# Patient Record
Sex: Female | Born: 1990 | Race: Black or African American | Hispanic: No | Marital: Single | Smoking: Former smoker
Health system: Southern US, Community
[De-identification: ages and names within clinical notes are randomized; demographics above are authoritative.]

## PROBLEM LIST (undated history)

## (undated) ENCOUNTER — Inpatient Hospital Stay (HOSPITAL_COMMUNITY): Payer: Self-pay

## (undated) DIAGNOSIS — N189 Chronic kidney disease, unspecified: Secondary | ICD-10-CM

## (undated) DIAGNOSIS — A599 Trichomoniasis, unspecified: Secondary | ICD-10-CM

## (undated) DIAGNOSIS — O2300 Infections of kidney in pregnancy, unspecified trimester: Secondary | ICD-10-CM

## (undated) DIAGNOSIS — B009 Herpesviral infection, unspecified: Secondary | ICD-10-CM

## (undated) HISTORY — PX: INDUCED ABORTION: SHX677

## (undated) HISTORY — DX: Herpesviral infection, unspecified: B00.9

---
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
## 2010-11-21 LAB — POCT RAPID STREP A (OFFICE): Streptococcus, Group A Screen (Direct): POSITIVE — AB

## 2010-12-29 NOTE — H&P (Signed)
NAME:  Jasmine Hudson, Jasmine Hudson NO.:  [id number]   MEDICAL RECORD NO.:  [id number]          PATIENT TYPE:  INP   LOCATION:  9172                          [facility]ILITY:  [facility]   PHYSICIAN:  Roseanna Rainbow, M.D.DATE OF BIRTH:  1990-12-13   DATE OF ADMISSION:  11/07/2005  DATE OF DISCHARGE:                                HISTORY & PHYSICAL   CHIEF COMPLAINT:  The patient is a 20 year old gravida 1, para 0 with an  estimated date of confinement of March 18 with an intrauterine pregnancy at  41+ weeks and elevated blood pressures for induction of labor.   HISTORY OF PRESENT ILLNESS:  Please see the above.  The patient denied any  neurological symptoms.   ALLERGIES:  No known drug allergies.   MEDICATIONS:  Prenatal vitamins.   RISK FACTORS:  Adolescent and she is GBS positive.   LABORATORIES:  Urine culture and sensitivity no growth.  One-hour GTT 93.  GBS positive on February 17.  Hepatitis B surface antigen negative.  Hemoglobin 11.8, hematocrit 35.1.  HIV nonreactive.  Platelets 301,000.  Blood type A+.  Antibody screen negative.  RPR nonreactive.  Rubella immune.  Sickle cell negative.  Ultrasound on February 6:  Estimated fetal weight  percentile 75th-90th percentile for 34 weeks, normal amniotic fluid index,  no previa.   PAST OB/GYN HISTORY:  Noncontributory.   PAST MEDICAL HISTORY:  No significant history of medical diseases.   PAST SURGICAL HISTORY:  No previous surgery.   SOCIAL HISTORY:  Does not give any significant history of alcohol usage.  Has no significant smoking history.  Denies illicit drug use.   FAMILY HISTORY:  Positive history of hypertension.   PHYSICAL EXAMINATION:  VITAL SIGNS:  Blood pressure 144/80, temperature  97.7, pulse 117.  Urine dip:  No proteinuria.  GENERAL:  Well-developed, well-nourished, no apparent distress.  ABDOMEN:  Gravid.  Cephalic presentation by Thayer Ohm.  PELVIC:  Sterile vaginal examination deferred secondary to  patient unable to  tolerate the examination.  Doppler fetal heart tones 130s.   ASSESSMENT:  Primigravida with an intrauterine pregnancy at 41+ weeks.  Rule  out gestational hypertension.  GBS positive.   PLAN:  Admission.  Induction of labor two-stage.  Will start with cervical  ripening.  GBS prophylaxis in labor.  Will also check a PIH panel.      Roseanna Rainbow, M.D.  Electronically Signed     LAJ/MEDQ  D:  11/07/2005  T:  11/07/2005  Job:  [id number]

## 2010-12-29 NOTE — Op Note (Signed)
NAME:  Jasmine Hudson, Jasmine Hudson NO.:  [id number]   MEDICAL RECORD NO.:  [id number]          PATIENT TYPE:  INP   LOCATION:  9101                          [facility]:  [facility]   PHYSICIAN:  Roseanna Rainbow, M.D.DATE OF BIRTH:  Feb 06, 1991   DATE OF PROCEDURE:  11/09/2005  DATE OF DISCHARGE:                                 OPERATIVE REPORT   PREOPERATIVE DIAGNOSIS:  Intrauterine pregnancy at 41+ weeks, protracted  latent phase, failed induction of labor.   POSTOPERATIVE DIAGNOSIS:  Intrauterine pregnancy at 41+ weeks, protracted  latent phase, failed induction of labor.   PROCEDURE:  Primary low uterine flap elliptical cesarean delivery via  Pfannenstiel skin incision.   SURGEON:  Jackson-Moore.   ANESTHESIA:  General tracheal.   COMPLICATIONS:  None.   ESTIMATED BLOOD LOSS:  800 mL.   IV FLUIDS/URINE OUTPUT:  As per anesthesiology.   PROCEDURES:  The patient was taken to the operating room and general  anesthetic was induced without difficulty.  She was then prepped and draped  in usual sterile fashion.  A Pfannenstiel skin incision was then made with  the scalpel and carried down to the underlying fascia with Bovie.  Fascia  was nicked in the midline.  The fascial incision was then extended  bilaterally with curved Mayo scissors.  The superior aspect of the fascial  incision was then tented up and the underlying rectus muscles dissected off.  The inferior aspect of the fascial incision was tented up and the rectus  muscles were dissected off.  The rectus muscles were separated in the  midline.  The parietal peritoneum was entered bluntly.  The peritoneal  incision was then extended superiorly and inferiorly with good visualization  of the bladder.  The bladder blade was then placed.  The vesicouterine  peritoneum was tented up and entered sharply with Metzenbaum scissors.  The  peritoneal incision was then extended bilaterally and the bladder flap  created  bluntly.  The bladder blade was then replaced, the lower uterine  segment was incised in a transverse fashion with the scalpel.  The uterine  incision was then extended with bandage scissors.  The infant's head was  delivered atraumatically.  The oropharynx was suctioned with bulb suction.  The cord was clamped and cut.  The infant was handed off to the waiting  neonatologist.  An umbilical artery pH was sent.  The placenta was then  removed.  The intrauterine cavity was evacuated of any remaining amniotic  fluid, clots and debris with moist laparotomy sponge.  The uterine incision  was then reapproximated in a running interlocking fashion with 0 Monocryl.  A second imbricating layer of the same suture was then placed.  Adequate  hemostasis was noted.  The paracolic gutters were then copiously irrigated.  The parietal peritoneum was reapproximated in a running fashion using 2-0  Vicryl.  The fascia was reapproximated  in a running fashion using 0 Vicryl.  The skin was reapproximated with  staples.  At the close of the procedure, the instrument and pack counts were  said to be correct x2.  The patient was awakened from general anesthesia and  taken to the PACU awake and in stable condition.      Roseanna Rainbow, M.D.  Electronically Signed     LAJ/MEDQ  D:  11/09/2005  T:  11/11/2005  Job:  [id number]
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
## 2011-05-03 LAB — POCT RAPID STREP A: Streptococcus, Group A Screen (Direct): NEGATIVE

## 2011-05-31 LAB — POCT RAPID STREP A: Streptococcus, Group A Screen (Direct): NEGATIVE

## 2011-07-04 ENCOUNTER — Encounter: Payer: Self-pay | Admitting: *Deleted

## 2011-07-04 DIAGNOSIS — S0100XA Unspecified open wound of scalp, initial encounter: Secondary | ICD-10-CM | POA: Insufficient documentation

## 2011-07-04 DIAGNOSIS — S0003XA Contusion of scalp, initial encounter: Secondary | ICD-10-CM | POA: Insufficient documentation

## 2011-07-04 DIAGNOSIS — S1093XA Contusion of unspecified part of neck, initial encounter: Secondary | ICD-10-CM | POA: Insufficient documentation
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
## 2011-07-04 NOTE — ED Notes (Signed)
Bleeding controlled while in triage

## 2011-07-05 ENCOUNTER — Emergency Department (HOSPITAL_BASED_OUTPATIENT_CLINIC_OR_DEPARTMENT_OTHER)
Admission: EM | Admit: 2011-07-05 | Discharge: 2011-07-05 | Disposition: A | Discharge status: Home / Self Care | Payer: Self-pay | Attending: Emergency Medicine | Admitting: Emergency Medicine

## 2011-07-05 DIAGNOSIS — T148XXA Other injury of unspecified body region, initial encounter: Secondary | ICD-10-CM

## 2011-07-05 DIAGNOSIS — S0093XA Contusion of unspecified part of head, initial encounter: Secondary | ICD-10-CM

## 2011-07-05 MED ORDER — TETANUS-DIPHTH-ACELL PERTUSSIS 5-2.5-18.5 LF-MCG/0.5 IM SUSP
0.5000 mL | Freq: Once | INTRAMUSCULAR | Status: DC
  Filled 2011-07-05: qty 0.5

## 2011-07-05 NOTE — ED Notes (Signed)
Pt left prior to receiving Tdap and discharge instructions.
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
## 2011-07-14 ENCOUNTER — Encounter (HOSPITAL_COMMUNITY): Payer: Self-pay | Admitting: *Deleted

## 2011-07-14 ENCOUNTER — Inpatient Hospital Stay (HOSPITAL_COMMUNITY): Payer: Self-pay

## 2011-07-14 ENCOUNTER — Inpatient Hospital Stay (HOSPITAL_COMMUNITY)
Admission: AD | Admit: 2011-07-14 | Discharge: 2011-07-14 | Disposition: A | Discharge status: Home / Self Care | Payer: Self-pay | Source: Ambulatory Visit | Attending: Obstetrics and Gynecology | Admitting: Obstetrics and Gynecology

## 2011-07-14 DIAGNOSIS — R109 Unspecified abdominal pain: Secondary | ICD-10-CM | POA: Insufficient documentation

## 2011-07-14 DIAGNOSIS — Z3490 Encounter for supervision of normal pregnancy, unspecified, unspecified trimester: Secondary | ICD-10-CM

## 2011-07-14 DIAGNOSIS — O2390 Unspecified genitourinary tract infection in pregnancy, unspecified trimester: Secondary | ICD-10-CM | POA: Insufficient documentation

## 2011-07-14 DIAGNOSIS — N390 Urinary tract infection, site not specified: Secondary | ICD-10-CM | POA: Insufficient documentation

## 2011-07-14 LAB — URINE MICROSCOPIC-ADD ON

## 2011-07-14 LAB — URINALYSIS, ROUTINE W REFLEX MICROSCOPIC
Glucose, UA: NEGATIVE mg/dL
Hgb urine dipstick: NEGATIVE
Ketones, ur: >80 mg/dL — AB
Nitrite: POSITIVE — AB
Protein, ur: 30 mg/dL — AB
Specific Gravity, Urine: >1.03 — ABNORMAL HIGH (ref 1.005–1.030)
Urobilinogen, UA: 4 mg/dL — ABNORMAL HIGH (ref 0.0–1.0)
pH: 6 (ref 5.0–8.0)

## 2011-07-14 LAB — CBC
HCT: 39.7 % (ref 36.0–46.0)
Hemoglobin: 13.7 g/dL (ref 12.0–15.0)
MCH: 30.2 pg (ref 26.0–34.0)
MCHC: 34.5 g/dL (ref 30.0–36.0)
MCV: 87.4 fL (ref 78.0–100.0)
Platelets: 292 10*3/uL (ref 150–400)
RBC: 4.54 MIL/uL (ref 3.87–5.11)
WBC: 8.6 10*3/uL (ref 4.0–10.5)

## 2011-07-14 LAB — ABO/RH: ABO/RH(D): A POS

## 2011-07-14 LAB — WET PREP, GENITAL: Yeast Wet Prep HPF POC: NONE SEEN

## 2011-07-14 LAB — HCG, QUANTITATIVE, PREGNANCY: hCG, Beta Chain, Quant, S: 88419 m[IU]/mL — ABNORMAL HIGH (ref <5)

## 2011-07-14 LAB — POCT PREGNANCY, URINE: Preg Test, Ur: POSITIVE

## 2011-07-14 MED ORDER — CEPHALEXIN 500 MG PO CAPS: 500.0000 mg | ORAL_CAPSULE | Freq: Four times a day (QID) | ORAL | Status: AC

## 2011-07-14 NOTE — ED Provider Notes (Signed)
Chief Complaint:  Abdominal Pain   Jasmine Hudson is  20 y.o. G2P1001.  Patient's last menstrual period was 05/27/2011.Marland Kitchen  Her pregnancy status is positive. [redacted]w[redacted]d   She presents complaining of Abdominal Pain . Onset is described as sudden and has been present for  1 days. Denies bleeding, vaginal discharge, back pain, dysuria, fever, chills, N/V/D  Obstetrical/Gynecological History: OB History    Grav Para Term Preterm Abortions TAB SAB Ect Mult Living   2 1 1  0      1      Past Medical History: Past Medical History  Diagnosis Date  . No pertinent past medical history     Past Surgical History: Past Surgical History  Procedure Date  . Cesarean section     Family History: No family history on file.  Social History: History  Substance Use Topics  . Smoking status: Passive Smoker  . Smokeless tobacco: Not on file  . Alcohol Use: No    Allergies: No Known Allergies  No prescriptions prior to admission    Review of Systems - Negative except what has been reviewed in the HPI  Physical Exam   Blood pressure 120/59, pulse 90, temperature 98.2 F (36.8 C), temperature source Oral, resp. rate 16, height 5\' 8"  (1.727 m), weight 83.553 kg (184 lb 3.2 oz), last menstrual period 05/27/2011, SpO2 98.00%.  General: General appearance - alert, well appearing, and in no distress, oriented to person, place, and time, overweight and comfortable appearing Mental status - alert, oriented to person, place, and time, normal mood, behavior, speech, dress, motor activity, and thought processes, affect appropriate to mood Abdomen - suprapubic tenderness Focused Gynecological Exam: VULVA: normal appearing vulva with no masses, tenderness or lesions, VAGINA: normal appearing vagina with normal color and discharge, no lesions, CERVIX: normal appearing cervix without discharge or lesions, UTERUS: uterus is normal shape, consistency and nontender, enlarged to 6 week's size, ADNEXA: normal adnexa  in size, nontender and no masses  Labs: Recent Results (from the past 24 hour(s))  URINALYSIS, ROUTINE W REFLEX MICROSCOPIC   Collection Time   07/14/11  2:35 PM      Component Value Range   Color, Urine YELLOW  YELLOW    APPearance CLOUDY (*) CLEAR    Specific Gravity, Urine >1.030 (*) 1.005 - 1.030    pH 6.0  5.0 - 8.0    Glucose, UA NEGATIVE  NEGATIVE (mg/dL)   Hgb urine dipstick NEGATIVE  NEGATIVE    Bilirubin Urine MODERATE (*) NEGATIVE    Ketones, ur >80 (*) NEGATIVE (mg/dL)   Protein, ur 30 (*) NEGATIVE (mg/dL)   Urobilinogen, UA 4.0 (*) 0.0 - 1.0 (mg/dL)   Nitrite POSITIVE (*) NEGATIVE    Leukocytes, UA TRACE (*) NEGATIVE   URINE MICROSCOPIC-ADD ON   Collection Time   07/14/11  2:35 PM      Component Value Range   Squamous Epithelial / LPF FEW (*) RARE    WBC, UA 0-2  <3 (WBC/hpf)   RBC / HPF 0-2  <3 (RBC/hpf)   Bacteria, UA MANY (*) RARE   POCT PREGNANCY, URINE   Collection Time   07/14/11  2:39 PM      Component Value Range   Preg Test, Ur POSITIVE    ABO/RH   Collection Time   07/14/11  3:00 PM      Component Value Range   ABO/RH(D) A POS    HCG, QUANTITATIVE, PREGNANCY   Collection Time   07/14/11  3:00  PM      Component Value Range   hCG, Beta Nyra Jabs, [place] [address] (*) <5 (mIU/mL)  CBC   Collection Time   07/14/11  3:01 PM      Component Value Range   WBC 8.6  4.0 - 10.5 (K/uL)   RBC 4.54  3.87 - 5.11 (MIL/uL)   Hemoglobin 13.7  12.0 - 15.0 (g/dL)   HCT 60.4  54.0 - 98.1 (%)   MCV 87.4  78.0 - 100.0 (fL)   MCH 30.2  26.0 - 34.0 (pg)   MCHC 34.5  30.0 - 36.0 (g/dL)   RDW 19.1  47.8 - 29.5 (%)   Platelets 292  150 - 400 (K/uL)  WET PREP, GENITAL   Collection Time   07/14/11  4:50 PM      Component Value Range   Yeast, Wet Prep NONE SEEN  NONE SEEN    Trich, Wet Prep NONE SEEN  NONE SEEN    Clue Cells, Wet Prep FEW (*) NONE SEEN    WBC, Wet Prep HPF POC MODERATE (*) NONE SEEN    Imaging Studies:  Viable IUP with cardiac activity    Assessment: Viable IUP UTI  Plan: Discharge home Rx Keflex sent to pharmacy Preg Verification letter given FU with OB/Gyn provider of choice for care  Kellianne Ek E. 07/14/2011,5:08 PM

## 2011-07-14 NOTE — Progress Notes (Signed)
Patient states she had a positive home pregnancy test about 2 weeks ago. Has started having lower abdominal pain. Has nausea and some vomiting. No bleeding.

## 2011-07-17 LAB — GC/CHLAMYDIA PROBE AMP, GENITAL: GC Probe Amp, Genital: NEGATIVE

## 2011-07-19 LAB — URINE CULTURE
Colony Count: >=100000
Culture  Setup Time: 201212012012

## 2011-07-23 NOTE — ED Provider Notes (Signed)
Attestation of Attending Supervision of Advanced Practitioner: Evaluation and management procedures were performed by the PA/NP/CNM/OB Fellow under my supervision/collaboration. Chart reviewed and agree with management and plan.  Kailene Steinhart V 07/23/2011 8:06 AM

## 2011-08-14 DIAGNOSIS — O2300 Infections of kidney in pregnancy, unspecified trimester: Secondary | ICD-10-CM

## 2011-08-14 HISTORY — DX: Infections of kidney in pregnancy, unspecified trimester: O23.00

## 2011-08-18 ENCOUNTER — Inpatient Hospital Stay (HOSPITAL_COMMUNITY)
Admission: AD | Admit: 2011-08-18 | Discharge: 2011-08-18 | Disposition: A | Discharge status: Home / Self Care | Payer: Self-pay | Source: Ambulatory Visit | Attending: Family Medicine | Admitting: Family Medicine

## 2011-08-18 DIAGNOSIS — A7489 Other chlamydial diseases: Secondary | ICD-10-CM

## 2011-08-18 DIAGNOSIS — O98319 Other infections with a predominantly sexual mode of transmission complicating pregnancy, unspecified trimester: Secondary | ICD-10-CM | POA: Insufficient documentation

## 2011-08-18 DIAGNOSIS — A5619 Other chlamydial genitourinary infection: Secondary | ICD-10-CM | POA: Insufficient documentation

## 2011-08-18 DIAGNOSIS — A749 Chlamydial infection, unspecified: Secondary | ICD-10-CM

## 2011-08-18 DIAGNOSIS — N739 Female pelvic inflammatory disease, unspecified: Secondary | ICD-10-CM | POA: Insufficient documentation

## 2011-08-18 MED ORDER — AZITHROMYCIN 1 G PO PACK: 1.0000 | PACK | Freq: Once | ORAL | Status: AC

## 2011-08-18 NOTE — ED Provider Notes (Signed)
Jasmine Hudson y.o.G2P1001 @[redacted]w[redacted]d   SUBJECTIVE  HPI: Positive Chlamydia culture from [facility] visit 07/14/2011. She presents with a certified letter saying  she needs treatment for Chlamydia. Her family member had not given her the message about the culture being positive. Not messing with the partner since Nov 7 but he was tested yesterday at Urgent Care. Waiting for [facility] card to start [facility].   Past Medical History  Diagnosis Date  . No pertinent past medical history    Past Surgical History  Procedure Date  . Cesarean section    History   Social History  . Marital Status: Single    Spouse Name: N/A    Number of Children: N/A  . Years of Education: N/A   Occupational History  . Not on file.   Social History Main Topics  . Smoking status: Passive Smoker  . Smokeless tobacco: Not on file  . Alcohol Use: No  . Drug Use: No  . Sexually Active: Yes   Other Topics Concern  . Not on file   Social History Narrative  . No narrative on file   No current facility-administered medications on file prior to encounter.   No current outpatient prescriptions on file prior to encounter.   No Known Allergies  ROS: Pertinent items in HPI  OBJECTIVE  BP 134/49  Pulse 90  Temp(Src) 98.5 F (36.9 C) (Oral)  Resp 18  Ht 5\' 7"  (1.702 m)  Wt 84.732 kg (186 lb 12.8 oz)  BMI 29.26 kg/m2  LMP 05/27/2011  Gen: NAD ABD: NT, DT FHR 150  ASSESSMENT  P1001 at [redacted]w[redacted]d Chlamydia  PLAN  RX ZIthro ADvised to treat partner

## 2011-08-18 NOTE — ED Notes (Signed)
D.Poe,CNM gave pt RX for chlamydia treatment FHT per doppler obtained.
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
## 2011-08-18 NOTE — ED Provider Notes (Signed)
Chart reviewed and agree with management and plan.  

## 2011-11-15 ENCOUNTER — Encounter (HOSPITAL_COMMUNITY): Payer: Self-pay | Admitting: *Deleted

## 2011-11-15 ENCOUNTER — Inpatient Hospital Stay (HOSPITAL_COMMUNITY)
Admission: AD | Admit: 2011-11-15 | Discharge: 2011-11-15 | Disposition: A | Discharge status: Home / Self Care | Payer: Self-pay | Source: Ambulatory Visit | Attending: Obstetrics & Gynecology | Admitting: Obstetrics & Gynecology

## 2011-11-15 DIAGNOSIS — R10814 Left lower quadrant abdominal tenderness: Secondary | ICD-10-CM

## 2011-11-15 DIAGNOSIS — O34219 Maternal care for unspecified type scar from previous cesarean delivery: Secondary | ICD-10-CM

## 2011-11-15 DIAGNOSIS — N949 Unspecified condition associated with female genital organs and menstrual cycle: Secondary | ICD-10-CM | POA: Insufficient documentation

## 2011-11-15 DIAGNOSIS — R1032 Left lower quadrant pain: Secondary | ICD-10-CM | POA: Insufficient documentation

## 2011-11-15 DIAGNOSIS — R300 Dysuria: Secondary | ICD-10-CM | POA: Insufficient documentation

## 2011-11-15 DIAGNOSIS — O99891 Other specified diseases and conditions complicating pregnancy: Secondary | ICD-10-CM | POA: Insufficient documentation

## 2011-11-15 DIAGNOSIS — O0930 Supervision of pregnancy with insufficient antenatal care, unspecified trimester: Secondary | ICD-10-CM | POA: Insufficient documentation

## 2011-11-15 LAB — WET PREP, GENITAL
Trich, Wet Prep: NONE SEEN
Yeast Wet Prep HPF POC: NONE SEEN

## 2011-11-15 LAB — URINALYSIS, ROUTINE W REFLEX MICROSCOPIC
Bilirubin Urine: NEGATIVE
Glucose, UA: NEGATIVE mg/dL
Hgb urine dipstick: NEGATIVE
Leukocytes, UA: NEGATIVE
Nitrite: POSITIVE — AB
Protein, ur: NEGATIVE mg/dL
Specific Gravity, Urine: 1.02 (ref 1.005–1.030)
Urobilinogen, UA: 0.2 mg/dL (ref 0.0–1.0)
pH: 6.5 (ref 5.0–8.0)

## 2011-11-15 LAB — URINE MICROSCOPIC-ADD ON

## 2011-11-15 MED ORDER — VALACYCLOVIR HCL 500 MG PO TABS
1000.0000 mg | ORAL_TABLET | Freq: Two times a day (BID) | ORAL | Status: DC
  Filled 2011-11-15 (×2): qty 2

## 2011-11-15 MED ORDER — CEPHALEXIN 500 MG PO CAPS
500.0000 mg | ORAL_CAPSULE | Freq: Two times a day (BID) | ORAL | Status: DC
  Filled 2011-11-15 (×2): qty 1
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
## 2011-11-15 NOTE — Discharge Instructions (Signed)
Please take your valcyclovir and Keflex as prescribed. Please call the health department to establish care for you and your baby. Please continue to work on getting approved for [facility]. Please come back if any of your symptoms get worse.

## 2011-11-15 NOTE — MAU Provider Note (Signed)
Patient seen also by me. Agree with note by Dr Margot Ables.

## 2011-11-16 LAB — GC/CHLAMYDIA PROBE AMP, GENITAL
Chlamydia, DNA Probe: NEGATIVE
GC Probe Amp, Genital: NEGATIVE

## 2011-11-19 LAB — HERPES SIMPLEX VIRUS CULTURE

## 2011-11-22 ENCOUNTER — Encounter: Payer: Self-pay | Admitting: Obstetrics & Gynecology

## 2011-11-22 DIAGNOSIS — B009 Herpesviral infection, unspecified: Secondary | ICD-10-CM | POA: Insufficient documentation

## 2011-12-06 ENCOUNTER — Ambulatory Visit (HOSPITAL_COMMUNITY)
Admission: RE | Admit: 2011-12-06 | Discharge: 2011-12-06 | Disposition: A | Discharge status: Home / Self Care | Payer: Self-pay | Source: Ambulatory Visit | Attending: Obstetrics and Gynecology | Admitting: Obstetrics and Gynecology

## 2011-12-06 ENCOUNTER — Ambulatory Visit (INDEPENDENT_AMBULATORY_CARE_PROVIDER_SITE_OTHER): Payer: Self-pay | Admitting: Obstetrics and Gynecology

## 2011-12-06 ENCOUNTER — Encounter: Payer: Self-pay | Admitting: Obstetrics and Gynecology

## 2011-12-06 VITALS — Temp 97.3°F | Wt 212.4 lb

## 2011-12-06 DIAGNOSIS — O98519 Other viral diseases complicating pregnancy, unspecified trimester: Secondary | ICD-10-CM | POA: Insufficient documentation

## 2011-12-06 DIAGNOSIS — A6000 Herpesviral infection of urogenital system, unspecified: Secondary | ICD-10-CM | POA: Insufficient documentation

## 2011-12-06 DIAGNOSIS — O98319 Other infections with a predominantly sexual mode of transmission complicating pregnancy, unspecified trimester: Secondary | ICD-10-CM

## 2011-12-06 DIAGNOSIS — O0930 Supervision of pregnancy with insufficient antenatal care, unspecified trimester: Secondary | ICD-10-CM | POA: Insufficient documentation

## 2011-12-06 DIAGNOSIS — Z3480 Encounter for supervision of other normal pregnancy, unspecified trimester: Secondary | ICD-10-CM

## 2011-12-06 DIAGNOSIS — Z1389 Encounter for screening for other disorder: Secondary | ICD-10-CM | POA: Insufficient documentation

## 2011-12-06 DIAGNOSIS — A749 Chlamydial infection, unspecified: Secondary | ICD-10-CM | POA: Insufficient documentation

## 2011-12-06 DIAGNOSIS — B009 Herpesviral infection, unspecified: Secondary | ICD-10-CM

## 2011-12-06 DIAGNOSIS — O34219 Maternal care for unspecified type scar from previous cesarean delivery: Secondary | ICD-10-CM | POA: Insufficient documentation

## 2011-12-06 DIAGNOSIS — O358XX0 Maternal care for other (suspected) fetal abnormality and damage, not applicable or unspecified: Secondary | ICD-10-CM | POA: Insufficient documentation

## 2011-12-06 DIAGNOSIS — Z363 Encounter for antenatal screening for malformations: Secondary | ICD-10-CM | POA: Insufficient documentation

## 2011-12-06 DIAGNOSIS — A568 Sexually transmitted chlamydial infection of other sites: Secondary | ICD-10-CM

## 2011-12-06 LAB — POCT URINALYSIS DIP (DEVICE)
Glucose, UA: NEGATIVE mg/dL
Hgb urine dipstick: NEGATIVE
Ketones, ur: NEGATIVE mg/dL
Protein, ur: NEGATIVE mg/dL
Specific Gravity, Urine: 1.02 (ref 1.005–1.030)

## 2011-12-06 NOTE — Progress Notes (Signed)
   Subjective:    Jasmine Hudson is a G2P1001 [redacted]w[redacted]d being seen today for her first obstetrical visit.  Her obstetrical history is significant for previous cesarean section secondary to failed induction. Patient present late to care. Prenatal care also complicated by chlamydia infection in first trimester and diagnosed with herpes genitalis. Patient did not take prescribed acyclovir and is not planning to do so unless she has another outbreak. Patient does intend to breast feed. Pregnancy history fully reviewed.  Patient reports occasional cramping pain.Ceasar Mons Vitals:   12/06/11 0829  BP: 117/76  Temp: 97.3 F (36.3 C)  Weight: 212 lb 6.4 oz (96.344 kg)    HISTORY: OB History    Grav Para Term Preterm Abortions TAB SAB Ect Mult Living   2 1 1  0      1     # Outc Date GA Lbr Len/2nd Wgt Sex Del Anes PTL Lv   1 TRM 3/07 [redacted]w[redacted]d  8lb10oz(3.912kg) F LTCS   Yes   2 CUR              Past Medical History  Diagnosis Date  . No pertinent past medical history    Past Surgical History  Procedure Date  . Cesarean section    Family History  Problem Relation Age of Onset  . Hypertension Mother      Exam    Uterus:     Pelvic Exam:    Perineum: No Hemorrhoids, Normal Perineum   Vulva: normal   Vagina:  normal mucosa, normal discharge   pH:    Cervix: closed, long, post, firm   Adnexa: difficult to assess due to gravid uterus   Bony Pelvis: adequate  System: Breast:  normal appearance, no masses or tenderness, No nipple discharge or bleeding, No axillary or supraclavicular adenopathy   Skin: normal coloration and turgor, no rashes    Neurologic: oriented, grossly non-focal   Extremities: normal strength, tone, and muscle mass, ROM of all joints is normal   HEENT extra ocular movement intact   Mouth/Teeth mucous membranes moist, pharynx normal without lesions   Neck supple and no masses   Cardiovascular: regular rate and rhythm   Respiratory:  chest clear, no wheezing,  crepitations, rhonchi, normal symmetric air entry   Abdomen: soft, gravid, nt   Urinary:       Assessment:    Pregnancy: G2P1001 Patient Active Problem List  Diagnoses  . Previous cesarean delivery affecting pregnancy, antepartum  . Herpes  . Chlamydia infection, current pregnancy        Plan:     Initial labs drawn. Prenatal vitamins. Problem list reviewed and updated. Genetic Screening discussed : too late.  Ultrasound discussed; fetal survey: requested and ordered  Patient to meet with social worker and nutritionist today.  Ob panel and 1 hr GCT today  Patient with previous LTCS and desires TOLAC. Risks, benefits and alternatives explained. Consent signed for trial of labor.  Follow up in 3 weeks. 30 min visit spent on counseling and coordination of care.    Graclynn Vanantwerp 12/06/2011

## 2011-12-06 NOTE — Progress Notes (Signed)
Nutrition Note:  (1st visit consult) Pt with HTN, late prenatal care and excessive wt gain. Pt reports pregravid wt of 165#, with a current gain of 47# @ [redacted]w[redacted]d gestation.  Pt admits to hearty appetite and constantly feeling hungry.  No nausea or vomiting reported and no food allergies.  Pt is not taking a prenatal vitamin.  Current intake reported includes a wide variety of all food groups, adequate water and limited caffeine.  Pt does plan to BF, and currently does not receive WIC services.   Pt concerned with complications in her Medicaid process and plans to follow up with DSS. Made WIC appt for May 7 @ 2:30. Pt agrees to be more aware of diet and intake.  Agrees to only eat out of hunger and not out of boredom.  Disc wt gain goals and increasing physical activity.  Follow up if referred.  Cy Blamer, RD

## 2011-12-06 NOTE — Progress Notes (Signed)
Pulse: 99. No pain. Pressure in pelvic. Vaginal d/c described as white; no itch, no odor.

## 2011-12-06 NOTE — Progress Notes (Signed)
U/S scheduled today at 3 pm. °

## 2011-12-06 NOTE — Patient Instructions (Signed)
Pregnancy - Third Trimester The third trimester of pregnancy (the last 3 months) is a period of the most rapid growth for you and your baby. The baby approaches a length of 20 inches and a weight of 6 to 10 pounds. The baby is adding on fat and getting ready for life outside your body. While inside, babies have periods of sleeping and waking, suck their thumbs, and hiccups. You can often feel small contractions of the uterus. This is false labor. It is also called Braxton-Hicks contractions. This is like a practice for labor. The usual problems in this stage of pregnancy include more difficulty breathing, swelling of the hands and feet from water retention, and having to urinate more often because of the uterus and baby pressing on your bladder.  PRENATAL EXAMS  Blood work may continue to be done during prenatal exams. These tests are done to check on your health and the probable health of your baby. Blood work is used to follow your blood levels (hemoglobin). Anemia (low hemoglobin) is common during pregnancy. Iron and vitamins are given to help prevent this. You may also continue to be checked for diabetes. Some of the past blood tests may be done again.   The size of the uterus is measured during each visit. This makes sure your baby is growing properly according to your pregnancy dates.   Your blood pressure is checked every prenatal visit. This is to make sure you are not getting toxemia.   Your urine is checked every prenatal visit for infection, diabetes and protein.   Your weight is checked at each visit. This is done to make sure gains are happening at the suggested rate and that you and your baby are growing normally.   Sometimes, an ultrasound is performed to confirm the position and the proper growth and development of the baby. This is a test done that bounces harmless sound waves off the baby so your caregiver can more accurately determine due dates.   Discuss the type of pain  medication and anesthesia you will have during your labor and delivery.   Discuss the possibility and anesthesia if a Cesarean Section might be necessary.   Inform your caregiver if there is any mental or physical violence at home.  Sometimes, a specialized non-stress test, contraction stress test and biophysical profile are done to make sure the baby is not having a problem. Checking the amniotic fluid surrounding the baby is called an amniocentesis. The amniotic fluid is removed by sticking a needle into the belly (abdomen). This is sometimes done near the end of pregnancy if an early delivery is required. In this case, it is done to help make sure the baby's lungs are mature enough for the baby to live outside of the womb. If the lungs are not mature and it is unsafe to deliver the baby, an injection of cortisone medication is given to the mother 1 to 2 days before the delivery. This helps the baby's lungs mature and makes it safer to deliver the baby. CHANGES OCCURING IN THE THIRD TRIMESTER OF PREGNANCY Your body goes through many changes during pregnancy. They vary from person to person. Talk to your caregiver about changes you notice and are concerned about.  During the last trimester, you have probably had an increase in your appetite. It is normal to have cravings for certain foods. This varies from person to person and pregnancy to pregnancy.   You may begin to get stretch marks on your hips,   abdomen, and breasts. These are normal changes in the body during pregnancy. There are no exercises or medications to take which prevent this change.   Constipation may be treated with a stool softener or adding bulk to your diet. Drinking lots of fluids, fiber in vegetables, fruits, and whole grains are helpful.   Exercising is also helpful. If you have been very active up until your pregnancy, most of these activities can be continued during your pregnancy. If you have been less active, it is helpful  to start an exercise program such as walking. Consult your caregiver before starting exercise programs.   Avoid all smoking, alcohol, un-prescribed drugs, herbs and "street drugs" during your pregnancy. These chemicals affect the formation and growth of the baby. Avoid chemicals throughout the pregnancy to ensure the delivery of a healthy infant.   Backache, varicose veins and hemorrhoids may develop or get worse.   You will tire more easily in the third trimester, which is normal.   The baby's movements may be stronger and more often.   You may become short of breath easily.   Your belly button may stick out.   A yellow discharge may leak from your breasts called colostrum.   You may have a bloody mucus discharge. This usually occurs a few days to a week before labor begins.  HOME CARE INSTRUCTIONS   Keep your caregiver's appointments. Follow your caregiver's instructions regarding medication use, exercise, and diet.   During pregnancy, you are providing food for you and your baby. Continue to eat regular, well-balanced meals. Choose foods such as meat, fish, milk and other low fat dairy products, vegetables, fruits, and whole-grain breads and cereals. Your caregiver will tell you of the ideal weight gain.   A physical sexual relationship may be continued throughout pregnancy if there are no other problems such as early (premature) leaking of amniotic fluid from the membranes, vaginal bleeding, or belly (abdominal) pain.   Exercise regularly if there are no restrictions. Check with your caregiver if you are unsure of the safety of your exercises. Greater weight gain will occur in the last 2 trimesters of pregnancy. Exercising helps:   Control your weight.   Get you in shape for labor and delivery.   You lose weight after you deliver.   Rest a lot with legs elevated, or as needed for leg cramps or low back pain.   Wear a good support or jogging bra for breast tenderness during  pregnancy. This may help if worn during sleep. Pads or tissues may be used in the bra if you are leaking colostrum.   Do not use hot tubs, steam rooms, or saunas.   Wear your seat belt when driving. This protects you and your baby if you are in an accident.   Avoid raw meat, cat litter boxes and soil used by cats. These carry germs that can cause birth defects in the baby.   It is easier to loose urine during pregnancy. Tightening up and strengthening the pelvic muscles will help with this problem. You can practice stopping your urination while you are going to the bathroom. These are the same muscles you need to strengthen. It is also the muscles you would use if you were trying to stop from passing gas. You can practice tightening these muscles up 10 times a set and repeating this about 3 times per day. Once you know what muscles to tighten up, do not perform these exercises during urination. It is more likely   to cause an infection by backing up the urine.   Ask for help if you have financial, counseling or nutritional needs during pregnancy. Your caregiver will be able to offer counseling for these needs as well as refer you for other special needs.   Make a list of emergency phone numbers and have them available.   Plan on getting help from family or friends when you go home from the hospital.   Make a trial run to the hospital.   Take prenatal classes with the father to understand, practice and ask questions about the labor and delivery.   Prepare the baby's room/nursery.   Do not travel out of the city unless it is absolutely necessary and with the advice of your caregiver.   Wear only low or no heal shoes to have better balance and prevent falling.  MEDICATIONS AND DRUG USE IN PREGNANCY  Take prenatal vitamins as directed. The vitamin should contain 1 milligram of folic acid. Keep all vitamins out of reach of children. Only a couple vitamins or tablets containing iron may be fatal  to a baby or young child when ingested.   Avoid use of all medications, including herbs, over-the-counter medications, not prescribed or suggested by your caregiver. Only take over-the-counter or prescription medicines for pain, discomfort, or fever as directed by your caregiver. Do not use aspirin, ibuprofen (Motrin, Advil, Nuprin) or naproxen (Aleve) unless OK'd by your caregiver.   Let your caregiver also know about herbs you may be using.   Alcohol is related to a number of birth defects. This includes fetal alcohol syndrome. All alcohol, in any form, should be avoided completely. Smoking will cause low birth rate and premature babies.   Street/illegal drugs are very harmful to the baby. They are absolutely forbidden. A baby born to an addicted mother will be addicted at birth. The baby will go through the same withdrawal an adult does.  SEEK MEDICAL CARE IF: You have any concerns or worries during your pregnancy. It is better to call with your questions if you feel they cannot wait, rather than worry about them. DECISIONS ABOUT CIRCUMCISION You may or may not know the sex of your baby. If you know your baby is a boy, it may be time to think about circumcision. Circumcision is the removal of the foreskin of the penis. This is the skin that covers the sensitive end of the penis. There is no proven medical need for this. Often this decision is made on what is popular at the time or based upon religious beliefs and social issues. You can discuss these issues with your caregiver or pediatrician. SEEK IMMEDIATE MEDICAL CARE IF:   An unexplained oral temperature above 102 F (38.9 C) develops, or as your caregiver suggests.   You have leaking of fluid from the vagina (birth canal). If leaking membranes are suspected, take your temperature and tell your caregiver of this when you call.   There is vaginal spotting, bleeding or passing clots. Tell your caregiver of the amount and how many pads are  used.   You develop a bad smelling vaginal discharge with a change in the color from clear to white.   You develop vomiting that lasts more than 24 hours.   You develop chills or fever.   You develop shortness of breath.   You develop burning on urination.   You loose more than 2 pounds of weight or gain more than 2 pounds of weight or as suggested by your   caregiver.   You notice sudden swelling of your face, hands, and feet or legs.   You develop belly (abdominal) pain. Round ligament discomfort is a common non-cancerous (benign) cause of abdominal pain in pregnancy. Your caregiver still must evaluate you.   You develop a severe headache that does not go away.   You develop visual problems, blurred or double vision.   If you have not felt your baby move for more than 1 hour. If you think the baby is not moving as much as usual, eat something with sugar in it and lie down on your left side for an hour. The baby should move at least 4 to 5 times per hour. Call right away if your baby moves less than that.   You fall, are in a car accident or any kind of trauma.   There is mental or physical violence at home.  Document Released: 07/24/2001 Document Revised: 07/19/2011 Document Reviewed: 01/26/2009 Exit[employer] Patient Information 2012 [employer]. 

## 2011-12-07 LAB — OBSTETRIC PANEL
Hemoglobin: 11.6 g/dL — ABNORMAL LOW (ref 12.0–15.0)
Hepatitis B Surface Ag: NEGATIVE
Lymphocytes Relative: 23 % (ref 12–46)
Lymphs Abs: 2 10*3/uL (ref 0.7–4.0)
Monocytes Relative: 5 % (ref 3–12)
Neutrophils Relative %: 70 % (ref 43–77)
Platelets: 300 10*3/uL (ref 150–400)
RBC: 3.94 MIL/uL (ref 3.87–5.11)
Rubella: 14.8 IU/mL — ABNORMAL HIGH
WBC: 8.5 10*3/uL (ref 4.0–10.5)

## 2011-12-09 LAB — CULTURE, OB URINE: Colony Count: >=100000

## 2011-12-11 DIAGNOSIS — Z3480 Encounter for supervision of other normal pregnancy, unspecified trimester: Secondary | ICD-10-CM | POA: Insufficient documentation

## 2011-12-11 MED ORDER — CEPHALEXIN 500 MG PO CAPS: 500.0000 mg | ORAL_CAPSULE | Freq: Four times a day (QID) | ORAL | Status: AC

## 2011-12-11 NOTE — Progress Notes (Signed)
Addended by: Catalina Antigua on: 12/11/2011 08:40 AM   Modules accepted: Orders

## 2011-12-13 ENCOUNTER — Telehealth: Payer: Self-pay | Admitting: Gynecology

## 2011-12-13 NOTE — Telephone Encounter (Signed)
Message copied by Marylyn Ishihara on Thu Dec 13, 2011 10:42 AM ------      Message from: Jasmine Hudson      Created: Tue Dec 11, 2011  8:36 AM       Please inform patient that keflex has been e-prescribed to treat her UTI            Gigi Gin

## 2011-12-26 ENCOUNTER — Encounter: Payer: Self-pay | Admitting: Obstetrics & Gynecology

## 2011-12-26 ENCOUNTER — Encounter: Payer: Self-pay | Admitting: Family

## 2012-01-02 ENCOUNTER — Ambulatory Visit (INDEPENDENT_AMBULATORY_CARE_PROVIDER_SITE_OTHER): Payer: Self-pay | Admitting: Family

## 2012-01-02 VITALS — BP 132/81 | Temp 97.9°F | Wt 219.7 lb

## 2012-01-02 DIAGNOSIS — O26839 Pregnancy related renal disease, unspecified trimester: Secondary | ICD-10-CM

## 2012-01-02 DIAGNOSIS — N390 Urinary tract infection, site not specified: Secondary | ICD-10-CM

## 2012-01-02 DIAGNOSIS — O2390 Unspecified genitourinary tract infection in pregnancy, unspecified trimester: Secondary | ICD-10-CM

## 2012-01-02 DIAGNOSIS — O2340 Unspecified infection of urinary tract in pregnancy, unspecified trimester: Secondary | ICD-10-CM

## 2012-01-02 DIAGNOSIS — R809 Proteinuria, unspecified: Secondary | ICD-10-CM

## 2012-01-02 DIAGNOSIS — N289 Disorder of kidney and ureter, unspecified: Secondary | ICD-10-CM

## 2012-01-02 DIAGNOSIS — O1210 Gestational proteinuria, unspecified trimester: Secondary | ICD-10-CM

## 2012-01-02 DIAGNOSIS — Z3480 Encounter for supervision of other normal pregnancy, unspecified trimester: Secondary | ICD-10-CM

## 2012-01-02 DIAGNOSIS — O34219 Maternal care for unspecified type scar from previous cesarean delivery: Secondary | ICD-10-CM

## 2012-01-02 LAB — POCT URINALYSIS DIP (DEVICE)
Ketones, ur: NEGATIVE mg/dL
Protein, ur: 100 mg/dL — AB
Specific Gravity, Urine: 1.02 (ref 1.005–1.030)
pH: 7.5 (ref 5.0–8.0)

## 2012-01-02 NOTE — Progress Notes (Signed)
Reports 4 Braxton Hick's a day; no bleeding or leaking of fluid; 100+protein, no PIH symptoms>collect 24 hr urine and labs for baseline (no preexisting conditions).  No UTI symptoms, urine culture for test of cure for treated UTI on 12/06/11.

## 2012-01-02 NOTE — Progress Notes (Signed)
Pulse 94 Pt has noticed an increase in her vaginal discharge, no odor or itching,.

## 2012-01-04 ENCOUNTER — Other Ambulatory Visit: Payer: Self-pay

## 2012-01-04 LAB — CBC
HCT: 34 % — ABNORMAL LOW (ref 36.0–46.0)
MCHC: 32.9 g/dL (ref 30.0–36.0)
MCV: 86.1 fL (ref 78.0–100.0)
RDW: 14.3 % (ref 11.5–15.5)

## 2012-01-04 LAB — COMPREHENSIVE METABOLIC PANEL
ALT: 10 U/L (ref 0–35)
AST: 15 U/L (ref 0–37)
Calcium: 8.9 mg/dL (ref 8.4–10.5)
Chloride: 106 mEq/L (ref 96–112)
Creat: 0.52 mg/dL (ref 0.50–1.10)
Sodium: 140 mEq/L (ref 135–145)

## 2012-01-04 NOTE — Progress Notes (Signed)
Addended by: Doreen Salvage on: 01/04/2012 11:44 AM   Modules accepted: Orders

## 2012-01-05 LAB — PROTEIN, URINE, 24 HOUR
Protein, 24H Urine: 280 mg/d — ABNORMAL HIGH (ref 50–100)
Protein, Urine: 14 mg/dL

## 2012-01-06 LAB — CULTURE, OB URINE: Colony Count: >=100000

## 2012-01-09 ENCOUNTER — Ambulatory Visit (INDEPENDENT_AMBULATORY_CARE_PROVIDER_SITE_OTHER): Payer: Self-pay | Admitting: Family Medicine

## 2012-01-09 VITALS — BP 112/71 | Temp 97.8°F | Wt 222.7 lb

## 2012-01-09 DIAGNOSIS — N390 Urinary tract infection, site not specified: Secondary | ICD-10-CM

## 2012-01-09 DIAGNOSIS — O2340 Unspecified infection of urinary tract in pregnancy, unspecified trimester: Secondary | ICD-10-CM

## 2012-01-09 DIAGNOSIS — O2390 Unspecified genitourinary tract infection in pregnancy, unspecified trimester: Secondary | ICD-10-CM

## 2012-01-09 LAB — POCT URINALYSIS DIP (DEVICE)
Bilirubin Urine: NEGATIVE
Nitrite: NEGATIVE
Protein, ur: 100 mg/dL — AB
pH: 7 (ref 5.0–8.0)

## 2012-01-09 NOTE — Progress Notes (Signed)
Trace edema, round ligament pains.  No other concerns.  No contractions, bleeding, abnormal vaginal discharge.  F/u in 2 weeks.

## 2012-01-09 NOTE — Patient Instructions (Signed)
Pregnancy - Third Trimester The third trimester begins at the 28th week of pregnancy and ends at birth. It is important to follow your doctor's instructions. HOME CARE   Go to your doctor's visits.   Do not smoke.   Do not drink alcohol or use drugs.   Only take medicine as told by your doctor.   Take prenatal vitamins as told. The vitamin should contain 1 milligram of folic acid.   Exercise.   Eat healthy foods. Eat regular, well-balanced meals.   You can have sex (intercourse) if there are no other problems with the pregnancy.   Do not use hot tubs, steam rooms, or saunas.   Wear a seat belt while driving.   Avoid raw meat, uncooked cheese, and litter boxes and soil used by cats.   Rest with your legs raised (elevated).   Make a list of emergency phone numbers. Keep this list with you.   Arrange for help when you come back home after delivering the baby.   Make a trial run to the hospital.   Take prenatal classes.   Prepare the baby's nursery.   Do not travel out of the city. If you absolutely have to, get permission from your doctor first.   Wear flat shoes. Do not wear high heels.  GET HELP RIGHT AWAY IF:   You have a temperature by mouth above 102 F (38.9 C), not controlled by medicine.   You have not felt the baby move for more than 1 hour. If you think the baby is not moving as much as normal, eat something with sugar in it or lie down on your left side for an hour. The baby should move at least 4 to 5 times per hour.   Fluid is coming from the vagina.   Blood is coming from the vagina. Light spotting is common, especially after sex (intercourse).   You have belly (abdominal) pain.   You have a bad smelling fluid (discharge) coming from the vagina. The fluid changes from clear to white.   You still feel sick to your stomach (nauseous).   You throw up (vomit) for more than 24 hours.   You have the chills.   You have shortness of breath.   You  have a burning feeling when you pee (urinate).   You lose or gain more than 2 pounds (0.9 kilograms) of weight over a week, or as told by your doctor.   Your face, hands, feet, or legs get puffy (swell).   You have a bad headache that will not go away.   You start to have problems seeing (blurry or double vision).   You fall, are in a car accident, or have any kind of trauma.   There is mental or physical violence at home.   You have any concerns or worries during your pregnancy.  MAKE SURE YOU:   Understand these instructions.   Will watch your condition.   Will get help right away if you are not doing well or get worse.  Document Released: 10/24/2009 Document Revised: 07/19/2011 Document Reviewed: 10/24/2009 Exit[employer] Patient Information 2012 [employer]. 

## 2012-01-09 NOTE — Progress Notes (Signed)
Pulse: 106. 2nd BP- 112/71    edema trace in hands, legs, feet. C/o sharp pains in pelvic. Pressure in pelvic. Vaginal d/c as thin white; no odor, no itch.

## 2012-01-10 ENCOUNTER — Encounter: Payer: Self-pay | Admitting: Family

## 2012-01-10 ENCOUNTER — Other Ambulatory Visit: Payer: Self-pay | Admitting: Family

## 2012-01-10 MED ORDER — CEPHALEXIN 500 MG PO CAPS: 500.0000 mg | ORAL_CAPSULE | Freq: Four times a day (QID) | ORAL | Status: AC

## 2012-01-10 NOTE — Progress Notes (Signed)
Pt urine culture positive for E.Coli again>put on Macrobid HS.

## 2012-01-23 ENCOUNTER — Encounter: Payer: Self-pay | Admitting: Obstetrics and Gynecology

## 2012-01-28 ENCOUNTER — Ambulatory Visit (INDEPENDENT_AMBULATORY_CARE_PROVIDER_SITE_OTHER): Payer: Self-pay | Admitting: Family

## 2012-01-28 VITALS — BP 131/75 | Temp 96.9°F | Wt 223.2 lb

## 2012-01-28 DIAGNOSIS — O98319 Other infections with a predominantly sexual mode of transmission complicating pregnancy, unspecified trimester: Secondary | ICD-10-CM

## 2012-01-28 DIAGNOSIS — A749 Chlamydial infection, unspecified: Secondary | ICD-10-CM

## 2012-01-28 DIAGNOSIS — A568 Sexually transmitted chlamydial infection of other sites: Secondary | ICD-10-CM

## 2012-01-28 DIAGNOSIS — O2340 Unspecified infection of urinary tract in pregnancy, unspecified trimester: Secondary | ICD-10-CM

## 2012-01-28 DIAGNOSIS — Z3480 Encounter for supervision of other normal pregnancy, unspecified trimester: Secondary | ICD-10-CM

## 2012-01-28 DIAGNOSIS — B009 Herpesviral infection, unspecified: Secondary | ICD-10-CM

## 2012-01-28 LAB — POCT URINALYSIS DIP (DEVICE)
Nitrite: POSITIVE — AB
Protein, ur: NEGATIVE mg/dL
Urobilinogen, UA: 1 mg/dL (ref 0.0–1.0)

## 2012-01-28 MED ORDER — KEFLEX 500 MG PO CAPS: 500.0000 mg | ORAL_CAPSULE | Freq: Two times a day (BID) | ORAL | Status: AC

## 2012-01-28 NOTE — Progress Notes (Signed)
Pt is low risk seen in HR clinic due to overflow; increased feet swelling, recommended increased water and elevation;  GBS, GC/CT to be done at next visit.  +Nitrites in urine, RX Keflex sent to pharmacy, urine culture completed; pt has decided she is considering repeat csection, explained soonest it can be completed is 02/24/12.

## 2012-01-28 NOTE — Progress Notes (Signed)
Pulse- 97 Edema- feet  Pain/pressure

## 2012-02-04 ENCOUNTER — Encounter: Payer: Self-pay | Admitting: Advanced Practice Midwife

## 2012-02-06 ENCOUNTER — Encounter: Payer: Self-pay | Admitting: Family Medicine

## 2012-02-06 ENCOUNTER — Encounter: Payer: Self-pay | Admitting: Physician Assistant

## 2012-02-13 ENCOUNTER — Ambulatory Visit (INDEPENDENT_AMBULATORY_CARE_PROVIDER_SITE_OTHER): Payer: Self-pay | Admitting: Advanced Practice Midwife

## 2012-02-13 ENCOUNTER — Encounter: Payer: Self-pay | Admitting: Advanced Practice Midwife

## 2012-02-13 VITALS — BP 118/68 | Temp 97.3°F | Wt 227.6 lb

## 2012-02-13 DIAGNOSIS — O34219 Maternal care for unspecified type scar from previous cesarean delivery: Secondary | ICD-10-CM

## 2012-02-13 DIAGNOSIS — O26839 Pregnancy related renal disease, unspecified trimester: Secondary | ICD-10-CM

## 2012-02-13 DIAGNOSIS — Z3480 Encounter for supervision of other normal pregnancy, unspecified trimester: Secondary | ICD-10-CM

## 2012-02-13 DIAGNOSIS — O98319 Other infections with a predominantly sexual mode of transmission complicating pregnancy, unspecified trimester: Secondary | ICD-10-CM

## 2012-02-13 LAB — POCT URINALYSIS DIP (DEVICE)
Hgb urine dipstick: NEGATIVE
Protein, ur: NEGATIVE mg/dL
Specific Gravity, Urine: 1.015 (ref 1.005–1.030)
Urobilinogen, UA: 1 mg/dL (ref 0.0–1.0)

## 2012-02-13 NOTE — Progress Notes (Signed)
Doing well. Does a lot of housework and hair styling. Back care tips reviewed. Still undecided re: C/S. Will decide by next week

## 2012-02-13 NOTE — Progress Notes (Signed)
Pulse 88  No c/o pain; pressure in vaginal area. Vaginal d/c stated as thin white; no itch, no odor.

## 2012-02-13 NOTE — Patient Instructions (Signed)
Normal Labor and Delivery Your caregiver must first be sure you are in labor. Signs of labor include:  You may pass what is called "the mucus plug" before labor begins. This is a small amount of blood stained mucus.   Regular uterine contractions.   The time between contractions get closer together.   The discomfort and pain gradually gets more intense.   Pains are mostly located in the back.   Pains get worse when walking.   The cervix (the opening of the uterus becomes thinner (begins to efface) and opens up (dilates).  Once you are in labor and admitted into the hospital or care center, your caregiver will do the following:  A complete physical examination.   Check your vital signs (blood pressure, pulse, temperature and the fetal heart rate).   Do a vaginal examination (using a sterile glove and lubricant) to determine:   The position (presentation) of the baby (head [vertex] or buttock first).   The level (station) of the baby's head in the birth canal.   The effacement and dilatation of the cervix.   You may have your pubic hair shaved and be given an enema depending on your caregiver and the circumstance.   An electronic monitor is usually placed on your abdomen. The monitor follows the length and intensity of the contractions, as well as the baby's heart rate.   Usually, your caregiver will insert an IV in your arm with a bottle of sugar water. This is done as a precaution so that medications can be given to you quickly during labor or delivery.  NORMAL LABOR AND DELIVERY IS DIVIDED UP INTO 3 STAGES: First Stage This is when regular contractions begin and the cervix begins to efface and dilate. This stage can last from 3 to 15 hours. The end of the first stage is when the cervix is 100% effaced and 10 centimeters dilated. Pain medications may be given by   Injection (morphine, demerol, etc.)   Regional anesthesia (spinal, caudal or epidural, anesthetics given in  different locations of the spine). Paracervical pain medication may be given, which is an injection of and anesthetic on each side of the cervix.  A pregnant woman may request to have "Natural Childbirth" which is not to have any medications or anesthesia during her labor and delivery. Second Stage This is when the baby comes down through the birth canal (vagina) and is born. This can take 1 to 4 hours. As the baby's head comes down through the birth canal, you may feel like you are going to have a bowel movement. You will get the urge to bear down and push until the baby is delivered. As the baby's head is being delivered, the caregiver will decide if an episiotomy (a cut in the perineum and vagina area) is needed to prevent tearing of the tissue in this area. The episiotomy is sewn up after the delivery of the baby and placenta. Sometimes a mask with nitrous oxide is given for the mother to breath during the delivery of the baby to help if there is too much pain. The end of Stage 2 is when the baby is fully delivered. Then when the umbilical cord stops pulsating it is clamped and cut. Third Stage The third stage begins after the baby is completely delivered and ends after the placenta (afterbirth) is delivered. This usually takes 5 to 30 minutes. After the placenta is delivered, a medication is given either by intravenous or injection to help contract   the uterus and prevent bleeding. The third stage is not painful and pain medication is usually not necessary. If an episiotomy was done, it is repaired at this time. After the delivery, the mother is watched and monitored closely for 1 to 2 hours to make sure there is no postpartum bleeding (hemorrhage). If there is a lot of bleeding, medication is given to contract the uterus and stop the bleeding. Document Released: 05/08/2008 Document Revised: 07/19/2011 Document Reviewed: 05/08/2008 Exit[employer] Patient Information 2012 [employer]. 

## 2012-02-14 LAB — GC/CHLAMYDIA PROBE AMP, GENITAL: Chlamydia, DNA Probe: NEGATIVE

## 2012-02-18 NOTE — Progress Notes (Signed)
Group B Strep is POSITIVE

## 2012-02-20 ENCOUNTER — Encounter: Payer: Self-pay | Admitting: Physician Assistant

## 2012-02-27 ENCOUNTER — Encounter: Payer: Self-pay | Admitting: Family Medicine

## 2012-02-27 ENCOUNTER — Ambulatory Visit (INDEPENDENT_AMBULATORY_CARE_PROVIDER_SITE_OTHER): Payer: Self-pay | Admitting: Advanced Practice Midwife

## 2012-02-27 VITALS — BP 124/78 | Temp 96.9°F | Wt 232.3 lb

## 2012-02-27 DIAGNOSIS — O2390 Unspecified genitourinary tract infection in pregnancy, unspecified trimester: Secondary | ICD-10-CM

## 2012-02-27 DIAGNOSIS — O98819 Other maternal infectious and parasitic diseases complicating pregnancy, unspecified trimester: Secondary | ICD-10-CM

## 2012-02-27 DIAGNOSIS — O98319 Other infections with a predominantly sexual mode of transmission complicating pregnancy, unspecified trimester: Secondary | ICD-10-CM

## 2012-02-27 DIAGNOSIS — O26839 Pregnancy related renal disease, unspecified trimester: Secondary | ICD-10-CM

## 2012-02-27 DIAGNOSIS — O34219 Maternal care for unspecified type scar from previous cesarean delivery: Secondary | ICD-10-CM

## 2012-02-27 DIAGNOSIS — A568 Sexually transmitted chlamydial infection of other sites: Secondary | ICD-10-CM

## 2012-02-27 LAB — POCT URINALYSIS DIP (DEVICE)
Hgb urine dipstick: NEGATIVE
Nitrite: NEGATIVE
Urobilinogen, UA: 0.2 mg/dL (ref 0.0–1.0)
pH: 7.5 (ref 5.0–8.0)

## 2012-02-27 NOTE — Progress Notes (Signed)
Pulse- 92  Edema-hands/feet  Pain/pressure- lower abd, vaginal  Pt c/o possible water broke on 02/26/12

## 2012-02-27 NOTE — Progress Notes (Signed)
Cervix FT/50/high. Not ruptured.  Wants to do Rpt C/S. mssge sent to [place] to schedule.

## 2012-02-27 NOTE — Patient Instructions (Signed)
Pregnancy - Third Trimester The third trimester of pregnancy (the last 3 months) is a period of the most rapid growth for you and your baby. The baby approaches a length of 20 inches and a weight of 6 to 10 pounds. The baby is adding on fat and getting ready for life outside your body. While inside, babies have periods of sleeping and waking, suck their thumbs, and hiccups. You can often feel small contractions of the uterus. This is false labor. It is also called Braxton-Hicks contractions. This is like a practice for labor. The usual problems in this stage of pregnancy include more difficulty breathing, swelling of the hands and feet from water retention, and having to urinate more often because of the uterus and baby pressing on your bladder.  PRENATAL EXAMS  Blood work may continue to be done during prenatal exams. These tests are done to check on your health and the probable health of your baby. Blood work is used to follow your blood levels (hemoglobin). Anemia (low hemoglobin) is common during pregnancy. Iron and vitamins are given to help prevent this. You may also continue to be checked for diabetes. Some of the past blood tests may be done again.   The size of the uterus is measured during each visit. This makes sure your baby is growing properly according to your pregnancy dates.   Your blood pressure is checked every prenatal visit. This is to make sure you are not getting toxemia.   Your urine is checked every prenatal visit for infection, diabetes and protein.   Your weight is checked at each visit. This is done to make sure gains are happening at the suggested rate and that you and your baby are growing normally.   Sometimes, an ultrasound is performed to confirm the position and the proper growth and development of the baby. This is a test done that bounces harmless sound waves off the baby so your caregiver can more accurately determine due dates.   Discuss the type of pain  medication and anesthesia you will have during your labor and delivery.   Discuss the possibility and anesthesia if a Cesarean Section might be necessary.   Inform your caregiver if there is any mental or physical violence at home.  Sometimes, a specialized non-stress test, contraction stress test and biophysical profile are done to make sure the baby is not having a problem. Checking the amniotic fluid surrounding the baby is called an amniocentesis. The amniotic fluid is removed by sticking a needle into the belly (abdomen). This is sometimes done near the end of pregnancy if an early delivery is required. In this case, it is done to help make sure the baby's lungs are mature enough for the baby to live outside of the womb. If the lungs are not mature and it is unsafe to deliver the baby, an injection of cortisone medication is given to the mother 1 to 2 days before the delivery. This helps the baby's lungs mature and makes it safer to deliver the baby. CHANGES OCCURING IN THE THIRD TRIMESTER OF PREGNANCY Your body goes through many changes during pregnancy. They vary from person to person. Talk to your caregiver about changes you notice and are concerned about.  During the last trimester, you have probably had an increase in your appetite. It is normal to have cravings for certain foods. This varies from person to person and pregnancy to pregnancy.   You may begin to get stretch marks on your hips,   abdomen, and breasts. These are normal changes in the body during pregnancy. There are no exercises or medications to take which prevent this change.   Constipation may be treated with a stool softener or adding bulk to your diet. Drinking lots of fluids, fiber in vegetables, fruits, and whole grains are helpful.   Exercising is also helpful. If you have been very active up until your pregnancy, most of these activities can be continued during your pregnancy. If you have been less active, it is helpful  to start an exercise program such as walking. Consult your caregiver before starting exercise programs.   Avoid all smoking, alcohol, un-prescribed drugs, herbs and "street drugs" during your pregnancy. These chemicals affect the formation and growth of the baby. Avoid chemicals throughout the pregnancy to ensure the delivery of a healthy infant.   Backache, varicose veins and hemorrhoids may develop or get worse.   You will tire more easily in the third trimester, which is normal.   The baby's movements may be stronger and more often.   You may become short of breath easily.   Your belly button may stick out.   A yellow discharge may leak from your breasts called colostrum.   You may have a bloody mucus discharge. This usually occurs a few days to a week before labor begins.  HOME CARE INSTRUCTIONS   Keep your caregiver's appointments. Follow your caregiver's instructions regarding medication use, exercise, and diet.   During pregnancy, you are providing food for you and your baby. Continue to eat regular, well-balanced meals. Choose foods such as meat, fish, milk and other low fat dairy products, vegetables, fruits, and whole-grain breads and cereals. Your caregiver will tell you of the ideal weight gain.   A physical sexual relationship may be continued throughout pregnancy if there are no other problems such as early (premature) leaking of amniotic fluid from the membranes, vaginal bleeding, or belly (abdominal) pain.   Exercise regularly if there are no restrictions. Check with your caregiver if you are unsure of the safety of your exercises. Greater weight gain will occur in the last 2 trimesters of pregnancy. Exercising helps:   Control your weight.   Get you in shape for labor and delivery.   You lose weight after you deliver.   Rest a lot with legs elevated, or as needed for leg cramps or low back pain.   Wear a good support or jogging bra for breast tenderness during  pregnancy. This may help if worn during sleep. Pads or tissues may be used in the bra if you are leaking colostrum.   Do not use hot tubs, steam rooms, or saunas.   Wear your seat belt when driving. This protects you and your baby if you are in an accident.   Avoid raw meat, cat litter boxes and soil used by cats. These carry germs that can cause birth defects in the baby.   It is easier to loose urine during pregnancy. Tightening up and strengthening the pelvic muscles will help with this problem. You can practice stopping your urination while you are going to the bathroom. These are the same muscles you need to strengthen. It is also the muscles you would use if you were trying to stop from passing gas. You can practice tightening these muscles up 10 times a set and repeating this about 3 times per day. Once you know what muscles to tighten up, do not perform these exercises during urination. It is more likely   to cause an infection by backing up the urine.   Ask for help if you have financial, counseling or nutritional needs during pregnancy. Your caregiver will be able to offer counseling for these needs as well as refer you for other special needs.   Make a list of emergency phone numbers and have them available.   Plan on getting help from family or friends when you go home from the hospital.   Make a trial run to the hospital.   Take prenatal classes with the father to understand, practice and ask questions about the labor and delivery.   Prepare the baby's room/nursery.   Do not travel out of the city unless it is absolutely necessary and with the advice of your caregiver.   Wear only low or no heal shoes to have better balance and prevent falling.  MEDICATIONS AND DRUG USE IN PREGNANCY  Take prenatal vitamins as directed. The vitamin should contain 1 milligram of folic acid. Keep all vitamins out of reach of children. Only a couple vitamins or tablets containing iron may be fatal  to a baby or young child when ingested.   Avoid use of all medications, including herbs, over-the-counter medications, not prescribed or suggested by your caregiver. Only take over-the-counter or prescription medicines for pain, discomfort, or fever as directed by your caregiver. Do not use aspirin, ibuprofen (Motrin, Advil, Nuprin) or naproxen (Aleve) unless OK'd by your caregiver.   Let your caregiver also know about herbs you may be using.   Alcohol is related to a number of birth defects. This includes fetal alcohol syndrome. All alcohol, in any form, should be avoided completely. Smoking will cause low birth rate and premature babies.   Street/illegal drugs are very harmful to the baby. They are absolutely forbidden. A baby born to an addicted mother will be addicted at birth. The baby will go through the same withdrawal an adult does.  SEEK MEDICAL CARE IF: You have any concerns or worries during your pregnancy. It is better to call with your questions if you feel they cannot wait, rather than worry about them. DECISIONS ABOUT CIRCUMCISION You may or may not know the sex of your baby. If you know your baby is a boy, it may be time to think about circumcision. Circumcision is the removal of the foreskin of the penis. This is the skin that covers the sensitive end of the penis. There is no proven medical need for this. Often this decision is made on what is popular at the time or based upon religious beliefs and social issues. You can discuss these issues with your caregiver or pediatrician. SEEK IMMEDIATE MEDICAL CARE IF:   An unexplained oral temperature above 102 F (38.9 C) develops, or as your caregiver suggests.   You have leaking of fluid from the vagina (birth canal). If leaking membranes are suspected, take your temperature and tell your caregiver of this when you call.   There is vaginal spotting, bleeding or passing clots. Tell your caregiver of the amount and how many pads are  used.   You develop a bad smelling vaginal discharge with a change in the color from clear to white.   You develop vomiting that lasts more than 24 hours.   You develop chills or fever.   You develop shortness of breath.   You develop burning on urination.   You loose more than 2 pounds of weight or gain more than 2 pounds of weight or as suggested by your   caregiver.   You notice sudden swelling of your face, hands, and feet or legs.   You develop belly (abdominal) pain. Round ligament discomfort is a common non-cancerous (benign) cause of abdominal pain in pregnancy. Your caregiver still must evaluate you.   You develop a severe headache that does not go away.   You develop visual problems, blurred or double vision.   If you have not felt your baby move for more than 1 hour. If you think the baby is not moving as much as usual, eat something with sugar in it and lie down on your left side for an hour. The baby should move at least 4 to 5 times per hour. Call right away if your baby moves less than that.   You fall, are in a car accident or any kind of trauma.   There is mental or physical violence at home.  Document Released: 07/24/2001 Document Revised: 07/19/2011 Document Reviewed: 01/26/2009 Exit[employer] Patient Information 2012 [employer]. 

## 2012-02-27 NOTE — Progress Notes (Signed)
Is experiencing cramping. Did experience gush of fluid last night around 1am, that had white particles in it. No fluid leaking since then. Began cramping Sunday q8 mins. Now getting cramps q15 minutes. Cramps are painful. Feels the same as when she was in labor with her first child. No bleeding, feels baby moving. Wants to get a c/s this time. Is still considering options for birth control after she delivers.  Exam by Wynelle Bourgeois shows 1cm dilation, 50% effacement, -3 station. Pooling negative, ferning negative. Will schedule for c/s.

## 2012-02-28 ENCOUNTER — Encounter (HOSPITAL_COMMUNITY): Payer: Self-pay

## 2012-02-28 ENCOUNTER — Encounter (HOSPITAL_COMMUNITY): Payer: Self-pay | Admitting: Pharmacist

## 2012-02-28 ENCOUNTER — Encounter (HOSPITAL_COMMUNITY)
Admission: RE | Admit: 2012-02-28 | Discharge: 2012-02-28 | Disposition: A | Discharge status: Home / Self Care | Payer: Medicaid Other | Source: Ambulatory Visit | Attending: Obstetrics & Gynecology | Admitting: Obstetrics & Gynecology

## 2012-02-28 VITALS — BP 120/80 | Ht 67.0 in | Wt 237.0 lb

## 2012-02-28 DIAGNOSIS — O2340 Unspecified infection of urinary tract in pregnancy, unspecified trimester: Secondary | ICD-10-CM

## 2012-02-28 DIAGNOSIS — A749 Chlamydial infection, unspecified: Secondary | ICD-10-CM

## 2012-02-28 DIAGNOSIS — B009 Herpesviral infection, unspecified: Secondary | ICD-10-CM

## 2012-02-28 DIAGNOSIS — O1210 Gestational proteinuria, unspecified trimester: Secondary | ICD-10-CM

## 2012-02-28 LAB — CBC
Hemoglobin: 11.1 g/dL — ABNORMAL LOW (ref 12.0–15.0)
MCH: 27.3 pg (ref 26.0–34.0)
MCHC: 32.6 g/dL (ref 30.0–36.0)
Platelets: 265 10*3/uL (ref 150–400)
RDW: 14.2 % (ref 11.5–15.5)

## 2012-02-28 LAB — PREPARE RBC (CROSSMATCH)

## 2012-02-28 NOTE — Patient Instructions (Addendum)
YOUR PROCEDURE IS SCHEDULED ON: 02/29/12  ENTER THROUGH THE MAIN ENTRANCE OF [facility] AT:1:15 pm  USE DESK PHONE AND DIAL [phone] TO INFORM us OF YOUR ARRIVAL  CALL [phone] IF YOU HAVE ANY QUESTIONS OR PROBLEMS PRIOR TO YOUR ARRIVAL.  REMEMBER: DO NOT EAT AFTER MIDNIGHT :tonight   SPECIAL INSTRUCTIONS: clear liquids ok until 1030 am FRI   YOU MAY BRUSH YOUR TEETH THE MORNING OF SURGERY    DO NOT WEAR JEWELRY, EYE MAKEUP, LIPSTICK OR DARK FINGERNAIL POLISH DO NOT WEAR LOTIONS  DO NOT SHAVE FOR 48 HOURS PRIOR TO SURGERY

## 2012-02-28 NOTE — Pre-Procedure Instructions (Signed)
email sent to Dr. Sherron Ales that pt is high risk for [facility].

## 2012-02-29 ENCOUNTER — Encounter (HOSPITAL_COMMUNITY): Payer: Self-pay | Admitting: Anesthesiology

## 2012-02-29 ENCOUNTER — Encounter (HOSPITAL_COMMUNITY): Payer: Self-pay | Admitting: *Deleted

## 2012-02-29 ENCOUNTER — Inpatient Hospital Stay (HOSPITAL_COMMUNITY)
Admission: RE | Admit: 2012-02-29 | Discharge: 2012-03-03 | DRG: 766 | Disposition: A | Discharge status: Home / Self Care | Payer: Medicaid Other | Source: Ambulatory Visit | Attending: Obstetrics & Gynecology | Admitting: Obstetrics & Gynecology

## 2012-02-29 ENCOUNTER — Encounter (HOSPITAL_COMMUNITY)
Admission: RE | Disposition: A | Discharge status: Home / Self Care | Payer: Self-pay | Source: Ambulatory Visit | Attending: Obstetrics & Gynecology

## 2012-02-29 ENCOUNTER — Inpatient Hospital Stay (HOSPITAL_COMMUNITY): Payer: Medicaid Other

## 2012-02-29 ENCOUNTER — Encounter (HOSPITAL_COMMUNITY): Payer: Self-pay

## 2012-02-29 DIAGNOSIS — O2340 Unspecified infection of urinary tract in pregnancy, unspecified trimester: Secondary | ICD-10-CM

## 2012-02-29 DIAGNOSIS — O1210 Gestational proteinuria, unspecified trimester: Secondary | ICD-10-CM

## 2012-02-29 DIAGNOSIS — O34219 Maternal care for unspecified type scar from previous cesarean delivery: Secondary | ICD-10-CM

## 2012-02-29 DIAGNOSIS — A749 Chlamydial infection, unspecified: Secondary | ICD-10-CM

## 2012-02-29 DIAGNOSIS — Z01812 Encounter for preprocedural laboratory examination: Secondary | ICD-10-CM

## 2012-02-29 DIAGNOSIS — Z01818 Encounter for other preprocedural examination: Secondary | ICD-10-CM

## 2012-02-29 DIAGNOSIS — Z98891 History of uterine scar from previous surgery: Secondary | ICD-10-CM

## 2012-02-29 DIAGNOSIS — B009 Herpesviral infection, unspecified: Secondary | ICD-10-CM

## 2012-02-29 LAB — RPR: RPR Ser Ql: NONREACTIVE

## 2012-02-29 SURGERY — Surgical Case
Anesthesia: Spinal | Site: Abdomen | Wound class: Clean Contaminated

## 2012-02-29 MED ORDER — MENTHOL 3 MG MT LOZG: 1.0000 | LOZENGE | OROMUCOSAL | Status: DC | PRN

## 2012-02-29 MED ORDER — SIMETHICONE 80 MG PO CHEW: 80.0000 mg | CHEWABLE_TABLET | ORAL | Status: DC | PRN

## 2012-02-29 MED ORDER — ONDANSETRON HCL 4 MG/2ML IJ SOLN: 4.0000 mg | INTRAMUSCULAR | Status: DC | PRN

## 2012-02-29 MED ORDER — FENTANYL CITRATE 0.05 MG/ML IJ SOLN: 25.0000 ug | INTRAMUSCULAR | Status: DC | PRN

## 2012-02-29 MED ORDER — PHENYLEPHRINE HCL 10 MG/ML IJ SOLN
INTRAMUSCULAR | Status: DC | PRN
  Administered 2012-02-29: 40 ug via INTRAVENOUS
  Administered 2012-02-29: 80 ug via INTRAVENOUS
  Administered 2012-02-29 (×2): 40 ug via INTRAVENOUS

## 2012-02-29 MED ORDER — OXYTOCIN 40 UNITS IN LACTATED RINGERS INFUSION - SIMPLE MED: 62.5000 mL/h | INTRAVENOUS | Status: AC

## 2012-02-29 MED ORDER — MEPERIDINE HCL 25 MG/ML IJ SOLN: 6.2500 mg | INTRAMUSCULAR | Status: DC | PRN

## 2012-02-29 MED ORDER — KETOROLAC TROMETHAMINE 30 MG/ML IJ SOLN: 30.0000 mg | Freq: Four times a day (QID) | INTRAMUSCULAR | Status: DC | PRN

## 2012-02-29 MED ORDER — ONDANSETRON HCL 4 MG/2ML IJ SOLN
INTRAMUSCULAR | Status: DC | PRN
  Administered 2012-02-29: 4 mg via INTRAVENOUS

## 2012-02-29 MED ORDER — OXYCODONE-ACETAMINOPHEN 5-325 MG PO TABS
1.0000 | ORAL_TABLET | ORAL | Status: DC | PRN
  Administered 2012-03-01 – 2012-03-03 (×2): 1 via ORAL
  Filled 2012-02-29 (×4): qty 1

## 2012-02-29 MED ORDER — EPHEDRINE 5 MG/ML INJ
INTRAVENOUS | Status: AC
  Filled 2012-02-29: qty 10

## 2012-02-29 MED ORDER — SODIUM CHLORIDE 0.9 % IJ SOLN: 3.0000 mL | INTRAMUSCULAR | Status: DC | PRN

## 2012-02-29 MED ORDER — FENTANYL CITRATE 0.05 MG/ML IJ SOLN
INTRAMUSCULAR | Status: AC
  Filled 2012-02-29: qty 2

## 2012-02-29 MED ORDER — OXYTOCIN 10 UNIT/ML IJ SOLN
INTRAMUSCULAR | Status: AC
  Filled 2012-02-29: qty 4

## 2012-02-29 MED ORDER — DIPHENHYDRAMINE HCL 25 MG PO CAPS: 25.0000 mg | ORAL_CAPSULE | Freq: Four times a day (QID) | ORAL | Status: DC | PRN

## 2012-02-29 MED ORDER — EPHEDRINE SULFATE 50 MG/ML IJ SOLN
INTRAMUSCULAR | Status: DC | PRN
  Administered 2012-02-29: 15 mg via INTRAVENOUS
  Administered 2012-02-29: 10 mg via INTRAVENOUS

## 2012-02-29 MED ORDER — ZOLPIDEM TARTRATE 5 MG PO TABS: 5.0000 mg | ORAL_TABLET | Freq: Every evening | ORAL | Status: DC | PRN

## 2012-02-29 MED ORDER — IBUPROFEN 600 MG PO TABS
600.0000 mg | ORAL_TABLET | Freq: Four times a day (QID) | ORAL | Status: DC
  Administered 2012-03-01 – 2012-03-03 (×7): 600 mg via ORAL
  Filled 2012-02-29 (×4): qty 1

## 2012-02-29 MED ORDER — SCOPOLAMINE 1 MG/3DAYS TD PT72
MEDICATED_PATCH | TRANSDERMAL | Status: AC
  Filled 2012-02-29: qty 1

## 2012-02-29 MED ORDER — IBUPROFEN 600 MG PO TABS
600.0000 mg | ORAL_TABLET | Freq: Four times a day (QID) | ORAL | Status: DC | PRN
  Filled 2012-02-29 (×5): qty 1

## 2012-02-29 MED ORDER — SIMETHICONE 80 MG PO CHEW
80.0000 mg | CHEWABLE_TABLET | Freq: Three times a day (TID) | ORAL | Status: DC
  Administered 2012-03-01 – 2012-03-03 (×8): 80 mg via ORAL

## 2012-02-29 MED ORDER — KETOROLAC TROMETHAMINE 60 MG/2ML IM SOLN
60.0000 mg | Freq: Once | INTRAMUSCULAR | Status: AC | PRN
  Filled 2012-02-29: qty 2

## 2012-02-29 MED ORDER — 0.9 % SODIUM CHLORIDE (POUR BTL) OPTIME
TOPICAL | Status: DC | PRN
  Administered 2012-02-29: 1000 mL

## 2012-02-29 MED ORDER — DIPHENHYDRAMINE HCL 50 MG/ML IJ SOLN: 25.0000 mg | INTRAMUSCULAR | Status: DC | PRN

## 2012-02-29 MED ORDER — ONDANSETRON HCL 4 MG PO TABS: 4.0000 mg | ORAL_TABLET | ORAL | Status: DC | PRN

## 2012-02-29 MED ORDER — SENNOSIDES-DOCUSATE SODIUM 8.6-50 MG PO TABS
2.0000 | ORAL_TABLET | Freq: Every day | ORAL | Status: DC
  Administered 2012-03-02: 2 via ORAL

## 2012-02-29 MED ORDER — LANOLIN HYDROUS EX OINT: 1.0000 "application " | TOPICAL_OINTMENT | CUTANEOUS | Status: DC | PRN

## 2012-02-29 MED ORDER — LACTATED RINGERS IV SOLN
INTRAVENOUS | Status: DC
  Administered 2012-02-29 (×6): via INTRAVENOUS

## 2012-02-29 MED ORDER — ONDANSETRON HCL 4 MG/2ML IJ SOLN: 4.0000 mg | Freq: Three times a day (TID) | INTRAMUSCULAR | Status: DC | PRN

## 2012-02-29 MED ORDER — TETANUS-DIPHTH-ACELL PERTUSSIS 5-2.5-18.5 LF-MCG/0.5 IM SUSP: 0.5000 mL | Freq: Once | INTRAMUSCULAR | Status: DC

## 2012-02-29 MED ORDER — CEFAZOLIN SODIUM-DEXTROSE 2-3 GM-% IV SOLR
2.0000 g | INTRAVENOUS | Status: AC
  Administered 2012-02-29: 2 g via INTRAVENOUS

## 2012-02-29 MED ORDER — PHENYLEPHRINE 40 MCG/ML (10ML) SYRINGE FOR IV PUSH (FOR BLOOD PRESSURE SUPPORT)
PREFILLED_SYRINGE | INTRAVENOUS | Status: AC
  Filled 2012-02-29: qty 5

## 2012-02-29 MED ORDER — OXYTOCIN 40 UNITS IN LACTATED RINGERS INFUSION - SIMPLE MED
INTRAVENOUS | Status: DC | PRN
  Administered 2012-02-29: 40 [IU] via INTRAVENOUS

## 2012-02-29 MED ORDER — KETOROLAC TROMETHAMINE 30 MG/ML IJ SOLN: 30.0000 mg | Freq: Four times a day (QID) | INTRAMUSCULAR | Status: AC | PRN

## 2012-02-29 MED ORDER — ONDANSETRON HCL 4 MG/2ML IJ SOLN
INTRAMUSCULAR | Status: AC
  Filled 2012-02-29: qty 2

## 2012-02-29 MED ORDER — DIPHENHYDRAMINE HCL 50 MG/ML IJ SOLN
12.5000 mg | INTRAMUSCULAR | Status: DC | PRN
  Administered 2012-03-01: 12.5 mg via INTRAVENOUS
  Filled 2012-02-29: qty 1

## 2012-02-29 MED ORDER — LACTATED RINGERS IV SOLN
INTRAVENOUS | Status: DC
  Administered 2012-03-01: 04:00:00 via INTRAVENOUS

## 2012-02-29 MED ORDER — MORPHINE SULFATE 0.5 MG/ML IJ SOLN
INTRAMUSCULAR | Status: AC
  Filled 2012-02-29: qty 10

## 2012-02-29 MED ORDER — PRENATAL MULTIVITAMIN CH
1.0000 | ORAL_TABLET | Freq: Every day | ORAL | Status: DC
  Administered 2012-03-01 – 2012-03-03 (×3): 1 via ORAL
  Filled 2012-02-29 (×3): qty 1

## 2012-02-29 MED ORDER — METOCLOPRAMIDE HCL 5 MG/ML IJ SOLN: 10.0000 mg | Freq: Three times a day (TID) | INTRAMUSCULAR | Status: DC | PRN

## 2012-02-29 MED ORDER — SCOPOLAMINE 1 MG/3DAYS TD PT72: 1.0000 | MEDICATED_PATCH | Freq: Once | TRANSDERMAL | Status: DC

## 2012-02-29 MED ORDER — NALOXONE HCL 0.4 MG/ML IJ SOLN: 0.4000 mg | INTRAMUSCULAR | Status: DC | PRN

## 2012-02-29 MED ORDER — SODIUM CHLORIDE 0.9 % IV SOLN
1.0000 ug/kg/h | INTRAVENOUS | Status: DC | PRN
  Filled 2012-02-29: qty 2.5

## 2012-02-29 MED ORDER — DIBUCAINE 1 % RE OINT: 1.0000 "application " | TOPICAL_OINTMENT | RECTAL | Status: DC | PRN

## 2012-02-29 MED ORDER — KETOROLAC TROMETHAMINE 60 MG/2ML IM SOLN
INTRAMUSCULAR | Status: AC
  Filled 2012-02-29: qty 2

## 2012-02-29 MED ORDER — CEFAZOLIN SODIUM-DEXTROSE 2-3 GM-% IV SOLR
INTRAVENOUS | Status: AC
  Filled 2012-02-29: qty 50

## 2012-02-29 MED ORDER — NALBUPHINE HCL 10 MG/ML IJ SOLN
5.0000 mg | INTRAMUSCULAR | Status: DC | PRN
  Filled 2012-02-29: qty 1

## 2012-02-29 MED ORDER — DIPHENHYDRAMINE HCL 25 MG PO CAPS: 25.0000 mg | ORAL_CAPSULE | ORAL | Status: DC | PRN

## 2012-02-29 MED ORDER — WITCH HAZEL-GLYCERIN EX PADS: 1.0000 "application " | MEDICATED_PAD | CUTANEOUS | Status: DC | PRN

## 2012-02-29 SURGICAL SUPPLY — 37 items
BENZOIN TINCTURE PRP APPL 2/3 (GAUZE/BANDAGES/DRESSINGS) ×2 IMPLANT
CHLORAPREP W/TINT 26ML (MISCELLANEOUS) ×2 IMPLANT
CLOTH BEACON ORANGE TIMEOUT ST (SAFETY) ×2 IMPLANT
DRESSING TELFA 8X3 (GAUZE/BANDAGES/DRESSINGS) ×2 IMPLANT
ELECT REM PT RETURN 9FT ADLT (ELECTROSURGICAL) ×2
ELECTRODE REM PT RTRN 9FT ADLT (ELECTROSURGICAL) ×1 IMPLANT
EXTRACTOR VACUUM M CUP 4 TUBE (SUCTIONS) IMPLANT
GAUZE SPONGE 4X4 12PLY STRL LF (GAUZE/BANDAGES/DRESSINGS) ×2 IMPLANT
GLOVE BIO SURGEON ST LM GN SZ9 (GLOVE) ×2 IMPLANT
GLOVE BIO SURGEON STRL SZ7 (GLOVE) ×2 IMPLANT
GLOVE BIO SURGEON STRL SZ7.5 (GLOVE) ×2 IMPLANT
GLOVE BIOGEL PI IND STRL 7.0 (GLOVE) ×1 IMPLANT
GLOVE BIOGEL PI IND STRL 9 (GLOVE) ×1 IMPLANT
GLOVE BIOGEL PI INDICATOR 7.0 (GLOVE) ×1
GLOVE BIOGEL PI INDICATOR 9 (GLOVE) ×1
GLOVE INDICATOR 7.5 STRL GRN (GLOVE) ×2 IMPLANT
GOWN PREVENTION PLUS LG XLONG (DISPOSABLE) ×4 IMPLANT
GOWN PREVENTION PLUS XLARGE (GOWN DISPOSABLE) ×2 IMPLANT
GOWN STRL REIN 2XL XLG LVL4 (GOWN DISPOSABLE) ×2 IMPLANT
KIT ABG SYR 3ML LUER SLIP (SYRINGE) IMPLANT
NEEDLE HYPO 22GX1.5 SAFETY (NEEDLE) ×2 IMPLANT
NEEDLE HYPO 25X5/8 SAFETYGLIDE (NEEDLE) IMPLANT
NS IRRIG 1000ML POUR BTL (IV SOLUTION) ×2 IMPLANT
PACK C SECTION WH (CUSTOM PROCEDURE TRAY) ×2 IMPLANT
PAD ABD 7.5X8 STRL (GAUZE/BANDAGES/DRESSINGS) ×2 IMPLANT
RTRCTR C-SECT PINK 25CM LRG (MISCELLANEOUS) ×2 IMPLANT
SLEEVE SCD COMPRESS KNEE MED (MISCELLANEOUS) ×2 IMPLANT
STAPLER VISISTAT 35W (STAPLE) IMPLANT
STRIP CLOSURE SKIN 1/2X4 (GAUZE/BANDAGES/DRESSINGS) ×2 IMPLANT
SUT VIC AB 0 CT1 36 (SUTURE) ×2 IMPLANT
SUT VIC AB 0 CTX 36 (SUTURE) ×3
SUT VIC AB 0 CTX36XBRD ANBCTRL (SUTURE) ×3 IMPLANT
SUT VIC AB 4-0 KS 27 (SUTURE) ×2 IMPLANT
TAPE CLOTH SURG 4X10 WHT LF (GAUZE/BANDAGES/DRESSINGS) ×2 IMPLANT
TOWEL OR 17X24 6PK STRL BLUE (TOWEL DISPOSABLE) ×4 IMPLANT
TRAY FOLEY CATH 14FR (SET/KITS/TRAYS/PACK) ×2 IMPLANT
WATER STERILE IRR 1000ML POUR (IV SOLUTION) ×2 IMPLANT

## 2012-02-29 NOTE — Anesthesia Preprocedure Evaluation (Signed)
Anesthesia Evaluation  Patient identified by MRN, date of birth, ID band Patient awake    Reviewed: Allergy & Precautions, H&P , NPO status , Patient's Chart, lab work & pertinent test results, reviewed documented beta blocker date and time   History of Anesthesia Complications Negative for: history of anesthetic complications  Airway Mallampati: I TM Distance: >3 FB Neck ROM: full    Dental  (+)    Pulmonary former smoker,  breath sounds clear to auscultation        Cardiovascular negative cardio ROS  Rhythm:regular Rate:Normal     Neuro/Psych negative neurological ROS  negative psych ROS   GI/Hepatic negative GI ROS, Neg liver ROS,   Endo/Other  Morbid obesity  Renal/GU negative Renal ROS  negative genitourinary   Musculoskeletal   Abdominal   Peds  Hematology negative hematology ROS (+)   Anesthesia Other Findings   Reproductive/Obstetrics (+) Pregnancy (1 prior c/s under [facility] (per patient because she did not want epidural))                           Anesthesia Physical Anesthesia Plan  ASA: II  Anesthesia Plan: Spinal   Post-op Pain Management:    Induction:   Airway Management Planned:   Additional Equipment:   Intra-op Plan:   Post-operative Plan:   Informed Consent: I have reviewed the patients History and Physical, chart, labs and discussed the procedure including the risks, benefits and alternatives for the proposed anesthesia with the patient or authorized representative who has indicated his/her understanding and acceptance.     Plan Discussed with: Surgeon and CRNA  Anesthesia Plan Comments:         Anesthesia Quick Evaluation

## 2012-02-29 NOTE — Anesthesia Postprocedure Evaluation (Signed)
Anesthesia Post Note  Patient: Jasmine Hudson  Procedure(s) Performed: Procedure(s) (LRB): CESAREAN SECTION (N/A)  Anesthesia type: Spinal  Patient location: PACU  Post pain: Pain level controlled  Post assessment: Post-op Vital signs reviewed  Last Vitals:  Filed Vitals:   02/29/12 2003  BP: 135/72  Pulse: 89  Temp: 36.6 C  Resp: 16    Post vital signs: Reviewed  Level of consciousness: awake  Complications: No apparent anesthesia complications

## 2012-02-29 NOTE — Op Note (Signed)
Jasmine Hudson PROCEDURE DATE: 02/29/2012  PREOPERATIVE DIAGNOSIS: Intrauterine pregnancy at  [redacted]w[redacted]d weeks gestation; elective repeat  POSTOPERATIVE DIAGNOSIS: The same  PROCEDURE: Repeat Low Transverse Cesarean Section  SURGEON:  Dr. Christin Bach  ASSISTANT: Dr Candelaria Celeste  INDICATIONS: Jasmine Hudson is a 21 y.o. G2P1001 at [redacted]w[redacted]d scheduled for cesarean section secondary to elective repeat.  The risks of cesarean section discussed with the patient included but were not limited to: bleeding which may require transfusion or reoperation; infection which may require antibiotics; injury to bowel, bladder, ureters or other surrounding organs; injury to the fetus; need for additional procedures including hysterectomy in the event of a life-threatening hemorrhage; placental abnormalities wth subsequent pregnancies, incisional problems, thromboembolic phenomenon and other postoperative/anesthesia complications. The patient concurred with the proposed plan, giving informed written consent for the procedure.    FINDINGS:  Viable female infant in cephalic presentation.  Apgars 9 and 9, weight pending.  Clear amniotic fluid.  Intact placenta, three vessel cord.  Normal uterus, fallopian tubes and ovaries bilaterally.  ANESTHESIA:    Spinal INTRAVENOUS FLUIDS:2800 ml ESTIMATED BLOOD LOSS: 850 ml URINE OUTPUT:  300 ml SPECIMENS: Placenta sent to L&D COMPLICATIONS: None immediate  PROCEDURE IN DETAIL:  The patient received intravenous antibiotics and had sequential compression devices applied to her lower extremities while in the preoperative area.  She was then taken to the operating room where spinal anesthesia was administered and was found to be adequate. She was then placed in a dorsal supine position with a leftward tilt, and prepped and draped in a sterile manner.  A foley catheter was placed into her bladder and attached to constant gravity, which drained clear fluid throughout.  After an adequate  timeout was performed, an eliptical incision was made removing the old scar tissue.  The incision was carried through to the underlying layer of fascia. The fascia was incised in the midline and this incision was extended bilaterally using the Mayo scissors. Kocher clamps were applied to the superior aspect of the fascial incision and the underlying rectus muscles were dissected off bluntly. A similar process was carried out on the inferior aspect of the facial incision.  An Alexis retractor was then placed.  The rectus muscles were separated in the midline bluntly and the peritoneum was entered bluntly. Attention was turned to the lower uterine segment where a transverse hysterotomy was made with a scalpel and extended bilaterally bluntly. The infant was successfully delivered, and cord was clamped and cut and infant was handed over to awaiting neonatology team. Uterine massage was then administered and the placenta delivered intact with three-vessel cord. The uterus was then cleared of clot and debris.  The hysterotomy was closed with 0 Vicryl in a running locked fashion, and an imbricating layer was also placed with a 0 Chromic. Overall, excellent hemostasis was noted. The abdomen and the pelvis were cleared of all clot and debris. Hemostasis was confirmed on all surfaces.  The peritoneum was reapproximated using 2-0 vicryl running stitches. The fascia was then closed using 0 Vicryl in a running fashion.  The subcutaneous layer was reapproximated with 3-0 Vicryl and the skin was closed with 4-0 Vicryl. The patient tolerated the procedure well. Sponge, lap, instrument and needle counts were correct x 2. She was taken to the recovery room in stable condition.    Candelaria Celeste JEHIEL DO 02/29/2012 7:44 PM

## 2012-02-29 NOTE — Progress Notes (Signed)
Patient refusing scope patch and toradol.

## 2012-02-29 NOTE — Anesthesia Procedure Notes (Signed)

## 2012-02-29 NOTE — Progress Notes (Signed)
Patient refused scop patch in SS.

## 2012-02-29 NOTE — H&P (Signed)
ODESSER TOURANGEAU is a 21 y.o. female G2P1001 with IUP at [redacted]w[redacted]d presenting for elective repeat cesarean section. Pt states she has been having no contractions, no vaginal bleeding, intact membranes, with active fetal movement.    Prenatal Course Source of Care: [facility]  with onset of care at 27weeks Pregnancy complications or risks: Patient Active Problem List  Diagnosis  . Previous cesarean delivery affecting pregnancy, antepartum  . Herpes  . Chlamydia infection, current pregnancy  . Supervision of other normal pregnancy  . UTI in pregnancy  . Proteinuria complicating pregnancy   She desires to condoms.  She plans to plans to breastfeed  Prenatal labs and studies: ABO, Rh: --/--/A POS (07/18 1615) Antibody: NEG (07/18 1615) Rubella:  immune RPR: NON REACTIVE (07/18 1615)  HBsAg: NEGATIVE (04/25 0937)  HIV: NON REACTIVE (04/25 0937)  GBS:   negative 1 hr Glucola 75 Genetic screening presented too late Anatomy US normal  Past Medical History:  Past Medical History  Diagnosis Date  . No pertinent past medical history     Past Surgical History:  Past Surgical History  Procedure Date  . Cesarean section     Obstetrical History:  OB History    Grav Para Term Preterm Abortions TAB SAB Ect Mult Living   2 1 1  0      1      Gynecological History:  OB History    Grav Para Term Preterm Abortions TAB SAB Ect Mult Living   2 1 1  0      1      Social History:  History   Social History  . Marital Status: Single    Spouse Name: N/A    Number of Children: N/A  . Years of Education: N/A   Social History Main Topics  . Smoking status: Former Smoker    Types: Cigarettes  . Smokeless tobacco: Not on file  . Alcohol Use: No  . Drug Use: No  . Sexually Active: Yes    Birth Control/ Protection: None   Other Topics Concern  . Not on file   Social History Narrative  . No narrative on file    Family History:  Family History  Problem Relation Age of Onset  .  Hypertension Mother     Medications:  Prenatal vitamins,  Current Facility-Administered Medications  Medication Dose Route Frequency Provider Last Rate Last Dose  . ceFAZolin (ANCEF) 2-3 GM-% IVPB SOLR           . ceFAZolin (ANCEF) IVPB 2 g/50 mL premix  2 g Intravenous On Call to OR Willodean Rosenthal, MD      . lactated ringers infusion   Intravenous Continuous Willodean Rosenthal, MD      . scopolamine (TRANSDERM-SCOP) 1.5 MG 1.5 mg  1 patch Transdermal Once Dana Allan, MD      . scopolamine (TRANSDERM-SCOP) 1.5 MG             Allergies: No Known Allergies  Review of Systems: - Negative  Physical Exam: Last menstrual period 05/27/2011. GENERAL: Well-developed, well-nourished female in no acute distress.  LUNGS: Clear to auscultation bilaterally.  HEART: Regular rate and rhythm. ABDOMEN: Soft, nontender, nondistended, gravid. EFW 8 lbs EXTREMITIES: Nontender, no edema, 2+ distal pulses.    Pertinent Labs/Studies:  none  Assessment : ELLAJANE STONG is a 21 y.o. G2P1001 at [redacted]w[redacted]d being admitted for elective repeat  Plan: The risks of cesarean section discussed with the patient included but were not limited to: bleeding  which may require transfusion or reoperation; infection which may require antibiotics; injury to bowel, bladder, ureters or other surrounding organs; injury to the fetus; need for additional procedures including hysterectomy in the event of a life-threatening hemorrhage; placental abnormalities wth subsequent pregnancies, incisional problems, thromboembolic phenomenon and other postoperative/anesthesia complications. The patient concurred with the proposed plan, giving informed written consent for the procedure.   Patient has been NPO since last night she will remain NPO for procedure. Anesthesia and OR aware.  Preoperative prophylactic Ancef ordered on call to the OR.  To OR when ready.    Camerin Jimenez JEHIEL 02/29/2012, 1:29 PM

## 2012-02-29 NOTE — Transfer of Care (Signed)
Immediate Anesthesia Transfer of Care Note  Patient: Jasmine Hudson  Procedure(s) Performed: Procedure(s) (LRB): CESAREAN SECTION (N/A)  Patient Location: PACU  Anesthesia Type: Spinal  Level of Consciousness: awake, alert  and oriented  Airway & Oxygen Therapy: Patient Spontanous Breathing  Post-op Assessment: Report given to PACU RN and Post -op Vital signs reviewed and stable  Post vital signs: Reviewed and stable  Complications: No apparent anesthesia complications

## 2012-03-01 LAB — CBC
Hemoglobin: 10.4 g/dL — ABNORMAL LOW (ref 12.0–15.0)
MCH: 27.4 pg (ref 26.0–34.0)
MCHC: 32.7 g/dL (ref 30.0–36.0)
MCV: 83.7 fL (ref 78.0–100.0)
Platelets: 235 10*3/uL (ref 150–400)
RBC: 3.8 MIL/uL — ABNORMAL LOW (ref 3.87–5.11)

## 2012-03-01 NOTE — Progress Notes (Signed)
Subjective: Postpartum Day 1: Cesarean Delivery Patient reports incisional pain, tolerating PO and no problems voiding.    Objective: Vital signs in last 24 hours: Temp:  [97.3 F (36.3 C)-98.7 F (37.1 C)] 98.7 F (37.1 C) (07/20 0535) Pulse Rate:  [68-102] 102  (07/20 0535) Resp:  [16-29] 20  (07/20 0535) BP: (106-147)/(67-91) 106/68 mmHg (07/20 0535) SpO2:  [97 %-100 %] 97 % (07/20 0535) Weight:  [99.791 kg (220 lb)] 99.791 kg (220 lb) (07/19 2100)  Physical Exam:  General: alert, cooperative and no distress Lochia: appropriate Uterine Fundus: firm Incision: no significant drainage DVT Evaluation: No evidence of DVT seen on physical exam. Negative Homan's sign. No cords or calf tenderness. No significant calf/ankle edema.   Basename 03/01/12 0500 02/28/12 1615  HGB 10.4* 11.1*  HCT 31.8* 34.0*    Assessment/Plan: Status post Cesarean section. Doing well postoperatively.  Continue current care.  Legend Tumminello JEHIEL 03/01/2012, 7:15 AM

## 2012-03-02 ENCOUNTER — Encounter (HOSPITAL_COMMUNITY): Payer: Self-pay | Admitting: *Deleted

## 2012-03-02 NOTE — Progress Notes (Signed)
Subjective: Postpartum Day #2: Cesarean Delivery Patient reports tolerating PO and no problems voiding; ambulating without difficulty; declines discharge home today; breast and bottlefeeding; unsure re contraception  Objective: Vital signs in last 24 hours: Temp:  [97.9 F (36.6 C)-99.3 F (37.4 C)] 97.9 F (36.6 C) (07/21 0348) Pulse Rate:  [92-111] 92  (07/21 0348) Resp:  [18] 18  (07/21 0348) BP: (108-140)/(66-89) 114/79 mmHg (07/21 0348) SpO2:  [95 %-99 %] 95 % (07/20 1535)  Physical Exam:  General: alert, cooperative and no distress Lochia: appropriate Uterine Fundus: firm Incision: healing well; steri strips present and slightly stained but no drainage seen DVT Evaluation: No evidence of DVT seen on physical exam.   Basename 03/01/12 0500 02/28/12 1615  HGB 10.4* 11.1*  HCT 31.8* 34.0*    Assessment/Plan: Status post Cesarean section. Doing well postoperatively.  Continue current care.  Cam Hai 03/02/2012, 7:19 AM

## 2012-03-03 ENCOUNTER — Encounter (HOSPITAL_COMMUNITY): Payer: Self-pay | Admitting: Obstetrics and Gynecology

## 2012-03-03 LAB — TYPE AND SCREEN
ABO/RH(D): A POS
Unit division: 0

## 2012-03-03 MED ORDER — OXYCODONE-ACETAMINOPHEN 5-325 MG PO TABS: 1.0000 | ORAL_TABLET | ORAL | Status: AC | PRN

## 2012-03-03 MED ORDER — IBUPROFEN 600 MG PO TABS: 600.0000 mg | ORAL_TABLET | Freq: Four times a day (QID) | ORAL | Status: AC | PRN

## 2012-03-03 NOTE — Discharge Summary (Signed)
Obstetric Discharge Summary Reason for Admission: cesarean section Prenatal Procedures: ultrasound Intrapartum Procedures: cesarean: low cervical, transverse Postpartum Procedures: none Complications-Operative and Postpartum: none Hemoglobin  Date Value Range Status  03/01/2012 10.4* 12.0 - 15.0 g/dL Final     HCT  Date Value Range Status  03/01/2012 31.8* 36.0 - 46.0 % Final    Physical Exam:  General: alert and cooperative Lochia: appropriate Uterine Fundus: firm Incision: healing well, no significant drainage, no dehiscence, no significant erythema DVT Evaluation: No evidence of DVT seen on physical exam. Negative Homan's sign. No cords or calf tenderness. No significant calf/ankle edema.  Discharge Diagnoses: Term Pregnancy-delivered  Discharge Information: Date: 03/03/2012 Activity: unrestricted Diet: routine Medications: Ibuprofen and Percocet Condition: stable Instructions: refer to practice specific booklet Discharge to: home   Newborn Data: Live born female  Birth Weight: 7 lb 14.6 oz (3589 g) APGAR: 9, 9  Home with mother.  Marikay Alar 03/03/2012, 7:26 AM  I have seen and examined this patient and I agree with the above. Breast and bottle feeding; undecided re contraception. SHAW, KIMBERLY 8:01 AM 03/03/2012

## 2012-03-03 NOTE — Progress Notes (Signed)
UR chart review completed.  

## 2012-03-05 ENCOUNTER — Ambulatory Visit: Payer: Self-pay

## 2012-03-05 ENCOUNTER — Ambulatory Visit (INDEPENDENT_AMBULATORY_CARE_PROVIDER_SITE_OTHER): Payer: Self-pay | Admitting: Obstetrics & Gynecology

## 2012-03-05 ENCOUNTER — Encounter: Payer: Self-pay | Admitting: Physician Assistant

## 2012-03-05 VITALS — BP 130/80 | HR 99 | Temp 97.0°F | Ht 66.0 in | Wt 220.9 lb

## 2012-03-05 DIAGNOSIS — Z09 Encounter for follow-up examination after completed treatment for conditions other than malignant neoplasm: Secondary | ICD-10-CM

## 2012-03-05 DIAGNOSIS — G8918 Other acute postprocedural pain: Secondary | ICD-10-CM

## 2012-03-05 NOTE — Progress Notes (Signed)
Subjective:     Patient ID: Jasmine Hudson, female   DOB: April 23, 1991, 21 y.o.   MRN: [id number]  HPI Pt c/o pain post op after c/s.  Pt has NOT picked up pain meds (revealed this at the end of the appt).  No fever or chills.  NO abnormal drainage.  Did not get abd binder at hosp.   Review of Systems Branch     Objective:   Physical Exam Lungs: CTA CV: RRR Abd: soft, NT, ND, no rebound or guarding      Assessment:    Post op pain.  NOT taking pain meds    Plan:     Rec fill rx from hosp Abd binder.  Given here.  Ladonte Verstraete L. Harraway-Smith, M.D., Evern Core

## 2012-03-05 NOTE — Patient Instructions (Signed)
Pain Relief Preoperatively and Postoperatively Being a good patient does not mean being a silent one.If you have questions, problems, or concerns about the pain you may feel after surgery, let your caregiver know.Patients have the right to assessment and management of pain. The treatment of pain after surgery is important to speed up recovery and return to normal activities. Severe pain after surgery, and the fear or anxiety associated with that pain, may cause extreme discomfort that:  Prevents sleep.   Decreases the ability to breathe deeply and cough. This can cause pneumonia or other upper airway infections.   Causes your heart to beat faster and your blood pressure to be higher.   Increases the risk for constipation and bloating.   Decreases the ability of wounds to heal.   May result in depression, increased anxiety, and feelings of helplessness.  Relief of pain before surgery is also important because it will lessen the pain after surgery. Patients who receive both pain relief before and after surgery experience greater pain relief than those who only receive pain relief after surgery. Let your caregiver know if you are having uncontrolled pain.This is very important.Pain after surgery is more difficult to manage if it is permitted to become severe, so prompt and adequate treatment of acute pain is necessary. PAIN CONTROL METHODS Your caregivers follow policies and procedures about the management of patient pain.These guidelines should be explained to you before surgery.Plans for pain control after surgery must be mutually decided upon and instituted with your full understanding and agreement.Do not be afraid to ask questions regarding the care you are receiving.There are many different ways your caregivers will attempt to control your pain, including the following methods. As needed pain control  You may be given pain medicine either through your intravenous (IV) tube, or as a pill  or liquid you can swallow. You will need to let your caregiver know when you are having pain. Then, your caregiver will give you the pain medicine ordered for you.   Your pain medicine may make you constipated. If constipation occurs, drink more liquids if you can. Your caregiver may have you take a mild laxative.  IV patient-controlled analgesia pump (PCA pump)  You can get your pain medicine through the IV tube which goes into your vein. You are able to control the amount of pain medicine that you get. The pain medicine flows in through an IV tube and is controlled by a pump. This pump gives you a set amount of pain medicine when you push the button hooked up to it. Nobody should push this button but you or someone specifically assigned by you to do so. It is set up to keep you from accidentally giving yourself too much pain medicine. You will be able to start using your pain pump in the recovery room after your surgery. This method can be helpful for most types of surgery.   If you are still having too much pain, tell your caregiver. Also, tell your caregiver if you are feeling too sleepy or nauseous.  Continuous epidural pain control  A thin, soft tube (catheter) is put into your back. Pain medicine flows through the catheter to lessen pain in the part of your body where the surgery is done. Continuous epidural pain control may work best for you if you are having surgery on your chest, abdomen, hip area, or legs. The epidural catheter is usually put into your back just before surgery. The catheter is left in until you   can eat and take medicine by mouth. In most cases, this may take 2 to 3 days.   Giving pain medicine through the epidural catheter may help you heal faster because:   Your bowel gets back to normal faster.   You can get back to eating sooner.   You can be up and walking sooner.  Medicine that numbs the area (local anesthetic)  You may receive an injection of pain medicine near  where the pain is (local infiltration).   You may receive an injection of pain medicine near the nerve that controls the sensation to a specific part of the body (peripheral nerve block).   Medicine may be put in the spine to block pain (spinal block).  Opioids  Moderate to moderately severe acute pain after surgery may respond to opioids.Opioids are narcotic pain medicine. Opioids are often combined with non-narcotic medicines to improve pain relief, diminish the risk of side effects, and reduce the chance of addiction.   If you follow your caregiver's directions about taking opioids and you do not have a history of substance abuse, your risk of becoming addicted is exceptionally small.Opioids are given for short periods of time in careful doses to prevent addiction.  Other methods of pain control include:  Steroids.   Physical therapy.   Heat and cold therapy.   Compression, such as wrapping an elastic bandage around the area of pain.   Massage.  These various ways of controlling pain may be used together. Combining different methods of pain control is called multimodal analgesia. Using this approach has many benefits, including being able to eat, move around, and leave the hospital sooner. Document Released: 10/20/2002 Document Revised: 07/19/2011 Document Reviewed: 10/24/2010 [employer] Patient Information 2012 [employer]. 

## 2012-03-17 NOTE — Discharge Summary (Signed)
Attestation of Attending Supervision of Advanced Practitioner: Evaluation and management procedures were performed by the PA/NP/CNM/OB Fellow under my supervision/collaboration. Chart reviewed and agree with discharge documentation and plan.  Dolorez Jeffrey V 03/17/2012 6:40 AM

## 2012-03-17 NOTE — Op Note (Signed)
I was present and actively participated in the above procedure from beginning to completion.

## 2012-03-27 ENCOUNTER — Encounter: Payer: Self-pay | Admitting: Obstetrics & Gynecology

## 2012-03-27 ENCOUNTER — Ambulatory Visit: Payer: Self-pay | Admitting: Family

## 2012-03-30 ENCOUNTER — Observation Stay (HOSPITAL_COMMUNITY)
Admission: EM | Admit: 2012-03-30 | Discharge: 2012-03-30 | Disposition: A | Discharge status: Home / Self Care | Payer: Medicaid Other | Attending: Emergency Medicine | Admitting: Emergency Medicine

## 2012-03-30 ENCOUNTER — Inpatient Hospital Stay (HOSPITAL_COMMUNITY)
Admission: EM | Admit: 2012-03-30 | Discharge: 2012-04-01 | DRG: 776 | Disposition: A | Discharge status: Home / Self Care | Payer: Medicaid Other | Attending: Internal Medicine | Admitting: Internal Medicine

## 2012-03-30 ENCOUNTER — Encounter (HOSPITAL_COMMUNITY): Payer: Self-pay | Admitting: *Deleted

## 2012-03-30 ENCOUNTER — Emergency Department (HOSPITAL_COMMUNITY): Payer: Medicaid Other

## 2012-03-30 ENCOUNTER — Encounter (HOSPITAL_COMMUNITY): Payer: Self-pay | Admitting: Emergency Medicine

## 2012-03-30 DIAGNOSIS — I959 Hypotension, unspecified: Secondary | ICD-10-CM | POA: Diagnosis present

## 2012-03-30 DIAGNOSIS — A749 Chlamydial infection, unspecified: Secondary | ICD-10-CM

## 2012-03-30 DIAGNOSIS — O1210 Gestational proteinuria, unspecified trimester: Secondary | ICD-10-CM

## 2012-03-30 DIAGNOSIS — O85 Puerperal sepsis: Secondary | ICD-10-CM | POA: Diagnosis present

## 2012-03-30 DIAGNOSIS — O2340 Unspecified infection of urinary tract in pregnancy, unspecified trimester: Secondary | ICD-10-CM

## 2012-03-30 DIAGNOSIS — N390 Urinary tract infection, site not specified: Secondary | ICD-10-CM | POA: Insufficient documentation

## 2012-03-30 DIAGNOSIS — O34219 Maternal care for unspecified type scar from previous cesarean delivery: Secondary | ICD-10-CM

## 2012-03-30 DIAGNOSIS — IMO0001 Reserved for inherently not codable concepts without codable children: Secondary | ICD-10-CM | POA: Insufficient documentation

## 2012-03-30 DIAGNOSIS — N12 Tubulo-interstitial nephritis, not specified as acute or chronic: Secondary | ICD-10-CM

## 2012-03-30 DIAGNOSIS — R6510 Systemic inflammatory response syndrome (SIRS) of non-infectious origin without acute organ dysfunction: Secondary | ICD-10-CM | POA: Diagnosis present

## 2012-03-30 DIAGNOSIS — Z3480 Encounter for supervision of other normal pregnancy, unspecified trimester: Secondary | ICD-10-CM

## 2012-03-30 DIAGNOSIS — N10 Acute tubulo-interstitial nephritis: Secondary | ICD-10-CM | POA: Diagnosis present

## 2012-03-30 DIAGNOSIS — O2390 Unspecified genitourinary tract infection in pregnancy, unspecified trimester: Principal | ICD-10-CM | POA: Diagnosis present

## 2012-03-30 DIAGNOSIS — B009 Herpesviral infection, unspecified: Secondary | ICD-10-CM

## 2012-03-30 DIAGNOSIS — R509 Fever, unspecified: Secondary | ICD-10-CM | POA: Insufficient documentation

## 2012-03-30 DIAGNOSIS — D72829 Elevated white blood cell count, unspecified: Secondary | ICD-10-CM | POA: Insufficient documentation

## 2012-03-30 DIAGNOSIS — R110 Nausea: Principal | ICD-10-CM | POA: Insufficient documentation

## 2012-03-30 LAB — CBC WITH DIFFERENTIAL/PLATELET
Basophils Absolute: 0 10*3/uL (ref 0.0–0.1)
Basophils Relative: 0 % (ref 0–1)
Eosinophils Absolute: 0.1 10*3/uL (ref 0.0–0.7)
Eosinophils Relative: 0 % (ref 0–5)
HCT: 37.4 % (ref 36.0–46.0)
Hemoglobin: 12.4 g/dL (ref 12.0–15.0)
Lymphocytes Relative: 10 % — ABNORMAL LOW (ref 12–46)
Lymphs Abs: 1.7 10*3/uL (ref 0.7–4.0)
MCH: 27.8 pg (ref 26.0–34.0)
MCHC: 33.2 g/dL (ref 30.0–36.0)
MCV: 83.9 fL (ref 78.0–100.0)
Monocytes Absolute: 1.5 10*3/uL — ABNORMAL HIGH (ref 0.1–1.0)
Monocytes Relative: 9 % (ref 3–12)
Neutro Abs: 14 10*3/uL — ABNORMAL HIGH (ref 1.7–7.7)
Neutrophils Relative %: 81 % — ABNORMAL HIGH (ref 43–77)
Platelets: 207 10*3/uL (ref 150–400)
RBC: 4.46 MIL/uL (ref 3.87–5.11)
RDW: 15.6 % — ABNORMAL HIGH (ref 11.5–15.5)
WBC: 17.4 10*3/uL — ABNORMAL HIGH (ref 4.0–10.5)

## 2012-03-30 LAB — URINALYSIS, ROUTINE W REFLEX MICROSCOPIC
Bilirubin Urine: NEGATIVE
Glucose, UA: NEGATIVE mg/dL
Ketones, ur: NEGATIVE mg/dL
Nitrite: POSITIVE — AB
Protein, ur: 30 mg/dL — AB
Specific Gravity, Urine: 1.013 (ref 1.005–1.030)
Urobilinogen, UA: 1 mg/dL (ref 0.0–1.0)
pH: 6 (ref 5.0–8.0)

## 2012-03-30 LAB — COMPREHENSIVE METABOLIC PANEL
ALT: 17 U/L (ref 0–35)
AST: 20 U/L (ref 0–37)
Albumin: 3.2 g/dL — ABNORMAL LOW (ref 3.5–5.2)
Alkaline Phosphatase: 80 U/L (ref 39–117)
BUN: 7 mg/dL (ref 6–23)
CO2: 26 mEq/L (ref 19–32)
Calcium: 9 mg/dL (ref 8.4–10.5)
Chloride: 101 mEq/L (ref 96–112)
Creatinine, Ser: 0.79 mg/dL (ref 0.50–1.10)
GFR calc Af Amer: >90 mL/min (ref >90)
GFR calc non Af Amer: >90 mL/min (ref >90)
Glucose, Bld: 105 mg/dL — ABNORMAL HIGH (ref 70–99)
Potassium: 3.3 mEq/L — ABNORMAL LOW (ref 3.5–5.1)
Sodium: 137 mEq/L (ref 135–145)
Total Bilirubin: 0.4 mg/dL (ref 0.3–1.2)
Total Protein: 7 g/dL (ref 6.0–8.3)

## 2012-03-30 LAB — URINE MICROSCOPIC-ADD ON

## 2012-03-30 MED ORDER — ONDANSETRON HCL 4 MG/2ML IJ SOLN
4.0000 mg | Freq: Once | INTRAMUSCULAR | Status: AC
  Administered 2012-03-30: 4 mg via INTRAVENOUS
  Filled 2012-03-30: qty 2

## 2012-03-30 MED ORDER — SODIUM CHLORIDE 0.9 % IV SOLN
1000.0000 mL | Freq: Once | INTRAVENOUS | Status: AC
  Administered 2012-03-30: 1000 mL via INTRAVENOUS

## 2012-03-30 MED ORDER — CIPROFLOXACIN IN D5W 400 MG/200ML IV SOLN: 400.0000 mg | Freq: Two times a day (BID) | INTRAVENOUS | Status: DC

## 2012-03-30 MED ORDER — SODIUM CHLORIDE 0.9 % IV BOLUS (SEPSIS)
1000.0000 mL | Freq: Once | INTRAVENOUS | Status: AC
  Administered 2012-03-30: 1000 mL via INTRAVENOUS

## 2012-03-30 MED ORDER — ONDANSETRON HCL 4 MG/2ML IJ SOLN: 4.0000 mg | Freq: Four times a day (QID) | INTRAMUSCULAR | Status: DC | PRN

## 2012-03-30 MED ORDER — SODIUM CHLORIDE 0.9 % IV SOLN: 1000.0000 mL | INTRAVENOUS | Status: DC

## 2012-03-30 MED ORDER — DEXTROSE 5 % IV SOLN: 1.0000 g | INTRAVENOUS | Status: DC

## 2012-03-30 MED ORDER — ACETAMINOPHEN 500 MG PO TABS
1000.0000 mg | ORAL_TABLET | Freq: Once | ORAL | Status: AC
  Administered 2012-03-30: 1000 mg via ORAL
  Filled 2012-03-30: qty 2

## 2012-03-30 MED ORDER — ACETAMINOPHEN 325 MG PO TABS
650.0000 mg | ORAL_TABLET | Freq: Once | ORAL | Status: AC
  Administered 2012-03-30: 650 mg via ORAL
  Filled 2012-03-30: qty 2

## 2012-03-30 MED ORDER — DEXTROSE 5 % IV SOLN
1.0000 g | Freq: Once | INTRAVENOUS | Status: AC
  Administered 2012-03-30: 1 g via INTRAVENOUS
  Filled 2012-03-30: qty 10

## 2012-03-30 MED ORDER — ACETAMINOPHEN 325 MG PO TABS: 650.0000 mg | ORAL_TABLET | ORAL | Status: DC | PRN

## 2012-03-30 MED ORDER — SODIUM CHLORIDE 0.9 % IV SOLN
INTRAVENOUS | Status: DC
  Administered 2012-03-31 (×2): via INTRAVENOUS
  Filled 2012-03-30 (×5): qty 1000

## 2012-03-30 MED ORDER — SODIUM CHLORIDE 0.9 % IV BOLUS (SEPSIS)
2000.0000 mL | Freq: Once | INTRAVENOUS | Status: AC
  Administered 2012-03-30: 1000 mL via INTRAVENOUS

## 2012-03-30 MED ORDER — MORPHINE SULFATE 4 MG/ML IJ SOLN
4.0000 mg | Freq: Once | INTRAMUSCULAR | Status: AC
  Administered 2012-03-30: 4 mg via INTRAVENOUS
  Filled 2012-03-30: qty 1

## 2012-03-30 MED ORDER — CEPHALEXIN 500 MG PO CAPS: 500.0000 mg | ORAL_CAPSULE | Freq: Four times a day (QID) | ORAL | Status: DC

## 2012-03-30 MED ORDER — IBUPROFEN 400 MG PO TABS: 600.0000 mg | ORAL_TABLET | Freq: Three times a day (TID) | ORAL | Status: DC

## 2012-03-30 MED ORDER — IBUPROFEN 800 MG PO TABS
800.0000 mg | ORAL_TABLET | Freq: Once | ORAL | Status: AC
  Administered 2012-03-30: 800 mg via ORAL
  Filled 2012-03-30: qty 1

## 2012-03-30 NOTE — ED Provider Notes (Signed)
History     CSN: [id number]  Arrival date & time 03/30/12  2044   First MD Initiated Contact with Patient 03/30/12 2101      Chief Complaint  Patient presents with  . Fever  . Emesis    (Consider location/radiation/quality/duration/timing/severity/associated sxs/prior treatment) Patient is a 21 y.o. female presenting with fever. The history is provided by the patient.  Fever Primary symptoms of the febrile illness include fever, headaches, abdominal pain, nausea and vomiting. Primary symptoms do not include cough, shortness of breath or diarrhea. The current episode started 3 to 5 days ago. This is a new problem. The problem has been gradually worsening.  The maximum temperature recorded prior to her arrival was 103 to 104 F.  The abdominal pain is located in the suprapubic region.  Nausea began 3 to 5 days ago.  The vomiting began yesterday.  Associated with: nothing.    Past Medical History  Diagnosis Date  . No pertinent past medical history     Past Surgical History  Procedure Date  . Cesarean section   . Cesarean section 02/29/2012    Procedure: CESAREAN SECTION;  Surgeon: Tilda Burrow, MD;  Location: [facility];  Service: Gynecology;  Laterality: N/A;  Repeat     Family History  Problem Relation Age of Onset  . Hypertension Mother     History  Substance Use Topics  . Smoking status: Former Smoker    Types: Cigarettes  . Smokeless tobacco: Not on file  . Alcohol Use: No    OB History    Grav Para Term Preterm Abortions TAB SAB Ect Mult Living   2 2 2  0      2      Review of Systems  Constitutional: Positive for fever. Negative for chills.  HENT: Negative for congestion, rhinorrhea and neck pain.   Eyes: Negative.   Respiratory: Negative for cough and shortness of breath.   Cardiovascular: Negative for chest pain and leg swelling.  Gastrointestinal: Positive for nausea, vomiting and abdominal pain. Negative for diarrhea and constipation.    Genitourinary: Negative for urgency, decreased urine volume and difficulty urinating.  Musculoskeletal:       "body aches"  Neurological: Positive for headaches.  All other systems reviewed and are negative.    Allergies  Review of patient's allergies indicates no known allergies.  Home Medications   Current Outpatient Rx  Name Route Sig Dispense Refill  . CEPHALEXIN 500 MG PO CAPS Oral Take 1 capsule (500 mg total) by mouth 4 (four) times daily. 28 capsule 0  . IBUPROFEN 200 MG PO TABS Oral Take 400 mg by mouth every 8 (eight) hours as needed. For pain      BP 120/65  Pulse 143  Temp 103.1 F (39.5 C) (Oral)  Resp 20  SpO2 100%  Physical Exam  Constitutional: She is oriented to person, place, and time. She appears well-developed and well-nourished. No distress.  HENT:  Head: Normocephalic and atraumatic.  Right Ear: External ear normal.  Left Ear: External ear normal.  Nose: Nose normal.  Mouth/Throat: Oropharynx is clear and moist.  Neck: Neck supple.  Cardiovascular: Normal rate, regular rhythm, normal heart sounds and intact distal pulses.   Pulmonary/Chest: Effort normal and breath sounds normal. No respiratory distress. She has no wheezes. She has no rales.  Abdominal: Soft. She exhibits no distension. There is tenderness in the suprapubic area. There is CVA tenderness (right sided).  Musculoskeletal: She exhibits no edema.  Lymphadenopathy:  She has no cervical adenopathy.  Neurological: She is alert and oriented to person, place, and time.  Skin: Skin is warm and dry. She is not diaphoretic. No pallor.    ED Course  Procedures (including critical care time)   Labs Reviewed  LACTIC ACID, PLASMA  PROCALCITONIN  CULTURE, BLOOD (ROUTINE X 2)  CULTURE, BLOOD (ROUTINE X 2)  URINE CULTURE  CBC  CREATININE, SERUM  MAGNESIUM  CBC  COMPREHENSIVE METABOLIC PANEL   Dg Chest 2 View  03/30/2012  *RADIOLOGY REPORT*  Clinical Data: Body aches and fever.   Vomiting.  CHEST - 2 VIEW  Comparison: None.  Findings: Trachea is midline.  Heart size normal.  Lungs are clear. No pleural fluid.  IMPRESSION: No acute findings.  Original Report Authenticated By: Reyes Ivan, M.D.     1. Pyelonephritis   2. Fever       MDM  3 days of right back/flank pain, suprapubic tenderness and fevers. Just discharged from CDU after getting fluids and rocephin and being discharged. Now febrile to 103. Tachycardic, which improved with fluids and tylenol. No hypotension in ED. No other symptoms such as respiratory or neurologic. As fever has worsened she's had a headache, but normal neuro exam and no signs of neck stiffness/meningismus. Already received rocephin today, will treat symptomatically and admit to hospitalist for pyelo Tx.         Pricilla Loveless, MD 03/31/12 [phone]

## 2012-03-30 NOTE — ED Notes (Signed)
Patient transported to X-ray 

## 2012-03-30 NOTE — ED Notes (Signed)
Patient currently resting quietly in bed; no respiratory or acute distress noted.  EDP currently at bedside.  Will continue to monitor.

## 2012-03-30 NOTE — ED Notes (Signed)
Patient currently sitting up in bed; no respiratory or acute distress noted.  Patient updated on plan of care; informed patient that the EDP has put in a consult to internal medicine.  Patient has no other questions or concerns at this time; will continue to monitor.

## 2012-03-30 NOTE — ED Provider Notes (Signed)
History     CSN: [id number]  Arrival date & time 03/30/12  1049   First MD Initiated Contact with Patient 03/30/12 1224      Chief Complaint  Patient presents with  . Nausea  . Fever    (Consider location/radiation/quality/duration/timing/severity/associated sxs/prior treatment) HPI Patient since emergency department with fever, body aches, and nausea over the last 3 days.  Patient states that she vomited twice 3 days ago, but has not vomited since.  Patient states that she been running fevers that do not seem to be relieved with Motrin.  Her last dose of Motrin was yesterday.  Patient states that she had a C-section  on July 19 100 and the complications from this procedure.  Patient states, that she's breast-feeding her child.  Patient denies chest pain, shortness of breath, cough, abdominal pain, diarrhea, dizziness, syncope, visual changes or headache.  Patient states that nothing seems to make her body aches better.  She does, state she's not been taking Motrin consistently. Past Medical History  Diagnosis Date  . No pertinent past medical history     Past Surgical History  Procedure Date  . Cesarean section   . Cesarean section 02/29/2012    Procedure: CESAREAN SECTION;  Surgeon: Tilda Burrow, MD;  Location: [facility];  Service: Gynecology;  Laterality: N/A;  Repeat     Family History  Problem Relation Age of Onset  . Hypertension Mother     History  Substance Use Topics  . Smoking status: Former Smoker    Types: Cigarettes  . Smokeless tobacco: Not on file  . Alcohol Use: No    OB History    Grav Para Term Preterm Abortions TAB SAB Ect Mult Living   2 2 2  0      2      Review of Systems All other systems negative except as documented in the HPI. All pertinent positives and negatives as reviewed in the HPI.  Allergies  Review of patient's allergies indicates no known allergies.  Home Medications   Current Outpatient Rx  Name Route Sig Dispense Refill  .  IBUPROFEN 200 MG PO TABS Oral Take 400 mg by mouth every 8 (eight) hours as needed. For pain      BP 118/73  Pulse 110  Temp 103 F (39.4 C) (Oral)  Resp 18  SpO2 98%  Physical Exam  Nursing note and vitals reviewed. Constitutional: She is oriented to person, place, and time. She appears well-developed and well-nourished. No distress.  HENT:  Head: Normocephalic and atraumatic.  Mouth/Throat: Oropharynx is clear and moist.  Eyes: Conjunctivae are normal. Pupils are equal, round, and reactive to light.  Neck: Normal range of motion. Neck supple.  Cardiovascular: Regular rhythm and normal heart sounds.  Tachycardia present.  Exam reveals no gallop and no friction rub.   No murmur heard. Pulmonary/Chest: Effort normal and breath sounds normal. No respiratory distress. She has no wheezes.  Abdominal: Soft. Bowel sounds are normal. She exhibits no distension. There is no tenderness. There is no guarding.  Lymphadenopathy:    She has no cervical adenopathy.  Neurological: She is alert and oriented to person, place, and time.  Skin: Skin is warm and dry.    ED Course  Procedures (including critical care time)  Labs Reviewed  COMPREHENSIVE METABOLIC PANEL - Abnormal; Notable for the following:    Potassium 3.3 (*)     Glucose, Bld 105 (*)     Albumin 3.2 (*)  All other components within normal limits  CBC WITH DIFFERENTIAL - Abnormal; Notable for the following:    WBC 17.4 (*)     RDW 15.6 (*)     Neutrophils Relative 81 (*)     Neutro Abs 14.0 (*)     Lymphocytes Relative 10 (*)     Monocytes Absolute 1.5 (*)     All other components within normal limits  URINALYSIS, ROUTINE W REFLEX MICROSCOPIC - Abnormal; Notable for the following:    APPearance CLOUDY (*)     Hgb urine dipstick SMALL (*)     Protein, ur 30 (*)     Nitrite POSITIVE (*)     Leukocytes, UA MODERATE (*)     All other components within normal limits  URINE MICROSCOPIC-ADD ON - Abnormal; Notable for the  following:    Bacteria, UA MANY (*)     All other components within normal limits  POCT PREGNANCY, URINE   Dg Chest 2 View  03/30/2012  *RADIOLOGY REPORT*  Clinical Data: Body aches and fever.  Vomiting.  CHEST - 2 VIEW  Comparison: None.  Findings: Trachea is midline.  Heart size normal.  Lungs are clear. No pleural fluid.  IMPRESSION: No acute findings.  Original Report Authenticated By: Reyes Ivan, M.D.    Patient be placed in the CDU on the pyelonephritis protocol.  Report was given to the CDU, PA-C.  Patient's labs and x-rays were reviewed by me.  Patient's been stable here in the emergency department IV fluids have been given.   MDM   MDM Reviewed: vitals and nursing note Interpretation: labs and x-ray           Carlyle Dolly, PA-C 03/30/12 1451

## 2012-03-30 NOTE — ED Provider Notes (Signed)
2:52 PM Patient moved to CDU for observation, pyelonephritis protocol.  Sign out received from [facility], PA-C.  Pt with fever, myalgia, nausea; found to have elevated WBC and UTI.  Pt to be treated in CDU for pyelonephritis.  Pt also recently underwent c-section without complications July 19, healing well.  Pt is occasionally breastfeeding, every few days, so Thayer Ohm has discussed safe antibiotics with Dr Freida Busman (attending) and Dr Danae Orleans (peds ED attending).  Plan is for patient to get rocephin in CDU, be reassessed, anticipate d/c home with keflex.  Pt currently reports she is feeling about the same, is eating crackers and drinking ginger ale to treat nausea; has had no vomiting.  Patient is A&Ox4, NAD, RRR, CTAB, abd soft, mild epigastric tenderness, no guarding, no rebound, no CVA tenderness, surgical incision is healing well, steri strips still in place.  No needs at this time.  Urine culture ordered.  3:20 PM  Pt signed out to Arthor Captain, PA-C, at change of shift.    [place], [place] 03/30/12 1522  [place], [place] 03/30/12 1524

## 2012-03-30 NOTE — ED Notes (Signed)
Pt reports 2 day hx of nausea, dry mouth, fever, and generalized bodyaches. Denies abdominal pain.

## 2012-03-30 NOTE — ED Notes (Signed)
Phlebotomist at bedside obtaining two sets of blood cultures.

## 2012-03-30 NOTE — ED Notes (Signed)
Patient seen earlier for pyelonephritis, was given Rx to fill.  Patient now with fever and unable to keep medications down due to nausea and vomiting.  Patient also with shaking and cold chills.

## 2012-03-30 NOTE — H&P (Signed)
[facility] History and Physical  [name]  WUJ:[id number]  DOB: Feb 07, 1991   DOA: 03/30/2012   PCP:   Dorrene German, MD   Chief Complaint:  Feeling sick for 3 days  HPI: Jasmine Hudson is an 21 y.o. female.   Obese African American lady delivered her second child by cesarean section one month ago, July 19, 3 days ago began having headaches fever chills vomiting weakness, and came to the emergency room this morning to be evaluated. She she was diagnosed with pyelonephritis and admitted to CDU to initiate treatment with intravenous antibiotic. She felt improved within the day and was discharged home, but on arrival to home began to vomit again spiked a temperature to 103 developed right flank pain and return to the emergency room and hospitalist service was called to assist with management.\  The patient has been found to be persistently tachycardic, and her blood pressures are now in the 80s. She however says she feels much better since she has gotten some fluids again in the emergency room.   She has had urinary tract infections in the past but has never been this sick. Her last urinary tract infection was when she was [redacted] weeks pregnant in May of this year.  Rewiew of Systems:   All systems negative except as marked bold or noted in the HPI;  Constitutional: Negative for malaise, fever and chills. ;  Eyes: Negative for eye pain, redness and discharge. ;  ENMT: Negative for ear pain, hoarseness, nasal congestion, sinus pressure and sore throat. ;  Cardiovascular: Negative for chest pain, palpitations, diaphoresis, dyspnea and peripheral edema. ;  Respiratory: Negative for cough, hemoptysis, wheezing and stridor. ;  Gastrointestinal: Negative for iarrhea, constipation, abdominal pain, melena, blood in stool, hematemesis, jaundice and rectal bleeding. unusual weight loss..   Genitourinary: Negative for frequency, dysuria, incontinence,flank pain and hematuria; Musculoskeletal:  Negative for b neck pain. Negative for swelling and trauma.;  Skin: . Negative for pruritus, rash, abrasions, bruising and skin lesion.; ulcerations Neuro: Negative for headache,  neck stiffness. Negative for weakness, altered level of consciousness , altered mental status, extremity weakness, burning feet, involuntary movement, seizure and syncope.  Psych: negative for anxiety, depression, insomnia, tearfulness, panic attacks, hallucinations, paranoia, suicidal or homicidal ideation    Past Medical History  Diagnosis Date  . No pertinent past medical history     Past Surgical History  Procedure Date  . Cesarean section   . Cesarean section 02/29/2012    Procedure: CESAREAN SECTION;  Surgeon: Tilda Burrow, MD;  Location: [facility];  Service: Gynecology;  Laterality: N/A;  Repeat     Medications:  HOME MEDS: Prior to Admission medications   Medication Sig Start Date End Date Taking? Authorizing Provider  cephALEXin (KEFLEX) 500 MG capsule Take 500 mg by mouth 4 (four) times daily. 03/30/12 04/09/12 Yes Abigail Harris, PA-C  ibuprofen (ADVIL,MOTRIN) 200 MG tablet Take 400 mg by mouth every 8 (eight) hours as needed. For pain   Yes Historical Provider, MD     Allergies:  No Known Allergies  Social History:   reports that she has quit smoking. Her smoking use included Cigarettes. She does not have any smokeless tobacco history on file. She reports that she does not drink alcohol or use illicit drugs.  Family History: Family History  Problem Relation Age of Onset  . Hypertension Mother      Physical Exam: Filed Vitals:   03/30/12 2049 03/30/12 2134  BP: 120/65 101/55  Pulse: 143 120  Temp: 103.1 F (39.5 C) 102.8 F (39.3 C)  TempSrc: Oral Oral  Resp: 20 18  SpO2: 100% 98%   Blood pressure 101/55, pulse 120, temperature 102.8 F (39.3 C), temperature source Oral, resp. rate 18, SpO2 98.00%.  GEN:  Pleasant obese young African American lady lying in the stretcher,  ill-looking; cooperative with exam PSYCH:  alert and oriented x4; does not appear anxious or depressed; affect is appropriate. HEENT: Mucous membranes pink, dry, and anicteric; PERRLA; EOM intact; no cervical lymphadenopathy nor thyromegaly or carotid bruit; no JVD; Breasts:: Not examined CHEST WALL: No tenderness CHEST: Normal respiration, clear to auscultation bilaterally HEART: Tachycardic regular rhythm; no murmurs rubs or gallops BACK: No kyphosis or scoliosis; no CVA tenderness ABDOMEN: Obese, soft non-tender; no masses, Steri-Strips over the healing lower abdominal surgical scar no organomegaly, normal abdominal bowel sounds;  no intertriginous candida. Rectal Exam: Not done EXTREMITIES: No bone or joint deformity; no edema; no ulcerations. Genitalia: not examined PULSES: 2+ and symmetric SKIN: Normal hydration no rash or ulceration CNS: Cranial nerves 2-12 grossly intact no focal lateralizing neurologic deficit   Labs on Admission:  Basic Metabolic Panel:  Lab 03/30/12 1610  NA 137  K 3.3*  CL 101  CO2 26  GLUCOSE 105*  BUN 7  CREATININE 0.79  CALCIUM 9.0  MG --  PHOS --   Liver Function Tests:  Lab 03/30/12 1243  AST 20  ALT 17  ALKPHOS 80  BILITOT 0.4  PROT 7.0  ALBUMIN 3.2*   No results found for this basename: LIPASE:5,AMYLASE:5 in the last 168 hours No results found for this basename: AMMONIA:5 in the last 168 hours CBC:  Lab 03/30/12 1243  WBC 17.4*  NEUTROABS 14.0*  HGB 12.4  HCT 37.4  MCV 83.9  PLT 207   Cardiac Enzymes: No results found for this basename: CKTOTAL:5,CKMB:5,CKMBINDEX:5,TROPONINI:5 in the last 168 hours BNP: No components found with this basename: POCBNP:5 CBG: No results found for this basename: GLUCAP:5 in the last 168 hours  Results for orders placed during the hospital encounter of 03/30/12 (from the past 48 hour(s))  COMPREHENSIVE METABOLIC PANEL     Status: Abnormal   Collection Time   03/30/12 12:43 PM      Component  Value Range Comment   Sodium 137  135 - 145 mEq/L    Potassium 3.3 (*) 3.5 - 5.1 mEq/L    Chloride 101  96 - 112 mEq/L    CO2 26  19 - 32 mEq/L    Glucose, Bld 105 (*) 70 - 99 mg/dL    BUN 7  6 - 23 mg/dL    Creatinine, Ser 9.60  0.50 - 1.10 mg/dL    Calcium 9.0  8.4 - 45.4 mg/dL    Total Protein 7.0  6.0 - 8.3 g/dL    Albumin 3.2 (*) 3.5 - 5.2 g/dL    AST 20  0 - 37 U/L    ALT 17  0 - 35 U/L    Alkaline Phosphatase 80  39 - 117 U/L    Total Bilirubin 0.4  0.3 - 1.2 mg/dL    GFR calc non Af Amer >90  >90 mL/min    GFR calc Af Amer >90  >90 mL/min   CBC WITH DIFFERENTIAL     Status: Abnormal   Collection Time   03/30/12 12:43 PM      Component Value Range Comment   WBC 17.4 (*) 4.0 - 10.5 K/uL  RBC 4.46  3.87 - 5.11 MIL/uL    Hemoglobin 12.4  12.0 - 15.0 g/dL    HCT 16.1  09.6 - 04.5 %    MCV 83.9  78.0 - 100.0 fL    MCH 27.8  26.0 - 34.0 pg    MCHC 33.2  30.0 - 36.0 g/dL    RDW 40.9 (*) 81.1 - 15.5 %    Platelets 207  150 - 400 K/uL    Neutrophils Relative 81 (*) 43 - 77 %    Neutro Abs 14.0 (*) 1.7 - 7.7 K/uL    Lymphocytes Relative 10 (*) 12 - 46 %    Lymphs Abs 1.7  0.7 - 4.0 K/uL    Monocytes Relative 9  3 - 12 %    Monocytes Absolute 1.5 (*) 0.1 - 1.0 K/uL    Eosinophils Relative 0  0 - 5 %    Eosinophils Absolute 0.1  0.0 - 0.7 K/uL    Basophils Relative 0  0 - 1 %    Basophils Absolute 0.0  0.0 - 0.1 K/uL   URINALYSIS, ROUTINE W REFLEX MICROSCOPIC     Status: Abnormal   Collection Time   03/30/12 12:43 PM      Component Value Range Comment   Color, Urine YELLOW  YELLOW    APPearance CLOUDY (*) CLEAR    Specific Gravity, Urine 1.013  1.005 - 1.030    pH 6.0  5.0 - 8.0    Glucose, UA NEGATIVE  NEGATIVE mg/dL    Hgb urine dipstick SMALL (*) NEGATIVE    Bilirubin Urine NEGATIVE  NEGATIVE    Ketones, ur NEGATIVE  NEGATIVE mg/dL    Protein, ur 30 (*) NEGATIVE mg/dL    Urobilinogen, UA 1.0  0.0 - 1.0 mg/dL    Nitrite POSITIVE (*) NEGATIVE    Leukocytes, UA  MODERATE (*) NEGATIVE   URINE MICROSCOPIC-ADD ON     Status: Abnormal   Collection Time   03/30/12 12:43 PM      Component Value Range Comment   Squamous Epithelial / LPF RARE  RARE    WBC, UA 7-10  <3 WBC/hpf    RBC / HPF 3-6  <3 RBC/hpf    Bacteria, UA MANY (*) RARE    Urine-Other TRICHOMONAS PRESENT     POCT PREGNANCY, URINE     Status: Normal   Collection Time   03/30/12  1:04 PM      Component Value Range Comment   Preg Test, Ur NEGATIVE  NEGATIVE      Radiological Exams on Admission: Dg Chest 2 View  03/30/2012  *RADIOLOGY REPORT*  Clinical Data: Body aches and fever.  Vomiting.  CHEST - 2 VIEW  Comparison: None.  Findings: Trachea is midline.  Heart size normal.  Lungs are clear. No pleural fluid.  IMPRESSION: No acute findings.  Original Report Authenticated By: Reyes Ivan, M.D.      Assessment/Plan Present on Admission:  .Pyelonephritis .Hypotension .SIRS (systemic inflammatory response syndrome)   PLAN: Admit this lady for intravenous fluid resuscitation and IV antibiotic; will monitor her on telemetry welsh her cardiovascular system remains somewhat tenuous; if blood pressure not improving within the next hour consider the moving to step down for pressors;  Other plans as per orders.  Code Status: FULL CODE Family Communication: Elvera Bicker, Mother [phone]), as discussed patient's diagnosis and prognosis with mother on phone    Willian Donson Nocturnist [facility] Pager [phone]   03/30/2012, 11:03 PM

## 2012-03-30 NOTE — ED Notes (Signed)
Patient states that she was seen here earlier today and diagnosed with pyelonephritis.  Patient was given IV antibiotics and sent home with a prescription.  Patient states that she has been unable to keep medications down; reports 3-4 episodes of emesis since discharge.  Patient also complaining of fever; temperature 103.1 in triage; EDP notified.  Patient complaining of generalized body aches and chills.  Upon arrival to room, patient changed into gown and connected to continuous cardiac, pulse ox, and blood pressure monitor.  IV started.  Will continue to monitor.

## 2012-03-30 NOTE — ED Provider Notes (Signed)
4:16 PM BP 111/63  Pulse 93  Temp 99.9 F (37.7 C) (Oral)  Resp 16  SpO2 100% Assumed care of patient from [facility], [place]. Patient arrives with complaint of fever myalgia and nausea. She was positive for elevated white blood cell count and urinary tract infection she is here in the CDU currently on pyelo-protocol. She has been given 1 g IV Rocephin. Plan is to discharge the patient home on Keflex, if tachycardia and fever has resolved and patient is able to ambulate and feels well. Urine culture is pending. CV: RRR, No M/R/G, Peripheral pulses intact. No peripheral edema. Lungs: CTAB Abd: Soft, Non tender, non distended Patient is able to ambulate without assistance, I will d/c home and she can f/u with her gyn this week. Discussed reasons to seek immediate care. Patient expresses understanding and agrees with plan.   Arthor Captain, PA-C 03/30/12 1700

## 2012-03-30 NOTE — ED Notes (Signed)
C/o fever, generalized aches, nausea x 2 days. Emesis x 1 two days ago. Denies diarrhea. Reports had baby by C-section 7-19. Denies cold, cough, sore throat, urinary symptoms.

## 2012-03-31 ENCOUNTER — Encounter (HOSPITAL_COMMUNITY): Payer: Self-pay | Admitting: *Deleted

## 2012-03-31 DIAGNOSIS — R509 Fever, unspecified: Secondary | ICD-10-CM

## 2012-03-31 LAB — CBC
HCT: 32.9 % — ABNORMAL LOW (ref 36.0–46.0)
Hemoglobin: 10.7 g/dL — ABNORMAL LOW (ref 12.0–15.0)
MCV: 85.5 fL (ref 78.0–100.0)
RBC: 3.85 MIL/uL — ABNORMAL LOW (ref 3.87–5.11)
RDW: 16 % — ABNORMAL HIGH (ref 11.5–15.5)
WBC: 17.8 10*3/uL — ABNORMAL HIGH (ref 4.0–10.5)

## 2012-03-31 LAB — COMPREHENSIVE METABOLIC PANEL
BUN: 5 mg/dL — ABNORMAL LOW (ref 6–23)
CO2: 25 mEq/L (ref 19–32)
Chloride: 110 mEq/L (ref 96–112)
Creatinine, Ser: 0.72 mg/dL (ref 0.50–1.10)
GFR calc Af Amer: >90 mL/min (ref >90)
GFR calc non Af Amer: >90 mL/min (ref >90)
Glucose, Bld: 88 mg/dL (ref 70–99)
Total Bilirubin: 0.4 mg/dL (ref 0.3–1.2)

## 2012-03-31 MED ORDER — SODIUM CHLORIDE 0.9 % IJ SOLN: 3.0000 mL | Freq: Two times a day (BID) | INTRAMUSCULAR | Status: DC

## 2012-03-31 MED ORDER — BISACODYL 5 MG PO TBEC: 5.0000 mg | DELAYED_RELEASE_TABLET | Freq: Every day | ORAL | Status: DC | PRN

## 2012-03-31 MED ORDER — ENOXAPARIN SODIUM 40 MG/0.4ML ~~LOC~~ SOLN
40.0000 mg | SUBCUTANEOUS | Status: DC
  Administered 2012-03-31: 40 mg via SUBCUTANEOUS
  Filled 2012-03-31 (×2): qty 0.4

## 2012-03-31 MED ORDER — OXYCODONE HCL 5 MG PO TABS: 5.0000 mg | ORAL_TABLET | ORAL | Status: DC | PRN

## 2012-03-31 MED ORDER — TRAZODONE 25 MG HALF TABLET
25.0000 mg | ORAL_TABLET | Freq: Every evening | ORAL | Status: DC | PRN
  Filled 2012-03-31: qty 1

## 2012-03-31 MED ORDER — DEXTROSE 5 % IV SOLN
1.0000 g | INTRAVENOUS | Status: DC
  Administered 2012-03-31: 1 g via INTRAVENOUS
  Filled 2012-03-31 (×2): qty 10

## 2012-03-31 MED ORDER — ONDANSETRON HCL 4 MG/2ML IJ SOLN: 4.0000 mg | INTRAMUSCULAR | Status: DC | PRN

## 2012-03-31 MED ORDER — ACETAMINOPHEN 325 MG PO TABS
650.0000 mg | ORAL_TABLET | ORAL | Status: DC | PRN
  Administered 2012-03-31: 650 mg via ORAL
  Filled 2012-03-31: qty 2

## 2012-03-31 MED ORDER — HYDROMORPHONE HCL PF 1 MG/ML IJ SOLN: 0.5000 mg | INTRAMUSCULAR | Status: DC | PRN

## 2012-03-31 MED ORDER — ACETAMINOPHEN 650 MG RE SUPP: 650.0000 mg | Freq: Four times a day (QID) | RECTAL | Status: DC | PRN

## 2012-03-31 MED ORDER — FLEET ENEMA 7-19 GM/118ML RE ENEM
1.0000 | ENEMA | Freq: Once | RECTAL | Status: AC | PRN
  Filled 2012-03-31: qty 1

## 2012-03-31 NOTE — ED Provider Notes (Signed)
Medical screening examination/treatment/procedure(s) were performed by non-physician practitioner and as supervising physician I was immediately available for consultation/collaboration.  Toy Baker, MD 03/31/12 [phone]

## 2012-03-31 NOTE — ED Provider Notes (Signed)
I saw and evaluated the patient, reviewed the resident's note and I agree with the findings and plan.  Derwood Kaplan, MD 03/31/12 [phone]

## 2012-03-31 NOTE — ED Provider Notes (Signed)
Medical screening examination/treatment/procedure(s) were performed by non-physician practitioner and as supervising physician I was immediately available for consultation/collaboration.  Soua Lenk T Purvis Sidle, MD 03/31/12 0835 

## 2012-03-31 NOTE — Progress Notes (Signed)
[facility] PROGRESS NOTE  Jasmine Hudson ZOX:[id number] DOB: 02/02/1991 DOA: 03/30/2012 PCP: Dorrene German, MD  Assessment/Plan: Active Problems:  Pyelonephritis  Hypotension  SIRS (systemic inflammatory response syndrome)  1. Sirs-resolved 2. Acute pyelonephritis-improving-continue IV antibiotics. Await cultures 3. Hypotension-resolved with IV fluids  Antibiotics:  Rocephin  HPI/Subjective: Feels better  Objective: Filed Vitals:   03/31/12 0543 03/31/12 0944 03/31/12 1327 03/31/12 1755  BP: 93/60 97/63 103/70 113/76  Pulse: 76 84 96 87  Temp: 97.6 F (36.4 C) 98.2 F (36.8 C) 99.2 F (37.3 C) 98.1 F (36.7 C)  TempSrc: Oral Oral Oral Oral  Resp: 20 18 18 18   Height:      Weight: 98.7 kg (217 lb 9.5 oz)     SpO2: 100% 100% 100% 100%    Intake/Output Summary (Last 24 hours) at 03/31/12 1850 Last data filed at 03/31/12 1300  Gross per 24 hour  Intake 1132.5 ml  Output    800 ml  Net  332.5 ml   Filed Weights   03/31/12 0051 03/31/12 0543  Weight: 98.7 kg (217 lb 9.5 oz) 98.7 kg (217 lb 9.5 oz)    Exam:   General:  Alert and oriented x3  Cardiovascular: Regular rate and rhythm  Respiratory: Clear to auscultation bilaterally  Abdomen: Soft, significant suprapubic tenderness  Data Reviewed: Basic Metabolic Panel:  Lab 03/31/12 8119 03/30/12 1243  NA 143 137  K 3.4* 3.3*  CL 110 101  CO2 25 26  GLUCOSE 88 105*  BUN 5* 7  CREATININE 0.72 0.79  CALCIUM 8.1* 9.0  MG 1.8 --  PHOS -- --   Liver Function Tests:  Lab 03/31/12 0606 03/30/12 1243  AST 22 20  ALT 14 17  ALKPHOS 91 80  BILITOT 0.4 0.4  PROT 6.0 7.0  ALBUMIN 2.4* 3.2*   No results found for this basename: LIPASE:5,AMYLASE:5 in the last 168 hours No results found for this basename: AMMONIA:5 in the last 168 hours CBC:  Lab 03/31/12 0606 03/30/12 1243  WBC 17.8* 17.4*  NEUTROABS -- 14.0*  HGB 10.7* 12.4  HCT 32.9* 37.4  MCV 85.5 83.9  PLT 183 207   Cardiac  Enzymes: No results found for this basename: CKTOTAL:5,CKMB:5,CKMBINDEX:5,TROPONINI:5 in the last 168 hours BNP (last 3 results) No results found for this basename: PROBNP:3 in the last 8760 hours CBG: No results found for this basename: GLUCAP:5 in the last 168 hours  No results found for this or any previous visit (from the past 240 hour(s)).   Studies: Dg Chest 2 View  03/30/2012  *RADIOLOGY REPORT*  Clinical Data: Body aches and fever.  Vomiting.  CHEST - 2 VIEW  Comparison: None.  Findings: Trachea is midline.  Heart size normal.  Lungs are clear. No pleural fluid.  IMPRESSION: No acute findings.  Original Report Authenticated By: Reyes Ivan, M.D.    Scheduled Meds:    . acetaminophen  1,000 mg Oral Once  . cefTRIAXone (ROCEPHIN)  IV  1 g Intravenous Q24H  . enoxaparin (LOVENOX) injection  40 mg Subcutaneous Q24H  .  morphine injection  4 mg Intravenous Once  . ondansetron (ZOFRAN) IV  4 mg Intravenous Once  . sodium chloride  1,000 mL Intravenous Once  . sodium chloride  3 mL Intravenous Q12H  . DISCONTD: cefTRIAXone (ROCEPHIN)  IV  1 g Intravenous Q24H   Continuous Infusions:    . DISCONTD: sodium chloride 0.9 % 1,000 mL with potassium chloride 20 mEq infusion 150 mL/hr at 03/31/12  0900    Active Problems:  Pyelonephritis  Hypotension  SIRS (systemic inflammatory response syndrome)    Time spent: 50 minutes - answering questions for patient, her mother and her grandfather    Catie Chiao  [facility] Pager [phone]. If 8PM-8AM, please contact night-coverage at [email or web address], password [facility] 03/31/2012, 6:50 PM  LOS: 1 day

## 2012-03-31 NOTE — Progress Notes (Signed)
Brief Nutrition Note  RD pulled to chart for pt who is breastfeeding, had 2nd child via cesarean section July 19th. Pt reports she is breastfeeding intermittently, but not every day.  RD advised pt to confirm with MD safety of breastfeeding with current medications.  Pt states she has good appetite and denies needs for snacks between meals.  Body mass index is 34.08 kg/(m^2). Pt is obese class 1.  No nutrition interventions needed at this time. Please consult if nutrition needs arise.   Clarene Duke RD, LDN Pager [phone] After Hours pager [phone]

## 2012-03-31 NOTE — ED Notes (Signed)
Report called to [facility] RN [phone]

## 2012-03-31 NOTE — ED Provider Notes (Signed)
Medical screening examination/treatment/procedure(s) were performed by non-physician practitioner and as supervising physician I was immediately available for consultation/collaboration.  Dimitri Dsouza T Lavona Norsworthy, MD 03/31/12 0835 

## 2012-04-01 LAB — URINE CULTURE
Colony Count: NO GROWTH
Culture: NO GROWTH
Special Requests: NORMAL

## 2012-04-01 MED ORDER — ONDANSETRON HCL 4 MG PO TABS: 4.0000 mg | ORAL_TABLET | Freq: Three times a day (TID) | ORAL | Status: AC | PRN

## 2012-04-01 NOTE — Progress Notes (Signed)
Pt. discharge to floor,verbalized understanding of discharged instruction,medication,restriction,diet and follow up appointment.Baseline Vitals sign stable,Pt comfortable,no sign and symptom of distress. 

## 2012-04-01 NOTE — Care Management Note (Signed)
    Page 1 of 1   04/01/2012     3:53:56 PM   CARE MANAGEMENT NOTE 04/01/2012  Patient:  Jasmine Hudson, Jasmine Hudson   Account Number:  [id number]  Date Initiated:  03/31/2012  Documentation initiated by:  Letha Cape  Subjective/Objective Assessment:   dx peylonephritis  admit- lives with spouse.     Action/Plan:   Anticipated DC Date:  04/01/2012   Anticipated DC Plan:  HOME/SELF CARE      DC Planning Services  CM consult      Choice offered to / List presented to:             Status of service:  Completed, signed off Medicare Important Message given?   (If response is "NO", the following Medicare IM given date fields will be blank) Date Medicare IM given:   Date Additional Medicare IM given:    Discharge Disposition:  HOME/SELF CARE  Per UR Regulation:  Reviewed for med. necessity/level of care/duration of stay  If discussed at Long Length of Stay Meetings, dates discussed:    Comments:  04/01/12 15:51 Letha Cape RN, BSN [phone] patient lives with spouse, pta independent.  Patient dc to home no needs .

## 2012-04-01 NOTE — Discharge Summary (Signed)
Physician Discharge Summary  Jasmine Hudson BJY:[id number] DOB: [birth year] DOA: 03/30/2012  PCP: Jasmine German, MD  Admit date: 03/30/2012 Discharge date: 04/01/2012  Discharge Diagnoses:  Active Problems:  Pyelonephritis  Hypotension  SIRS (systemic inflammatory response syndrome)   Discharge Condition: good  Diet recommendation: regular  Filed Weights   03/31/12 0051 03/31/12 0543 04/01/12 0415  Weight: 98.7 kg (217 lb 9.5 oz) 98.7 kg (217 lb 9.5 oz) 98.7 kg (217 lb 9.5 oz)    History of present illness:  21 yo woman admitted with pyelo and sirs  Hospital Course:  21 yo woman was admitted with pyelo and SIRS. She was started on iv Rocephin after blood cultures were obtained. Blood cultures did not show any growth. Patient defervesced and by HOD no 2.She was able to tolerate a regular diet.   Procedures:  noe  Consultations:  none  Discharge Exam: Filed Vitals:   04/01/12 0415  BP: 110/61  Pulse: 81  Temp: 99.2 F (37.3 C)  Resp: 20   Filed Vitals:   03/31/12 1755 03/31/12 2156 04/01/12 0018 04/01/12 0415  BP: 113/76 122/68 102/53 110/61  Pulse: 87 86 84 81  Temp: 98.1 F (36.7 C) 97.7 F (36.5 C) 98.8 F (37.1 C) 99.2 F (37.3 C)  TempSrc: Oral Oral Oral Oral  Resp: 18 18 20 20   Height:      Weight:    98.7 kg (217 lb 9.5 oz)  SpO2: 100% 100% 98% 100%    General: axox3 Cardiovascular: RRR Respiratory: CTAB  Discharge Instructions  Discharge Orders    Future Orders Please Complete By Expires   Diet general      Increase activity slowly        Medication List  As of 04/01/2012  8:47 AM   TAKE these medications         cephALEXin 500 MG capsule   Commonly known as: KEFLEX   Take 500 mg by mouth 4 (four) times daily.      ibuprofen 200 MG tablet   Commonly known as: ADVIL,MOTRIN   Take 400 mg by mouth every 8 (eight) hours as needed. For pain      ondansetron 4 MG tablet   Commonly known as: ZOFRAN   Take 1 tablet (4 mg total) by  mouth every 8 (eight) hours as needed for nausea.           Follow-up Information    Follow up with Jasmine German, MD.   Contact information:   [address] [place] [address] [phone]           The results of significant diagnostics from this hospitalization (including imaging, microbiology, ancillary and laboratory) are listed below for reference.    Significant Diagnostic Studies: Dg Chest 2 View  03/30/2012  *RADIOLOGY REPORT*  Clinical Data: Body aches and fever.  Vomiting.  CHEST - 2 VIEW  Comparison: None.  Findings: Trachea is midline.  Heart size normal.  Lungs are clear. No pleural fluid.  IMPRESSION: No acute findings.  Original Report Authenticated By: Jasmine Hudson, M.D.    Microbiology: Recent Results (from the past 240 hour(s))  CULTURE, BLOOD (ROUTINE X 2)     Status: Normal (Preliminary result)   Collection Time   03/30/12  1:00 PM      Component Value Range Status Comment   Specimen Description BLOOD RIGHT ARM   Final    Special Requests     Final    Value: BOTTLES DRAWN  AEROBIC AND ANAEROBIC 10CC AEROBIC 5CC ANAEROBIC   Culture  Setup Time 03/31/2012 02:32   Final    Culture     Final    Value:        BLOOD CULTURE RECEIVED NO GROWTH TO DATE CULTURE WILL BE HELD FOR 5 DAYS BEFORE ISSUING A FINAL NEGATIVE REPORT   Report Status PENDING   Incomplete   CULTURE, BLOOD (ROUTINE X 2)     Status: Normal (Preliminary result)   Collection Time   03/30/12 10:15 PM      Component Value Range Status Comment   Specimen Description BLOOD LEFT ARM   Final    Special Requests BOTTLES DRAWN AEROBIC AND ANAEROBIC 10CC EACH   Final    Culture  Setup Time 03/31/2012 02:32   Final    Culture     Final    Value:        BLOOD CULTURE RECEIVED NO GROWTH TO DATE CULTURE WILL BE HELD FOR 5 DAYS BEFORE ISSUING A FINAL NEGATIVE REPORT   Report Status PENDING   Incomplete      Labs: Basic Metabolic Panel:  Lab 03/31/12 1610 03/30/12 1243  NA 143  137  K 3.4* 3.3*  CL 110 101  CO2 25 26  GLUCOSE 88 105*  BUN 5* 7  CREATININE 0.72 0.79  CALCIUM 8.1* 9.0  MG 1.8 --  PHOS -- --   Liver Function Tests:  Lab 03/31/12 0606 03/30/12 1243  AST 22 20  ALT 14 17  ALKPHOS 91 80  BILITOT 0.4 0.4  PROT 6.0 7.0  ALBUMIN 2.4* 3.2*   No results found for this basename: LIPASE:5,AMYLASE:5 in the last 168 hours No results found for this basename: AMMONIA:5 in the last 168 hours CBC:  Lab 03/31/12 0606 03/30/12 1243  WBC 17.8* 17.4*  NEUTROABS -- 14.0*  HGB 10.7* 12.4  HCT 32.9* 37.4  MCV 85.5 83.9  PLT 183 207   Cardiac Enzymes: No results found for this basename: CKTOTAL:5,CKMB:5,CKMBINDEX:5,TROPONINI:5 in the last 168 hours BNP: BNP (last 3 results) No results found for this basename: PROBNP:3 in the last 8760 hours CBG: No results found for this basename: GLUCAP:5 in the last 168 hours  Time coordinating discharge: 30 minutes  Signed:  Suzann Hudson  [facility] 04/01/2012, 8:47 AM

## 2012-04-06 LAB — CULTURE, BLOOD (ROUTINE X 2): Culture: NO GROWTH

## 2012-05-28 ENCOUNTER — Encounter (HOSPITAL_COMMUNITY): Payer: Self-pay

## 2012-05-28 ENCOUNTER — Emergency Department (HOSPITAL_COMMUNITY)
Admission: EM | Admit: 2012-05-28 | Discharge: 2012-05-28 | Disposition: A | Discharge status: Home / Self Care | Payer: Medicaid Other | Attending: Emergency Medicine | Admitting: Emergency Medicine

## 2012-05-28 DIAGNOSIS — M545 Low back pain, unspecified: Secondary | ICD-10-CM | POA: Insufficient documentation

## 2012-05-28 DIAGNOSIS — R1013 Epigastric pain: Secondary | ICD-10-CM | POA: Insufficient documentation

## 2012-05-28 DIAGNOSIS — N390 Urinary tract infection, site not specified: Secondary | ICD-10-CM

## 2012-05-28 DIAGNOSIS — R1011 Right upper quadrant pain: Secondary | ICD-10-CM | POA: Insufficient documentation

## 2012-05-28 DIAGNOSIS — Z87891 Personal history of nicotine dependence: Secondary | ICD-10-CM | POA: Insufficient documentation

## 2012-05-28 DIAGNOSIS — R1110 Vomiting, unspecified: Secondary | ICD-10-CM | POA: Insufficient documentation

## 2012-05-28 LAB — URINALYSIS, ROUTINE W REFLEX MICROSCOPIC
Bilirubin Urine: NEGATIVE
Glucose, UA: NEGATIVE mg/dL
Glucose, UA: NEGATIVE mg/dL
Hgb urine dipstick: NEGATIVE
Ketones, ur: 15 mg/dL — AB
Protein, ur: NEGATIVE mg/dL
Protein, ur: NEGATIVE mg/dL
Urobilinogen, UA: 1 mg/dL (ref 0.0–1.0)
pH: 6 (ref 5.0–8.0)

## 2012-05-28 LAB — CBC WITH DIFFERENTIAL/PLATELET
Basophils Relative: 0 % (ref 0–1)
Eosinophils Absolute: 0.1 10*3/uL (ref 0.0–0.7)
Eosinophils Relative: 1 % (ref 0–5)
MCH: 29.4 pg (ref 26.0–34.0)
MCHC: 34.1 g/dL (ref 30.0–36.0)
Monocytes Absolute: 0.8 10*3/uL (ref 0.1–1.0)
Monocytes Relative: 6 % (ref 3–12)
Neutro Abs: 9.1 10*3/uL — ABNORMAL HIGH (ref 1.7–7.7)
RBC: 4.56 MIL/uL (ref 3.87–5.11)
WBC: 12.4 10*3/uL — ABNORMAL HIGH (ref 4.0–10.5)

## 2012-05-28 LAB — COMPREHENSIVE METABOLIC PANEL
Albumin: 3.6 g/dL (ref 3.5–5.2)
BUN: 7 mg/dL (ref 6–23)
Calcium: 9.3 mg/dL (ref 8.4–10.5)
Chloride: 100 mEq/L (ref 96–112)
Creatinine, Ser: 0.75 mg/dL (ref 0.50–1.10)
GFR calc non Af Amer: >90 mL/min (ref >90)
Total Bilirubin: 0.4 mg/dL (ref 0.3–1.2)

## 2012-05-28 LAB — URINE MICROSCOPIC-ADD ON

## 2012-05-28 LAB — POCT PREGNANCY, URINE: Preg Test, Ur: NEGATIVE

## 2012-05-28 MED ORDER — CEPHALEXIN 500 MG PO CAPS: 500.0000 mg | ORAL_CAPSULE | Freq: Four times a day (QID) | ORAL | Status: DC

## 2012-05-28 MED ORDER — CEPHALEXIN 250 MG PO CAPS: 500.0000 mg | ORAL_CAPSULE | Freq: Once | ORAL | Status: DC

## 2012-05-28 NOTE — ED Notes (Signed)
Seen here 2 months ago and had pyelonephritis, sts todayfeels the same.

## 2012-05-28 NOTE — ED Notes (Signed)
RN explained need for In and out cath. Pt. Verbalized understanding and refused procedure. Another Clean catch specimen obtained. MD notified.

## 2012-05-28 NOTE — ED Provider Notes (Signed)
History   This chart was scribed for Benny Lennert, MD by Charolett Bumpers . The patient was seen in room TR06C/TR06C. Patient's care was started at 1650.   CSN: [id number] Arrival date & time 05/28/12  1320  First MD Initiated Contact with Patient 05/28/12 1650      Chief Complaint  Patient presents with  . Pyelonephritis    HPI Comments: Jasmine Hudson is a 21 y.o. female who presents to the Emergency Department complaining of constant, moderate lower back pain and upper abdominal pain. She reports a h/o similar symptoms. She report associated increased urinary frequency, vomiting and a fever. Temperature here in ED is 98.3. She states that she has vomited twice today. She denies any dysuria. She reports the only abdominal surgery she has had is a c-section.    Patient is a 21 y.o. female presenting with back pain. The history is provided by the patient. No language interpreter was used.  Back Pain  This is a new problem. The current episode started yesterday. The problem occurs constantly. The problem has been gradually worsening. The pain is present in the lumbar spine. Associated symptoms include a fever and abdominal pain. Pertinent negatives include no chest pain, no headaches and no dysuria. She has tried nothing for the symptoms.    Past Medical History  Diagnosis Date  . No pertinent past medical history     Past Surgical History  Procedure Date  . Cesarean section   . Cesarean section 02/29/2012    Procedure: CESAREAN SECTION;  Surgeon: Tilda Burrow, MD;  Location: [facility];  Service: Gynecology;  Laterality: N/A;  Repeat     Family History  Problem Relation Age of Onset  . Hypertension Mother     History  Substance Use Topics  . Smoking status: Former Smoker    Types: Cigarettes  . Smokeless tobacco: Not on file  . Alcohol Use: No    OB History    Grav Para Term Preterm Abortions TAB SAB Ect Mult Living   2 2 2  0      2      Review of Systems    Constitutional: Positive for fever. Negative for fatigue.  HENT: Negative for congestion, sinus pressure and ear discharge.   Eyes: Negative for discharge.  Respiratory: Negative for cough.   Cardiovascular: Negative for chest pain.  Gastrointestinal: Positive for vomiting and abdominal pain. Negative for diarrhea.  Genitourinary: Positive for frequency. Negative for dysuria and hematuria.  Musculoskeletal: Positive for back pain.  Skin: Negative for rash.  Neurological: Negative for seizures and headaches.  Hematological: Negative.   Psychiatric/Behavioral: Negative for hallucinations.  All other systems reviewed and are negative.    Allergies  Review of patient's allergies indicates no known allergies.  Home Medications   Current Outpatient Rx  Name Route Sig Dispense Refill  . IBUPROFEN 200 MG PO TABS Oral Take 400 mg by mouth every 8 (eight) hours as needed. For pain      BP 124/67  Pulse 104  Temp 98.3 F (36.8 C) (Oral)  Resp 18  SpO2 98%  Physical Exam  Constitutional: She is oriented to person, place, and time. She appears well-developed.  HENT:  Head: Normocephalic and atraumatic.  Eyes: Conjunctivae normal and EOM are normal.  Neck: Normal range of motion. Neck supple.  Cardiovascular: Normal rate and regular rhythm.  Exam reveals no gallop and no friction rub.   No murmur heard. Pulmonary/Chest: Effort normal and breath sounds  normal.  Abdominal: Soft. She exhibits no distension. There is tenderness in the right upper quadrant and epigastric area. There is CVA tenderness. There is no rebound.       Mild right flank tenderness, RUQ tenderness and mid epigastric tenderness.   Musculoskeletal: Normal range of motion. She exhibits no edema.  Neurological: She is alert and oriented to person, place, and time.  Skin: Skin is warm and dry.  Psychiatric: She has a normal mood and affect. Her behavior is normal.    ED Course  Procedures (including critical care  time)  DIAGNOSTIC STUDIES: Oxygen Saturation is 98% on room air, normal by my interpretation.    COORDINATION OF CARE:  16:55-Informed pt of lab results. Discussed planned course of treatment with the patient including preforming a in and out cath to obtain a clean specimen, who is agreeable at this time.   18:40-Recheck: Informed pt of second UA results. A urine culture has been obtained. Will treat pt with Keflex.  18:45-Medication Orders: Cephalexin (Keflex) capsule 500 mg-once.    Results for orders placed during the hospital encounter of 05/28/12  CBC WITH DIFFERENTIAL      Component Value Range   WBC 12.4 (*) 4.0 - 10.5 K/uL   RBC 4.56  3.87 - 5.11 MIL/uL   Hemoglobin 13.4  12.0 - 15.0 g/dL   HCT 45.4  09.8 - 11.9 %   MCV 86.2  78.0 - 100.0 fL   MCH 29.4  26.0 - 34.0 pg   MCHC 34.1  30.0 - 36.0 g/dL   RDW 14.7  82.9 - 56.2 %   Platelets 315  150 - 400 K/uL   Neutrophils Relative 73  43 - 77 %   Neutro Abs 9.1 (*) 1.7 - 7.7 K/uL   Lymphocytes Relative 20  12 - 46 %   Lymphs Abs 2.4  0.7 - 4.0 K/uL   Monocytes Relative 6  3 - 12 %   Monocytes Absolute 0.8  0.1 - 1.0 K/uL   Eosinophils Relative 1  0 - 5 %   Eosinophils Absolute 0.1  0.0 - 0.7 K/uL   Basophils Relative 0  0 - 1 %   Basophils Absolute 0.0  0.0 - 0.1 K/uL  COMPREHENSIVE METABOLIC PANEL      Component Value Range   Sodium 136  135 - 145 mEq/L   Potassium 3.2 (*) 3.5 - 5.1 mEq/L   Chloride 100  96 - 112 mEq/L   CO2 27  19 - 32 mEq/L   Glucose, Bld 98  70 - 99 mg/dL   BUN 7  6 - 23 mg/dL   Creatinine, Ser 1.30  0.50 - 1.10 mg/dL   Calcium 9.3  8.4 - 86.5 mg/dL   Total Protein 7.7  6.0 - 8.3 g/dL   Albumin 3.6  3.5 - 5.2 g/dL   AST 19  0 - 37 U/L   ALT 16  0 - 35 U/L   Alkaline Phosphatase 74  39 - 117 U/L   Total Bilirubin 0.4  0.3 - 1.2 mg/dL   GFR calc non Af Amer >90  >90 mL/min   GFR calc Af Amer >90  >90 mL/min  URINALYSIS, ROUTINE W REFLEX MICROSCOPIC      Component Value Range   Color, Urine  AMBER (*) YELLOW   APPearance HAZY (*) CLEAR   Specific Gravity, Urine 1.026  1.005 - 1.030   pH 6.0  5.0 - 8.0   Glucose, UA NEGATIVE  NEGATIVE mg/dL   Hgb urine dipstick NEGATIVE  NEGATIVE   Bilirubin Urine NEGATIVE  NEGATIVE   Ketones, ur NEGATIVE  NEGATIVE mg/dL   Protein, ur NEGATIVE  NEGATIVE mg/dL   Urobilinogen, UA 1.0  0.0 - 1.0 mg/dL   Nitrite NEGATIVE  NEGATIVE   Leukocytes, UA MODERATE (*) NEGATIVE  POCT PREGNANCY, URINE      Component Value Range   Preg Test, Ur NEGATIVE  NEGATIVE  URINE MICROSCOPIC-ADD ON      Component Value Range   Squamous Epithelial / LPF MANY (*) RARE   WBC, UA 7-10  <3 WBC/hpf   RBC / HPF 0-2  <3 RBC/hpf   Bacteria, UA RARE  RARE   Urine-Other MUCOUS PRESENT    URINALYSIS, ROUTINE W REFLEX MICROSCOPIC      Component Value Range   Color, Urine AMBER (*) YELLOW   APPearance CLOUDY (*) CLEAR   Specific Gravity, Urine 1.034 (*) 1.005 - 1.030   pH 6.0  5.0 - 8.0   Glucose, UA NEGATIVE  NEGATIVE mg/dL   Hgb urine dipstick NEGATIVE  NEGATIVE   Bilirubin Urine NEGATIVE  NEGATIVE   Ketones, ur 15 (*) NEGATIVE mg/dL   Protein, ur NEGATIVE  NEGATIVE mg/dL   Urobilinogen, UA 4.0 (*) 0.0 - 1.0 mg/dL   Nitrite NEGATIVE  NEGATIVE   Leukocytes, UA MODERATE (*) NEGATIVE  URINE MICROSCOPIC-ADD ON      Component Value Range   Squamous Epithelial / LPF MANY (*) RARE   WBC, UA 11-20  <3 WBC/hpf   Bacteria, UA MANY (*) RARE   Urine-Other MUCOUS PRESENT       No diagnosis found.    MDM    The chart was scribed for me under my direct supervision.  I personally performed the history, physical, and medical decision making and all procedures in the evaluation of this patient.Benny Lennert, MD 06/03/12 1520

## 2012-05-28 NOTE — ED Notes (Signed)
Pt refused vitals at this time.

## 2012-05-29 LAB — URINE CULTURE: Colony Count: 4000

## 2012-09-25 ENCOUNTER — Emergency Department (HOSPITAL_COMMUNITY)
Admission: EM | Admit: 2012-09-25 | Discharge: 2012-09-25 | Disposition: A | Discharge status: Home / Self Care | Payer: Medicaid Other | Attending: Emergency Medicine | Admitting: Emergency Medicine

## 2012-09-25 ENCOUNTER — Emergency Department (HOSPITAL_COMMUNITY): Payer: Medicaid Other

## 2012-09-25 ENCOUNTER — Encounter (HOSPITAL_COMMUNITY): Payer: Self-pay | Admitting: *Deleted

## 2012-09-25 DIAGNOSIS — N12 Tubulo-interstitial nephritis, not specified as acute or chronic: Secondary | ICD-10-CM | POA: Insufficient documentation

## 2012-09-25 DIAGNOSIS — R110 Nausea: Secondary | ICD-10-CM | POA: Insufficient documentation

## 2012-09-25 DIAGNOSIS — R509 Fever, unspecified: Secondary | ICD-10-CM | POA: Insufficient documentation

## 2012-09-25 DIAGNOSIS — Z3202 Encounter for pregnancy test, result negative: Secondary | ICD-10-CM | POA: Insufficient documentation

## 2012-09-25 DIAGNOSIS — Z87891 Personal history of nicotine dependence: Secondary | ICD-10-CM | POA: Insufficient documentation

## 2012-09-25 LAB — CBC WITH DIFFERENTIAL/PLATELET
Basophils Absolute: 0 10*3/uL (ref 0.0–0.1)
Eosinophils Absolute: 0.1 10*3/uL (ref 0.0–0.7)
Eosinophils Relative: 1 % (ref 0–5)
HCT: 38.8 % (ref 36.0–46.0)
Lymphocytes Relative: 17 % (ref 12–46)
Lymphs Abs: 2.5 10*3/uL (ref 0.7–4.0)
MCH: 29.1 pg (ref 26.0–34.0)
MCV: 86.8 fL (ref 78.0–100.0)
Monocytes Absolute: 1.2 10*3/uL — ABNORMAL HIGH (ref 0.1–1.0)
Platelets: 284 10*3/uL (ref 150–400)
RDW: 14.1 % (ref 11.5–15.5)
WBC: 14.5 10*3/uL — ABNORMAL HIGH (ref 4.0–10.5)

## 2012-09-25 LAB — URINALYSIS, ROUTINE W REFLEX MICROSCOPIC
Nitrite: POSITIVE — AB
Protein, ur: 30 mg/dL — AB
Specific Gravity, Urine: 1.029 (ref 1.005–1.030)
Urobilinogen, UA: 1 mg/dL (ref 0.0–1.0)

## 2012-09-25 LAB — COMPREHENSIVE METABOLIC PANEL
CO2: 27 mEq/L (ref 19–32)
Calcium: 9.3 mg/dL (ref 8.4–10.5)
Creatinine, Ser: 0.69 mg/dL (ref 0.50–1.10)
GFR calc Af Amer: >90 mL/min (ref >90)
GFR calc non Af Amer: >90 mL/min (ref >90)
Glucose, Bld: 90 mg/dL (ref 70–99)
Sodium: 138 mEq/L (ref 135–145)
Total Protein: 7.8 g/dL (ref 6.0–8.3)

## 2012-09-25 LAB — URINE MICROSCOPIC-ADD ON

## 2012-09-25 LAB — PREGNANCY, URINE: Preg Test, Ur: NEGATIVE

## 2012-09-25 MED ORDER — KETOROLAC TROMETHAMINE 30 MG/ML IJ SOLN
30.0000 mg | Freq: Once | INTRAMUSCULAR | Status: AC
  Administered 2012-09-25: 30 mg via INTRAVENOUS
  Filled 2012-09-25: qty 1

## 2012-09-25 MED ORDER — SODIUM CHLORIDE 0.9 % IV BOLUS (SEPSIS)
1000.0000 mL | Freq: Once | INTRAVENOUS | Status: AC
  Administered 2012-09-25: 1000 mL via INTRAVENOUS

## 2012-09-25 MED ORDER — ONDANSETRON HCL 4 MG/2ML IJ SOLN
4.0000 mg | Freq: Once | INTRAMUSCULAR | Status: AC
  Administered 2012-09-25: 4 mg via INTRAVENOUS
  Filled 2012-09-25: qty 2

## 2012-09-25 MED ORDER — DEXTROSE 5 % IV SOLN
1.0000 g | Freq: Once | INTRAVENOUS | Status: AC
  Administered 2012-09-25: 1 g via INTRAVENOUS
  Filled 2012-09-25: qty 10

## 2012-09-25 MED ORDER — CEPHALEXIN 500 MG PO CAPS: 500.0000 mg | ORAL_CAPSULE | Freq: Four times a day (QID) | ORAL | Status: DC

## 2012-09-25 MED ORDER — POTASSIUM CHLORIDE CRYS ER 20 MEQ PO TBCR
40.0000 meq | EXTENDED_RELEASE_TABLET | Freq: Once | ORAL | Status: AC
  Administered 2012-09-25: 40 meq via ORAL
  Filled 2012-09-25: qty 2

## 2012-09-25 NOTE — ED Notes (Signed)
C/o RLQ pain & constipation x 3 days

## 2012-09-25 NOTE — ED Notes (Signed)
Reports last BM 1 week ago. Denies emesis, diarrhea, urinary frequency, dysuria. Increased pain with mvmt. States had a fever yesterday.

## 2012-09-25 NOTE — ED Provider Notes (Signed)
History     CSN: [id number]  Arrival date & time 09/25/12  1503   First MD Initiated Contact with Patient 09/25/12 1529      Chief Complaint  Patient presents with  . Abdominal Pain    (Consider location/radiation/quality/duration/timing/severity/associated sxs/prior treatment) The history is provided by the patient.  Jasmine Hudson is a 22 y.o. female here with right-sided abdominal pain and fevers. She noticed right flank pain for the last 3 days that is intermittent and is not getting worse. Not worse with food but worse with movement. She said that she felt constipated the last bowel movement was yesterday and was hard. She felt nauseous but did not vomit and no urinary symptoms. Also has some fever 101 earlier today but denies any coughing. Denies being pregnant .    Past Medical History  Diagnosis Date  . No pertinent past medical history     Past Surgical History  Procedure Laterality Date  . Cesarean section    . Cesarean section  02/29/2012    Procedure: CESAREAN SECTION;  Surgeon: Tilda Burrow, MD;  Location: [facility];  Service: Gynecology;  Laterality: N/A;  Repeat     Family History  Problem Relation Age of Onset  . Hypertension Mother     History  Substance Use Topics  . Smoking status: Former Smoker    Types: Cigarettes  . Smokeless tobacco: Not on file  . Alcohol Use: No    OB History   Grav Para Term Preterm Abortions TAB SAB Ect Mult Living   2 2 2  0      2      Review of Systems  Gastrointestinal: Positive for nausea and abdominal pain.  All other systems reviewed and are negative.    Allergies  Review of patient's allergies indicates no known allergies.  Home Medications   Current Outpatient Rx  Name  Route  Sig  Dispense  Refill  . bismuth subsalicylate (PEPTO BISMOL) 262 MG/15ML suspension   Oral   Take 30 mLs by mouth once as needed for indigestion. Took only once           BP 128/64  Pulse 113  Temp(Src) 99.7 F (37.6  C) (Oral)  Resp 18  SpO2 99%  LMP 09/11/2012  Breastfeeding? No  Physical Exam  Nursing note and vitals reviewed. Constitutional: She is oriented to person, place, and time. She appears well-developed and well-nourished.  Anxious   HENT:  Head: Normocephalic.  Mouth/Throat: Oropharynx is clear and moist.  Eyes: Conjunctivae are normal. Pupils are equal, round, and reactive to light.  Neck: Normal range of motion. Neck supple.  Cardiovascular: Normal rate, regular rhythm and normal heart sounds.   Pulmonary/Chest: Effort normal and breath sounds normal. No respiratory distress. She has no wheezes. She has no rales.  Abdominal: Soft.  + mild R CVAT. No mcBurney's point tenderness. Diffuse tenderness in RUQ, epigastrium, and L sided. No rebound.   Musculoskeletal: Normal range of motion.  Neurological: She is alert and oriented to person, place, and time.  Skin: Skin is warm and dry.  Psychiatric: She has a normal mood and affect. Her behavior is normal. Judgment and thought content normal.    ED Course  Procedures (including critical care time)  Labs Reviewed  CBC WITH DIFFERENTIAL - Abnormal; Notable for the following:    WBC 14.5 (*)    Neutro Abs 10.7 (*)    Monocytes Absolute 1.2 (*)    All other components within  normal limits  COMPREHENSIVE METABOLIC PANEL - Abnormal; Notable for the following:    Potassium 3.3 (*)    All other components within normal limits  URINALYSIS, ROUTINE W REFLEX MICROSCOPIC - Abnormal; Notable for the following:    APPearance HAZY (*)    Hgb urine dipstick MODERATE (*)    Ketones, ur 15 (*)    Protein, ur 30 (*)    Nitrite POSITIVE (*)    Leukocytes, UA MODERATE (*)    All other components within normal limits  URINE MICROSCOPIC-ADD ON - Abnormal; Notable for the following:    Squamous Epithelial / LPF MANY (*)    Bacteria, UA MANY (*)    All other components within normal limits  URINE CULTURE  LIPASE, BLOOD  PREGNANCY, URINE   Dg  Abd Acute W/chest  09/25/2012  *RADIOLOGY REPORT*  Clinical Data: Abdominal pain  ACUTE ABDOMEN SERIES (ABDOMEN 2 VIEW & CHEST 1 VIEW)  Comparison: 03/30/2012  Findings: The heart size and mediastinal contours are within normal limits.  Both lungs are clear.  The visualized skeletal structures are unremarkable.  The bowel gas pattern appears nonobstructed.  No dilated loop of small bowel or fluid level noted.  Gas and stool identified within the colon up to the rectum.  IMPRESSION: Negative exam.   Original Report Authenticated By: Signa Kell, M.D.      No diagnosis found.    MDM  Jasmine Hudson is a 22 y.o. female here with constipation, ab pain, fever. Will need to r/o pyelonephritis vs constipation. Not concerned for appendicitis right now given nonfocal exam. Will give pain meds and reassess.   5:54 PM Abdomen soft, mild R CVAT. UA + UTI. Likely complicated UTI vs pyelo. CBC showed WBC 14. Not toxic appearing. Ab xray showed mild constipation. She was given ceftriaxone here. HR dec from 113 to 98 on discharge. Will d/c home on keflex.          Richardean Canal, MD 09/25/12 Windy Fast

## 2012-09-27 LAB — URINE CULTURE: Colony Count: >=100000

## 2012-09-28 ENCOUNTER — Telehealth (HOSPITAL_COMMUNITY): Payer: Self-pay | Admitting: Emergency Medicine

## 2012-09-28 NOTE — ED Notes (Signed)
Patient has +Urine culture. Checking to see if appropriately treated. °

## 2012-09-28 NOTE — ED Notes (Signed)
+  Urine. Patient treated with Keflex. Sensitive to same. Per protocol MD. °

## 2013-03-19 ENCOUNTER — Inpatient Hospital Stay (HOSPITAL_COMMUNITY)
Admission: AD | Admit: 2013-03-19 | Discharge: 2013-03-20 | Disposition: A | Discharge status: Home / Self Care | Payer: Medicaid Other | Source: Ambulatory Visit | Attending: Obstetrics & Gynecology | Admitting: Obstetrics & Gynecology

## 2013-03-19 ENCOUNTER — Encounter (HOSPITAL_COMMUNITY): Payer: Self-pay | Admitting: Medical

## 2013-03-19 DIAGNOSIS — O219 Vomiting of pregnancy, unspecified: Secondary | ICD-10-CM

## 2013-03-19 DIAGNOSIS — R109 Unspecified abdominal pain: Secondary | ICD-10-CM | POA: Insufficient documentation

## 2013-03-19 DIAGNOSIS — O210 Mild hyperemesis gravidarum: Secondary | ICD-10-CM | POA: Insufficient documentation

## 2013-03-19 LAB — URINALYSIS, ROUTINE W REFLEX MICROSCOPIC
Hgb urine dipstick: NEGATIVE
Ketones, ur: >80 mg/dL — AB
Protein, ur: NEGATIVE mg/dL
Specific Gravity, Urine: >1.03 — ABNORMAL HIGH (ref 1.005–1.030)
Urobilinogen, UA: 1 mg/dL (ref 0.0–1.0)

## 2013-03-19 LAB — URINE MICROSCOPIC-ADD ON

## 2013-03-19 MED ORDER — PROMETHAZINE HCL 25 MG/ML IJ SOLN
25.0000 mg | Freq: Once | INTRAVENOUS | Status: AC
  Administered 2013-03-19: 25 mg via INTRAVENOUS
  Filled 2013-03-19: qty 1

## 2013-03-19 NOTE — MAU Note (Signed)
Hasn't been able to eat, hasn't tried in 3 days.  Excruciating pain when vomits in upper abd.  Waiting on preg medicaid

## 2013-03-20 DIAGNOSIS — O210 Mild hyperemesis gravidarum: Secondary | ICD-10-CM

## 2013-03-20 LAB — URINE CULTURE

## 2013-03-20 MED ORDER — PROMETHAZINE HCL 25 MG PO TABS: 25.0000 mg | ORAL_TABLET | Freq: Four times a day (QID) | ORAL | Status: DC | PRN

## 2013-03-20 NOTE — MAU Provider Note (Signed)
Chart reviewed and agree with management and plan.  

## 2013-03-20 NOTE — MAU Provider Note (Signed)
Chief Complaint: Morning Sickness   First Provider Initiated Contact with Patient 03/19/13 2255     SUBJECTIVE HPI: Jasmine Hudson is a 22 y.o. G3P2002 at [redacted]w[redacted]d by LMP who presents with N/V of pregnancy, worse x three days. Hasn't tried anything for it. Has generalized abd pain while vomiting, none otherwise. Feels weak tonight. No fever, chills, diarrhea, constipation, sick contacts, urinary complaints, Vaginal bleeding or vaginal discharge.   Past Medical History  Diagnosis Date  . No pertinent past medical history    OB History   Grav Para Term Preterm Abortions TAB SAB Ect Mult Living   3 2 2  0      2     # Outc Date GA Lbr Len/2nd Wgt Sex Del Anes PTL Lv   1 TRM 3/07 [redacted]w[redacted]d  3.912kg(8lb10oz) F LTCS   Yes   2 TRM 7/13 [redacted]w[redacted]d 00:00 3.59kg(7lb14.6oz) M LTCS Spinal  Yes   3 CUR              Past Surgical History  Procedure Laterality Date  . Cesarean section    . Cesarean section  02/29/2012    Procedure: CESAREAN SECTION;  Surgeon: Tilda Burrow, MD;  Location: [facility];  Service: Gynecology;  Laterality: N/A;  Repeat    History   Social History  . Marital Status: Single    Spouse Name: N/A    Number of Children: N/A  . Years of Education: N/A   Occupational History  . Not on file.   Social History Main Topics  . Smoking status: Former Smoker    Types: Cigarettes  . Smokeless tobacco: Not on file  . Alcohol Use: No  . Drug Use: No  . Sexually Active: Yes    Birth Control/ Protection: None   Other Topics Concern  . Not on file   Social History Narrative  . No narrative on file   No current facility-administered medications on file prior to encounter.   No current outpatient prescriptions on file prior to encounter.   No Known Allergies  ROS: Pertinent items in HPI  OBJECTIVE Blood pressure 115/60, pulse 81, temperature 98.1 F (36.7 C), temperature source Oral, resp. rate 18, weight 103.874 kg (229 lb), last menstrual period 01/20/2013, SpO2  100.00%. GENERAL: Well-developed, well-nourished female in no acute distress.  HEENT: Normocephalic.  Mucus membranes moist.  HEART: normal rate RESP: normal effort ABDOMEN: Soft, non-tender EXTREMITIES: Nontender, no edema NEURO: Alert and oriented SPECULUM EXAM: Deferred  LAB RESULTS Results for orders placed during the hospital encounter of 03/19/13 (from the past 24 hour(s))  URINALYSIS, ROUTINE W REFLEX MICROSCOPIC     Status: Abnormal   Collection Time    03/19/13  6:05 PM      Result Value Range   Color, Urine YELLOW  YELLOW   APPearance CLEAR  CLEAR   Specific Gravity, Urine >1.030 (*) 1.005 - 1.030   pH 6.0  5.0 - 8.0   Glucose, UA NEGATIVE  NEGATIVE mg/dL   Hgb urine dipstick NEGATIVE  NEGATIVE   Bilirubin Urine SMALL (*) NEGATIVE   Ketones, ur >80 (*) NEGATIVE mg/dL   Protein, ur NEGATIVE  NEGATIVE mg/dL   Urobilinogen, UA 1.0  0.0 - 1.0 mg/dL   Nitrite NEGATIVE  NEGATIVE   Leukocytes, UA TRACE (*) NEGATIVE  URINE MICROSCOPIC-ADD ON     Status: Abnormal   Collection Time    03/19/13  6:05 PM      Result Value Range   Squamous Epithelial /  LPF MANY (*) RARE   WBC, UA 3-6  <3 WBC/hpf   Bacteria, UA MANY (*) RARE   Urine-Other MUCOUS PRESENT    POCT PREGNANCY, URINE     Status: Abnormal   Collection Time    03/19/13  6:29 PM      Result Value Range   Preg Test, Ur POSITIVE (*) NEGATIVE    IMAGING No results found.  MAU COURSE Feeling much better after phenergan in D5LR. Tolerating POs. Weakness resolved.   ASSESSMENT 1. Nausea and vomiting in pregnancy prior to [redacted] weeks gestation     PLAN Discharge home. Advance diet slowly. Urine culture pending. Specimen possibly contaminated.       Follow-up Information   Follow up with [facility].      Follow up with [facility] [place] [facility]. (As needed if symptoms worsen)    Contact information:   [address] [id number] [place]  [address] [phone]       Medication List    STOP taking these medications       bismuth subsalicylate 262 MG/15ML suspension  Commonly known as:  PEPTO BISMOL     cephALEXin 500 MG capsule  Commonly known as:  KEFLEX      TAKE these medications       promethazine 25 MG tablet  Commonly known as:  PHENERGAN  Take 1 tablet (25 mg total) by mouth every 6 (six) hours as needed for nausea.         [place], CNM 03/20/2013  1:41 AM

## 2013-04-27 IMAGING — US US OB COMP LESS 14 WK
1 series · 14 of 19 positions shown · non-contrast
Comparison: none

[Series 1: us ob comp less 14 wks · 14 of 19 slices shown]
[im 1/19]
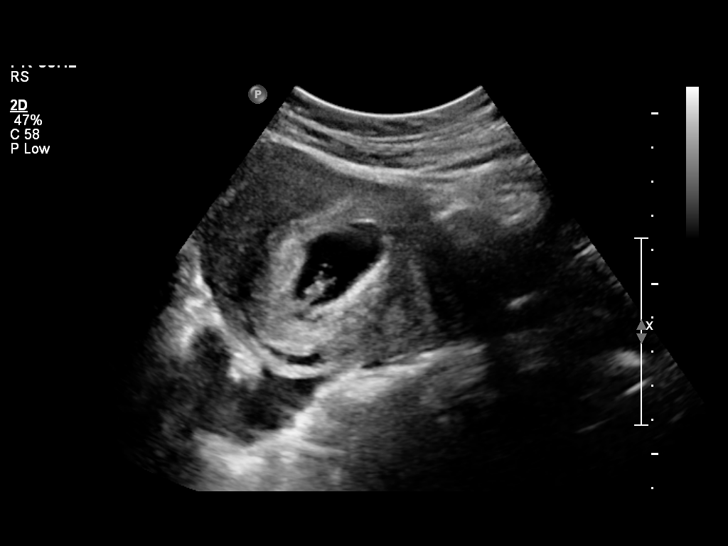
[im 3/19]
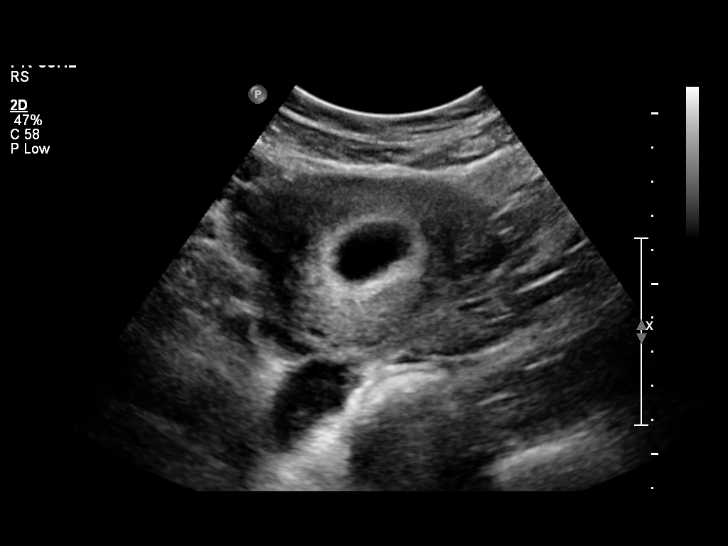
[im 4/19]
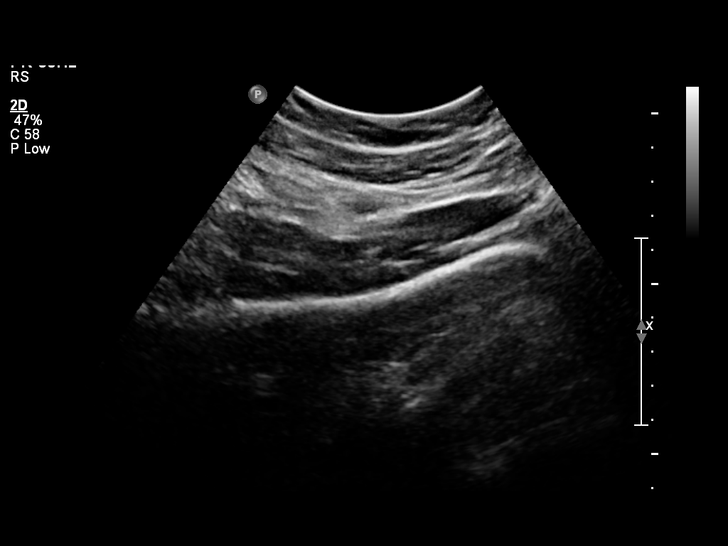
[im 5/19]
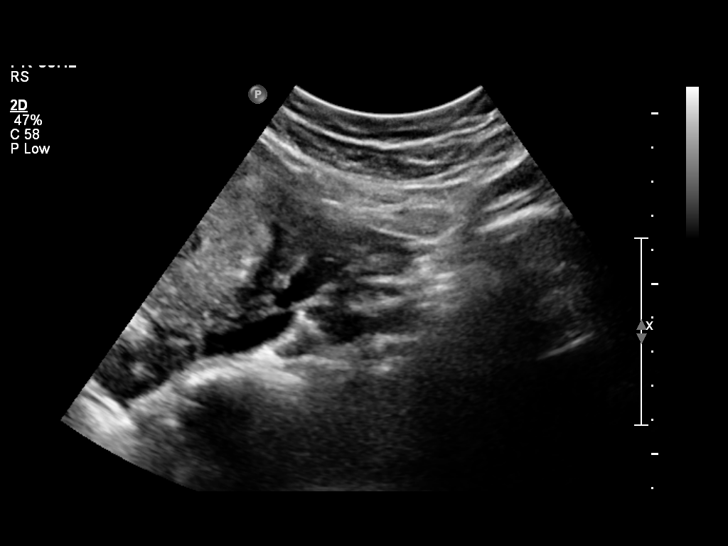
[im 7/19]
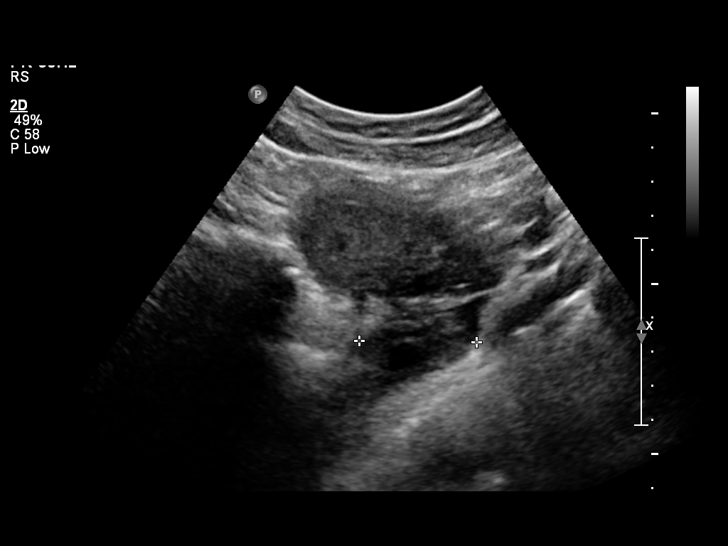
[im 8/19]
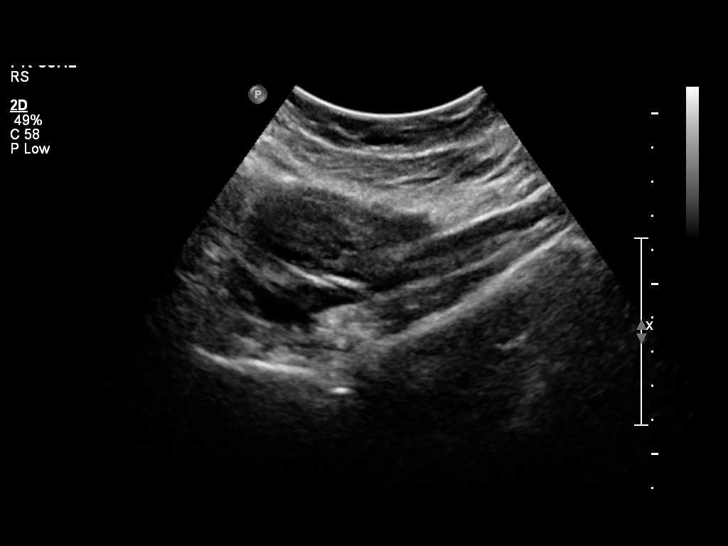
[im 9/19]
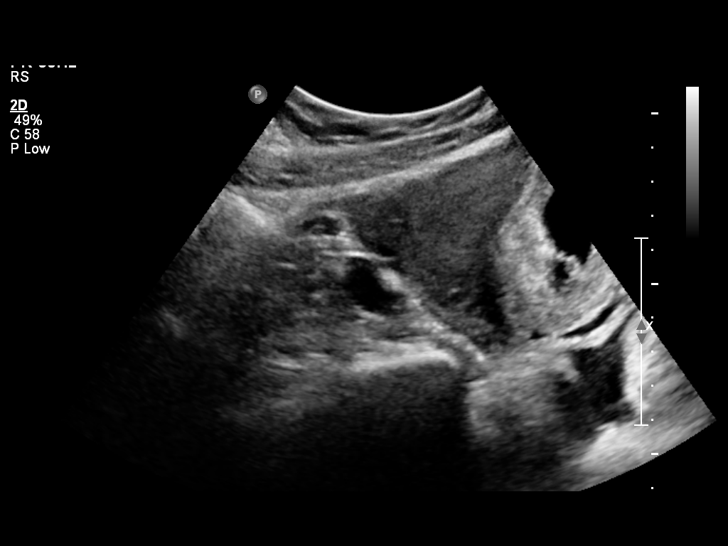
[im 11/19]
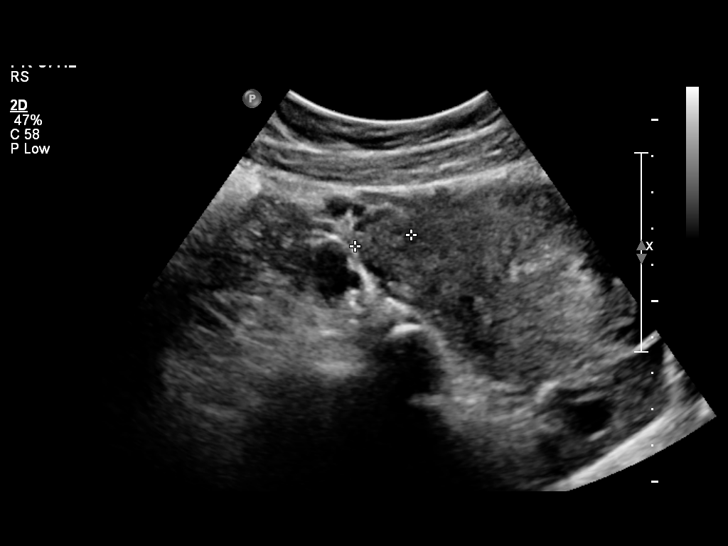
[im 12/19]
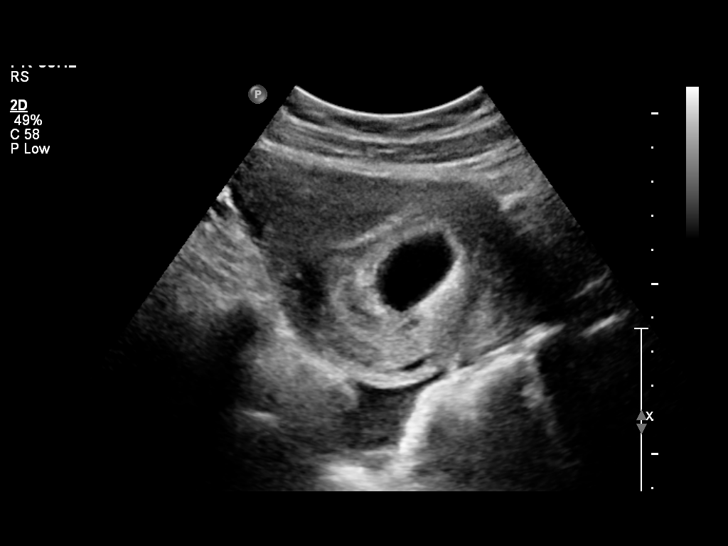
[im 13/19]
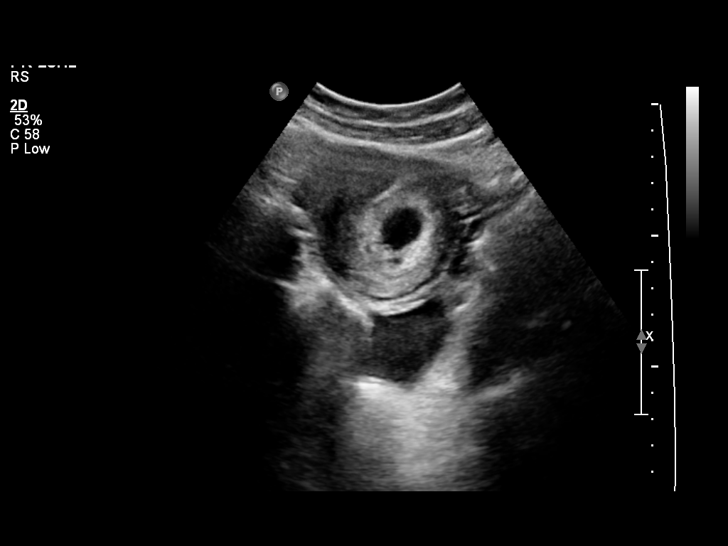
[im 15/19]
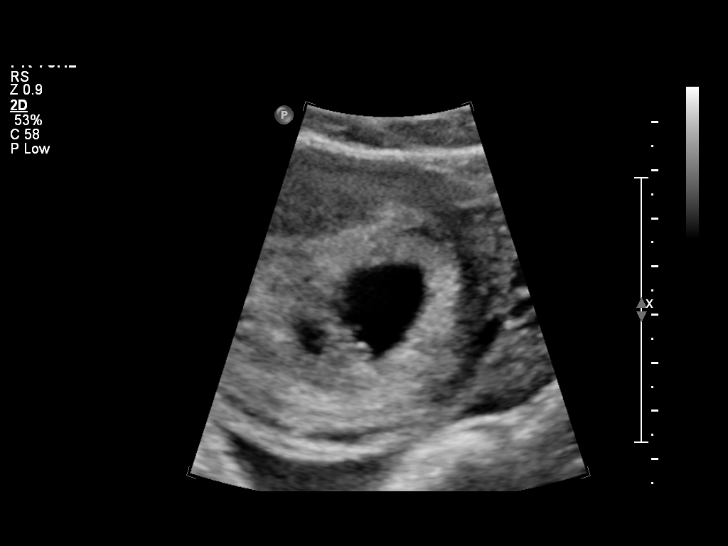
[im 16/19]
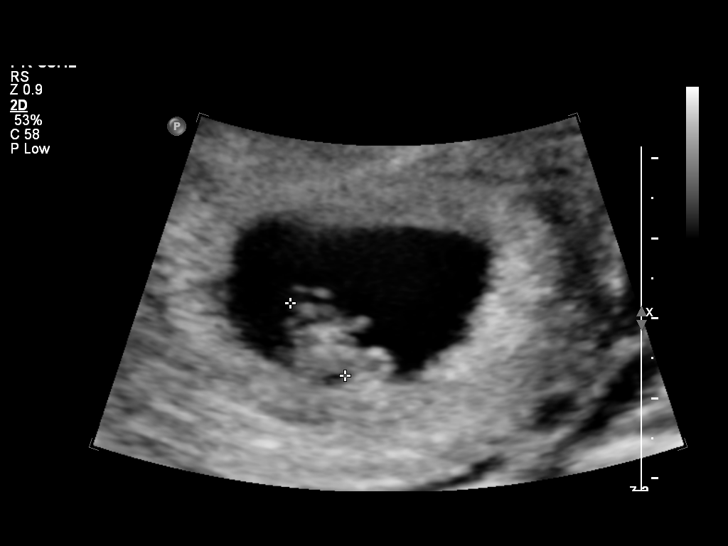
[im 17/19]
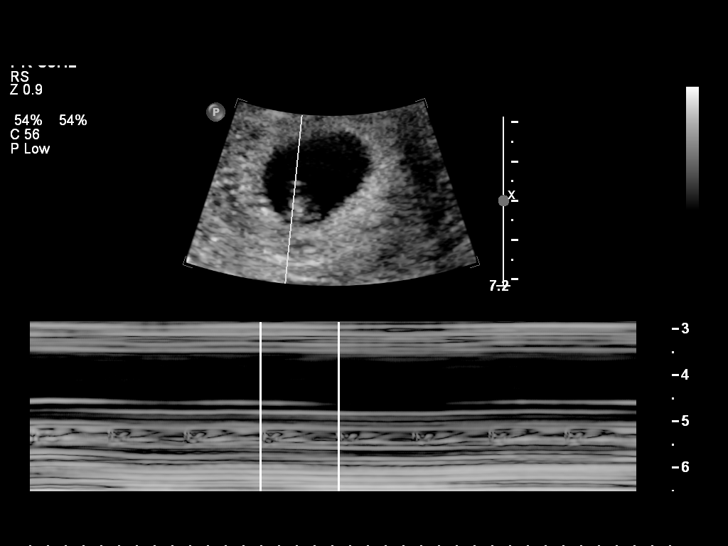
[im 19/19]
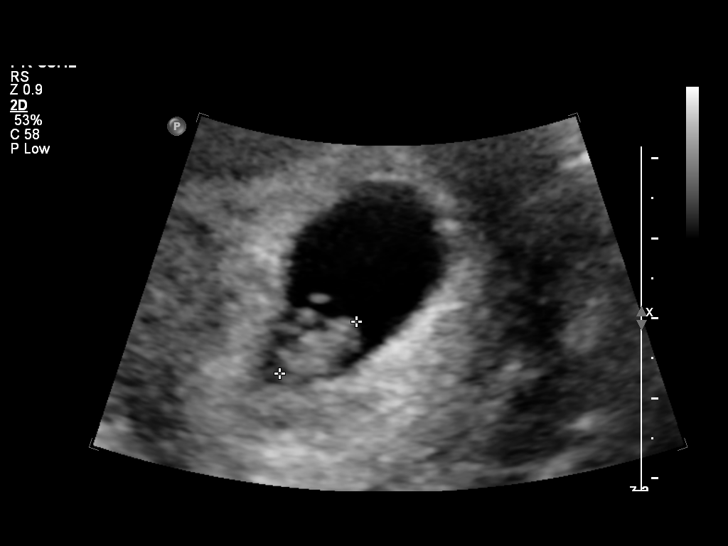

[14 of 19 positions shown; findings below may reference images not displayed]

OBSTETRICS REPORT
                      (Signed Final 07/14/2011 [DATE])

                 CNM
Procedures

 US OB COMP LESS 14 WKS                                76801.0
Indications

 Pain - Abdominal/Pelvic
Fetal Evaluation

 Gest. Sac:         Intrauterine
 Yolk Sac:          Visualized
 Fetal Pole:        Visualized
 Fetal Heart Rate:  136                          bpm
 Cardiac Activity:  Observed
Biometry

 CRL:     11.5  mm     G. Age:  7w 2d                   EDD:   02/28/12
Gestational Age

 LMP:           6w 6d         Date:  05/27/11                 EDD:   03/02/12
 Best:          6w 6d      Det. By:  LMP  (05/27/11)          EDD:   03/02/12
Cervix Uterus Adnexa

 Cervix:       Closed.
 Cul De Sac:   Small amount of free fluid seen.

 Left Ovary:    Small corpus luteum noted.
 Right Ovary:   Within normal limits.
Impression

  Single living intrauterine gestation with CRL indicating an
 EGA of  7w4d.  This is concordant with the LMP.  Limited
 anatomic survey due to early gestational age.  Full anatomy
 scan at 18-21 weeks recommended.

 questions or concerns.

## 2013-07-07 ENCOUNTER — Encounter: Payer: Self-pay | Admitting: Advanced Practice Midwife

## 2013-07-07 ENCOUNTER — Encounter: Payer: Medicaid Other | Admitting: Advanced Practice Midwife

## 2013-08-20 ENCOUNTER — Inpatient Hospital Stay (HOSPITAL_COMMUNITY)
Admission: AD | Admit: 2013-08-20 | Discharge: 2013-08-20 | Disposition: A | Discharge status: Home / Self Care | Payer: Medicaid Other | Source: Ambulatory Visit | Attending: Obstetrics & Gynecology | Admitting: Obstetrics & Gynecology

## 2013-08-20 ENCOUNTER — Encounter (HOSPITAL_COMMUNITY): Payer: Self-pay | Admitting: *Deleted

## 2013-08-20 DIAGNOSIS — O479 False labor, unspecified: Secondary | ICD-10-CM

## 2013-08-20 DIAGNOSIS — O0930 Supervision of pregnancy with insufficient antenatal care, unspecified trimester: Secondary | ICD-10-CM | POA: Insufficient documentation

## 2013-08-20 DIAGNOSIS — Z87891 Personal history of nicotine dependence: Secondary | ICD-10-CM | POA: Insufficient documentation

## 2013-08-20 DIAGNOSIS — Z3483 Encounter for supervision of other normal pregnancy, third trimester: Secondary | ICD-10-CM

## 2013-08-20 DIAGNOSIS — O4700 False labor before 37 completed weeks of gestation, unspecified trimester: Secondary | ICD-10-CM | POA: Insufficient documentation

## 2013-08-20 LAB — URINALYSIS, ROUTINE W REFLEX MICROSCOPIC
Bilirubin Urine: NEGATIVE
GLUCOSE, UA: NEGATIVE mg/dL
Hgb urine dipstick: NEGATIVE
Ketones, ur: 15 mg/dL — AB
Nitrite: NEGATIVE
PH: 6 (ref 5.0–8.0)
PROTEIN: NEGATIVE mg/dL
SPECIFIC GRAVITY, URINE: 1.025 (ref 1.005–1.030)
Urobilinogen, UA: 0.2 mg/dL (ref 0.0–1.0)

## 2013-08-20 LAB — URINE MICROSCOPIC-ADD ON

## 2013-08-20 MED ORDER — PRENATAL VITAMINS 0.8 MG PO TABS: 1.0000 | ORAL_TABLET | Freq: Every day | ORAL | Status: DC

## 2013-08-20 NOTE — MAU Provider Note (Signed)
First Provider Initiated Contact with Patient 08/20/13 2224      Chief Complaint:  Contractions   Jasmine Hudson is  23 y.o. G3P2002 at 8515w2d presents complaining of Contractions and cramping all day.  She has not had any PNC (was going to TAB, then was in denial).  [facility], and has no plans for adoption.   Obstetrical/Gynecological History: OB History   Grav Para Term Preterm Abortions TAB SAB Ect Mult Living   3 2 2  0      2     Past Medical History: Past Medical History  Diagnosis Date  . No pertinent past medical history     Past Surgical History: Past Surgical History  Procedure Laterality Date  . Cesarean section    . Cesarean section  02/29/2012    Procedure: CESAREAN SECTION;  Surgeon: Tilda BurrowJohn V Ferguson, MD;  Location: [facility];  Service: Gynecology;  Laterality: N/A;  Repeat     Family History: Family History  Problem Relation Age of Onset  . Hypertension Mother     Social History: History  Substance Use Topics  . Smoking status: Former Smoker    Types: Cigarettes  . Smokeless tobacco: Not on file  . Alcohol Use: No    Allergies: No Known Allergies  Meds:  No prescriptions prior to admission    Review of Systems   Constitutional: Negative for fever and chills Eyes: Negative for visual disturbances Respiratory: Negative for shortness of breath, dyspnea Cardiovascular: Negative for chest pain or palpitations  Gastrointestinal: Negative for vomiting, diarrhea and constipation Genitourinary: Negative for dysuria and urgency Musculoskeletal: Negative for back pain, joint pain, myalgias  Neurological: Negative for dizziness and headaches    Physical Exam  Blood pressure 135/77, pulse 86, temperature 97.9 F (36.6 C), temperature source Oral, resp. rate 20, height 5\' 7"  (1.702 m), weight 112.946 kg (249 lb), last menstrual period 01/20/2013, SpO2 100.00%. GENERAL: Well-developed, well-nourished female in no acute distress.  LUNGS:  Clear to auscultation bilaterally.  HEART: Regular rate and rhythm. ABDOMEN: Soft, nontender, nondistended, gravid.  EXTREMITIES: Nontender, no edema, 2+ distal pulses. DTR's 2+ CERVICAL EXAM: Dilatation 0cm   Effacement 0%   Station ballottable   Presentation: cephalic FHT:  Baseline rate 140 bpm   Variability moderate  Accelerations present   Decelerations none Contractions: Every 0 mins.  No irritability  Labs: Results for orders placed during the hospital encounter of 08/20/13 (from the past 24 hour(s))  URINALYSIS, ROUTINE W REFLEX MICROSCOPIC   Collection Time    08/20/13  9:47 PM      Result Value Range   Color, Urine YELLOW  YELLOW   APPearance CLEAR  CLEAR   Specific Gravity, Urine 1.025  1.005 - 1.030   pH 6.0  5.0 - 8.0   Glucose, UA NEGATIVE  NEGATIVE mg/dL   Hgb urine dipstick NEGATIVE  NEGATIVE   Bilirubin Urine NEGATIVE  NEGATIVE   Ketones, ur 15 (*) NEGATIVE mg/dL   Protein, ur NEGATIVE  NEGATIVE mg/dL   Urobilinogen, UA 0.2  0.0 - 1.0 mg/dL   Nitrite NEGATIVE  NEGATIVE   Leukocytes, UA SMALL (*) NEGATIVE  URINE MICROSCOPIC-ADD ON   Collection Time    08/20/13  9:47 PM      Result Value Range   Squamous Epithelial / LPF FEW (*) RARE   WBC, UA 3-6  <3 WBC/hpf   Bacteria, UA FEW (*) RARE   Urine-Other MUCOUS PRESENT     Imaging  Studies:  No results found.  Assessment: Jasmine Hudson is  23 y.o. G3P2002 at [redacted]w[redacted]d presents with Jasmine Hudson, most likely 2/2 inadequate PO fluid intatke.  Plan: Hydrate self;  Order for outpt u/s sent.  Pt strongly encouraged to call clinic to schedule appt  CRESENZO-DISHMAN,Tahj Njoku 1/8/201510:34 PM

## 2013-08-20 NOTE — MAU Note (Signed)
Pt reports "really bad" contractions all day. Back pain.

## 2013-08-20 NOTE — MAU Provider Note (Signed)
Attestation of Attending Supervision of Advanced Practitioner (CNM/NP): Evaluation and management procedures were performed by the Advanced Practitioner under my supervision and collaboration.  I have reviewed the Advanced Practitioner's note and chart, and I agree with the management and plan.  HARRAWAY-SMITH, Syniyah Bourne 10:46 PM

## 2013-08-20 NOTE — Discharge Instructions (Signed)

## 2013-08-22 ENCOUNTER — Other Ambulatory Visit: Payer: Self-pay | Admitting: Obstetrics & Gynecology

## 2013-08-22 DIAGNOSIS — O2343 Unspecified infection of urinary tract in pregnancy, third trimester: Secondary | ICD-10-CM

## 2013-08-22 MED ORDER — CEPHALEXIN 500 MG PO CAPS: 500.0000 mg | ORAL_CAPSULE | Freq: Four times a day (QID) | ORAL | Status: DC

## 2013-08-23 LAB — URINE CULTURE: Colony Count: >=100000

## 2013-08-24 ENCOUNTER — Telehealth: Payer: Self-pay | Admitting: *Deleted

## 2013-08-24 ENCOUNTER — Encounter: Payer: Self-pay | Admitting: *Deleted

## 2013-08-24 NOTE — Telephone Encounter (Signed)
Called patient and went straight to message that stated the subscriber is not available. Will send letter to patient.

## 2013-08-24 NOTE — Telephone Encounter (Signed)
Message copied by Mannie StabileASH, Colena Ketterman A on Mon Aug 24, 2013  8:45 AM ------      Message from: Willodean RosenthalHARRAWAY-SMITH, CAROLYN      Created: Sat Aug 22, 2013 10:26 AM       Please call pt. She has a UTI. Keflex ordered in EPIC,            Thx,      clh-S ------

## 2013-09-10 ENCOUNTER — Other Ambulatory Visit (HOSPITAL_COMMUNITY)
Admission: RE | Admit: 2013-09-10 | Discharge: 2013-09-10 | Disposition: A | Discharge status: Home / Self Care | Payer: Medicaid Other | Source: Ambulatory Visit | Attending: Family | Admitting: Family

## 2013-09-10 ENCOUNTER — Ambulatory Visit (INDEPENDENT_AMBULATORY_CARE_PROVIDER_SITE_OTHER): Payer: Medicaid Other | Admitting: Family

## 2013-09-10 ENCOUNTER — Encounter: Payer: Self-pay | Admitting: Family

## 2013-09-10 VITALS — BP 124/81 | Temp 97.8°F | Wt 250.6 lb

## 2013-09-10 DIAGNOSIS — Z113 Encounter for screening for infections with a predominantly sexual mode of transmission: Secondary | ICD-10-CM | POA: Insufficient documentation

## 2013-09-10 DIAGNOSIS — O34219 Maternal care for unspecified type scar from previous cesarean delivery: Secondary | ICD-10-CM

## 2013-09-10 DIAGNOSIS — O0930 Supervision of pregnancy with insufficient antenatal care, unspecified trimester: Secondary | ICD-10-CM

## 2013-09-10 DIAGNOSIS — Z98891 History of uterine scar from previous surgery: Secondary | ICD-10-CM | POA: Insufficient documentation

## 2013-09-10 DIAGNOSIS — Z01419 Encounter for gynecological examination (general) (routine) without abnormal findings: Secondary | ICD-10-CM | POA: Insufficient documentation

## 2013-09-10 LAB — POCT URINALYSIS DIP (DEVICE)
BILIRUBIN URINE: NEGATIVE
GLUCOSE, UA: NEGATIVE mg/dL
Hgb urine dipstick: NEGATIVE
Ketones, ur: NEGATIVE mg/dL
NITRITE: NEGATIVE
PH: 6 (ref 5.0–8.0)
Protein, ur: NEGATIVE mg/dL
Specific Gravity, Urine: 1.025 (ref 1.005–1.030)
Urobilinogen, UA: 0.2 mg/dL (ref 0.0–1.0)

## 2013-09-10 LAB — OB RESULTS CONSOLE HIV ANTIBODY (ROUTINE TESTING): HIV: NONREACTIVE

## 2013-09-10 LAB — HIV ANTIBODY (ROUTINE TESTING W REFLEX): HIV: NONREACTIVE

## 2013-09-10 LAB — OB RESULTS CONSOLE RPR: RPR: NONREACTIVE

## 2013-09-10 LAB — GLUCOSE TOLERANCE, 1 HOUR (50G) W/O FASTING: GLUCOSE 1 HOUR GTT: 89 mg/dL (ref 70–140)

## 2013-09-10 NOTE — Progress Notes (Signed)
U/S scheduled 09/11/13 at 1 pm.

## 2013-09-10 NOTE — Progress Notes (Signed)
Pulse- 98  Pain-"at incision site from prior c-section x 2" Weight gain 25-35lbs

## 2013-09-10 NOTE — Progress Notes (Signed)
Subjective:    Jasmine EnsignKaalea T Hudson is a [id number] 13110w2d being seen today for her first obstetrical visit.  Her obstetrical history is significant for previous csection x 2.  Pt desires TOLAC.  Marland Kitchen. Patient does intend to breast feed. Pregnancy history fully reviewed.  Pt late to care due to being undecided regarding continuing pregnancy and possible adoption.  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  Plans to have child for 6 months and boyfriends sister will keep child until she completes school.  Desires Mirena for family planning.   Patient reports no bleeding, no leaking and +lower pelvic pain.Ceasar Mons.  Filed Vitals:   09/10/13 0907  BP: 124/81  Temp: 97.8 F (36.6 C)  Weight: 250 lb 9.6 oz (113.671 kg)    HISTORY: OB History  Gravida Para Term Preterm AB SAB TAB Ectopic Multiple Living  3 2 2  0      2    # Outcome Date GA Lbr Len/2nd Weight Sex Delivery Anes PTL Lv  3 CUR           2 TRM 02/29/12 1358w5d  7 lb 14.6 oz (3.59 kg) M LTCS Spinal  Y     Comments: Elected repeat  1 TRM 11/09/05 624w0d  8 lb 10 oz (3.912 kg) F LTCS   Y     Comments: Failure to progress     Past Medical History  Diagnosis Date  . No pertinent past medical history   . Herpes    Past Surgical History  Procedure Laterality Date  . Cesarean section    . Cesarean section  02/29/2012    Procedure: CESAREAN SECTION;  Surgeon: Tilda BurrowJohn V Ferguson, MD;  Location: [facility];  Service: Gynecology;  Laterality: N/A;  Repeat    Family History  Problem Relation Age of Onset  . Hypertension Mother      Exam   Filed Vitals:   09/10/13 0907  BP: 124/81  Temp: 97.8 F (36.6 C)   Exam   BP 124/81  Temp(Src) 97.8 F (36.6 C)  Wt 250 lb 9.6 oz (113.671 kg)  LMP 01/20/2013 Uterine Size: Fundal height 34  Pelvic Exam:    Perineum: No Hemorrhoids, Normal Perineum   Vulva: normal   Vagina:  normal mucosa, normal discharge, no palpable nodules   pH: Not done   Cervix: no bleeding following Pap, no cervical motion tenderness and no lesions   Adnexa: normal adnexa and no mass, fullness, tenderness   Bony Pelvis: Adequate  System: Breast:  No nipple retraction or dimpling, No nipple discharge or bleeding, No axillary or supraclavicular adenopathy, Normal to palpation without dominant masses   Skin: normal coloration and turgor, no rashes    Neurologic: negative   Extremities: normal strength, tone, and muscle mass   HEENT neck supple with midline trachea and thyroid without masses   Mouth/Teeth mucous membranes moist, pharynx normal without lesions   Neck supple and no masses   Cardiovascular: regular rate and rhythm, no murmurs or gallops   Respiratory:  appears well, vitals normal, no respiratory distress, acyanotic, normal RR, neck free of mass or lymphadenopathy, chest clear, no wheezing, crepitations, rhonchi, normal symmetric air entry   Abdomen: soft, non-tender; bowel sounds normal; no masses,  no organomegaly   Urinary: urethral meatus normal      Assessment:    Pregnancy: [id number] Patient Active Problem List   Diagnosis Date Noted  . Late prenatal care complicating  pregnancy 09/10/2013  . H/O: C-section 09/10/2013  . Herpes 11/22/2011    UTI    Plan:     Initial labs drawn.  Pap smear completed. 1 hr glucola today.  Pt informed RX for Keflex at pharmacy for UTI.   Prenatal vitamins. Problem list reviewed and updated. Genetic Screening:  Too late  Ultrasound discussed; fetal survey: ordered.  Follow up in 2 weeks.  [facility] 09/10/2013

## 2013-09-11 ENCOUNTER — Ambulatory Visit (HOSPITAL_COMMUNITY): Payer: Medicaid Other

## 2013-09-11 ENCOUNTER — Ambulatory Visit (HOSPITAL_COMMUNITY)
Admission: RE | Admit: 2013-09-11 | Discharge: 2013-09-11 | Disposition: A | Discharge status: Home / Self Care | Payer: Medicaid Other | Source: Ambulatory Visit | Attending: Family | Admitting: Family

## 2013-09-11 ENCOUNTER — Encounter: Payer: Self-pay | Admitting: Family

## 2013-09-11 DIAGNOSIS — O0930 Supervision of pregnancy with insufficient antenatal care, unspecified trimester: Secondary | ICD-10-CM | POA: Insufficient documentation

## 2013-09-11 DIAGNOSIS — O34219 Maternal care for unspecified type scar from previous cesarean delivery: Secondary | ICD-10-CM | POA: Insufficient documentation

## 2013-09-11 LAB — PRESCRIPTION MONITORING PROFILE (19 PANEL)
Amphetamine/Meth: NEGATIVE ng/mL
Barbiturate Screen, Urine: NEGATIVE ng/mL
Benzodiazepine Screen, Urine: NEGATIVE ng/mL
Buprenorphine, Urine: NEGATIVE ng/mL
CARISOPRODOL, URINE: NEGATIVE ng/mL
CREATININE, URINE: 131.13 mg/dL (ref >20.0)
Cannabinoid Scrn, Ur: NEGATIVE ng/mL
Cocaine Metabolites: NEGATIVE ng/mL
ECSTASY: NEGATIVE ng/mL
Fentanyl, Ur: NEGATIVE ng/mL
MEPERIDINE UR: NEGATIVE ng/mL
METHADONE SCREEN, URINE: NEGATIVE ng/mL
METHAQUALONE SCREEN (URINE): NEGATIVE ng/mL
NITRITES URINE, INITIAL: NEGATIVE ug/mL
Opiate Screen, Urine: NEGATIVE ng/mL
Oxycodone Screen, Ur: NEGATIVE ng/mL
PH URINE, INITIAL: 6.1 pH (ref 4.5–8.9)
PROPOXYPHENE: NEGATIVE ng/mL
Phencyclidine, Ur: NEGATIVE ng/mL
TRAMADOL UR: NEGATIVE ng/mL
Tapentadol, urine: NEGATIVE ng/mL
Zolpidem, Urine: NEGATIVE ng/mL

## 2013-09-12 LAB — OBSTETRIC PANEL
Antibody Screen: NEGATIVE
BASOS ABS: 0 10*3/uL (ref 0.0–0.1)
BASOS PCT: 0 % (ref 0–1)
Eosinophils Absolute: 0.2 10*3/uL (ref 0.0–0.7)
Eosinophils Relative: 3 % (ref 0–5)
HCT: 34.3 % — ABNORMAL LOW (ref 36.0–46.0)
HEMOGLOBIN: 11.9 g/dL — AB (ref 12.0–15.0)
HEP B S AG: NEGATIVE
Lymphocytes Relative: 23 % (ref 12–46)
Lymphs Abs: 2 10*3/uL (ref 0.7–4.0)
MCH: 27.9 pg (ref 26.0–34.0)
MCHC: 34.7 g/dL (ref 30.0–36.0)
MCV: 80.3 fL (ref 78.0–100.0)
MONOS PCT: 7 % (ref 3–12)
Monocytes Absolute: 0.6 10*3/uL (ref 0.1–1.0)
NEUTROS ABS: 5.9 10*3/uL (ref 1.7–7.7)
NEUTROS PCT: 67 % (ref 43–77)
Platelets: 276 10*3/uL (ref 150–400)
RBC: 4.27 MIL/uL (ref 3.87–5.11)
RDW: 15.4 % (ref 11.5–15.5)
Rh Type: POSITIVE
Rubella: 1.08 Index — ABNORMAL HIGH (ref <0.90)
WBC: 8.7 10*3/uL (ref 4.0–10.5)

## 2013-09-12 LAB — OB RESULTS CONSOLE RPR: RPR: NONREACTIVE

## 2013-09-14 ENCOUNTER — Ambulatory Visit (HOSPITAL_COMMUNITY): Payer: Medicaid Other

## 2013-09-15 LAB — CULTURE, OB URINE

## 2013-09-18 ENCOUNTER — Other Ambulatory Visit: Payer: Self-pay | Admitting: Family

## 2013-09-18 ENCOUNTER — Encounter: Payer: Self-pay | Admitting: Family

## 2013-09-18 DIAGNOSIS — A599 Trichomoniasis, unspecified: Secondary | ICD-10-CM | POA: Insufficient documentation

## 2013-09-18 HISTORY — DX: Trichomoniasis, unspecified: A59.9

## 2013-09-18 MED ORDER — CEPHALEXIN 500 MG PO CAPS: 500.0000 mg | ORAL_CAPSULE | Freq: Four times a day (QID) | ORAL | Status: DC

## 2013-09-18 MED ORDER — METRONIDAZOLE 500 MG PO TABS: ORAL_TABLET | ORAL | Status: DC

## 2013-09-18 NOTE — Progress Notes (Unsigned)
Unable to notify patient regarding positive trichomoniasis due to disconnected telephone number.  Message routed to staff to advise pt regarding both UTI and trich and that RX were sent to pharmacy.  [id number] - [place], [place] - [address]

## 2013-09-19 ENCOUNTER — Encounter: Payer: Self-pay | Admitting: Family

## 2013-09-24 ENCOUNTER — Telehealth: Payer: Self-pay | Admitting: Obstetrics and Gynecology

## 2013-09-24 ENCOUNTER — Encounter: Payer: Self-pay | Admitting: Obstetrics and Gynecology

## 2013-09-24 ENCOUNTER — Encounter: Payer: Medicaid Other | Admitting: Obstetrics and Gynecology

## 2013-09-24 NOTE — Telephone Encounter (Signed)
Called patient about missed appointment, but her phone is off. Will send certified letter.

## 2013-09-25 ENCOUNTER — Encounter: Payer: Self-pay | Admitting: *Deleted

## 2013-09-30 ENCOUNTER — Encounter: Payer: Self-pay | Admitting: *Deleted

## 2013-10-05 ENCOUNTER — Encounter: Payer: Self-pay | Admitting: *Deleted

## 2013-10-14 ENCOUNTER — Ambulatory Visit (INDEPENDENT_AMBULATORY_CARE_PROVIDER_SITE_OTHER): Payer: Medicaid Other | Admitting: Obstetrics and Gynecology

## 2013-10-14 VITALS — BP 126/76 | Temp 98.6°F | Wt 262.8 lb

## 2013-10-14 DIAGNOSIS — O0930 Supervision of pregnancy with insufficient antenatal care, unspecified trimester: Secondary | ICD-10-CM

## 2013-10-14 DIAGNOSIS — Z1389 Encounter for screening for other disorder: Secondary | ICD-10-CM

## 2013-10-14 DIAGNOSIS — Z3689 Encounter for other specified antenatal screening: Secondary | ICD-10-CM

## 2013-10-14 LAB — POCT URINALYSIS DIP (DEVICE)
Bilirubin Urine: NEGATIVE
Glucose, UA: NEGATIVE mg/dL
KETONES UR: NEGATIVE mg/dL
NITRITE: NEGATIVE
PROTEIN: NEGATIVE mg/dL
Specific Gravity, Urine: 1.02 (ref 1.005–1.030)
Urobilinogen, UA: 0.2 mg/dL (ref 0.0–1.0)
pH: 5.5 (ref 5.0–8.0)

## 2013-10-14 NOTE — Progress Notes (Signed)
S: [age] G3P2002 with history of LTCS x2 presents for routine OB visit today. Last week she was sitting down and felt a gush of fluid which soaked pants (she did not have underwear on at the time. The fluid was clear with no odors and she is confident that it was not her urine. She has had no repeat episodes. She reports good fetal movements. She has some vaginal discharge: white and runny with no odors. No vaginal bleeding. Feels contractions inconsistently. She rates them at 8 and describes them as a crampy feeling like she is on her period. She requests an induction at 39 weeks.  Patient has a history of urine positive for trichomonas and E. Coli. Patient has not started treatment yet but states she will pick up prescriptions today.

## 2013-10-14 NOTE — Progress Notes (Signed)
Informal US for presentation= vertex, subjectively normal AFV.  Pt counseled at length by Dr. Debroah LoopArnold and Dr. Reola CalkinsBeck regarding risks of VBAC after 2 C/S deliveries vs repeat C/S.  Pt decided on repeat C/S, VBAC form signed.  Pt states she is in the process of moving and requested C/S to be performed on 3/14 (which would be 39.4 wks)  After consulting with Dr. Debroah LoopArnold, I explained to pt that she is at higher risk of labor beginning or other complications with each Connar Keating after 39wks. The recommendation is to have C/S at 39 wks.  Pt voiced understanding of information given. Pt agrees to have C/S on 3/12 (39.2 wks).  Message sent to [place] for scheduling and pt will be contacted with all pertinent information.

## 2013-10-14 NOTE — Progress Notes (Signed)
P=110 Pt. States "I leaked some fluid last week. There was a pick circle of fluid in my pants and no odor." Denies any blood, bleeding or any more leakage of fluid and states baby is moving good, irregular contractions.  Pt. Never picked up Keflex for UTI or flagyl for +trich on pap. Informed pt. We had been unable to reach her by phone so we had sent letters; pt. Stated she never checks her mail. I explained the importance of picking both prescriptions up and treating infection. Pt. Verbalized understanding.

## 2013-10-14 NOTE — Progress Notes (Signed)
Did not get treated fro trichomonas or E.Coli ASB> will fill rx for Keflex and Flagyl. SS: bubbly discharge, cx thick/closed/-3. Brief US by Diane: cephalic, normal AFV Asking for TOLAC though had ERCS last pregnancy. Risks reviewed, accepts 4-5% chance uterine rupture; explained IOL at 39 wks not indicated> will have Dr. Debroah LoopArnold see pt.

## 2013-10-15 ENCOUNTER — Encounter: Payer: Self-pay | Admitting: *Deleted

## 2013-10-15 LAB — GC/CHLAMYDIA PROBE AMP
CT PROBE, AMP APTIMA: NEGATIVE
GC Probe RNA: NEGATIVE

## 2013-10-17 LAB — CULTURE, BETA STREP (GROUP B ONLY)

## 2013-10-20 ENCOUNTER — Encounter (HOSPITAL_COMMUNITY): Payer: Self-pay | Admitting: Pharmacist

## 2013-10-21 ENCOUNTER — Encounter (HOSPITAL_COMMUNITY): Payer: Self-pay

## 2013-10-21 ENCOUNTER — Encounter (HOSPITAL_COMMUNITY)
Admission: RE | Admit: 2013-10-21 | Discharge: 2013-10-21 | Disposition: A | Discharge status: Home / Self Care | Payer: Medicaid Other | Source: Ambulatory Visit | Attending: Obstetrics & Gynecology | Admitting: Obstetrics & Gynecology

## 2013-10-21 VITALS — BP 115/78 | HR 100 | Resp 20 | Ht 67.0 in | Wt 267.0 lb

## 2013-10-21 DIAGNOSIS — O0930 Supervision of pregnancy with insufficient antenatal care, unspecified trimester: Secondary | ICD-10-CM

## 2013-10-21 DIAGNOSIS — Z98891 History of uterine scar from previous surgery: Secondary | ICD-10-CM

## 2013-10-21 DIAGNOSIS — A599 Trichomoniasis, unspecified: Secondary | ICD-10-CM

## 2013-10-21 LAB — CBC
HEMATOCRIT: 33.8 % — AB (ref 36.0–46.0)
Hemoglobin: 11.4 g/dL — ABNORMAL LOW (ref 12.0–15.0)
MCH: 27.2 pg (ref 26.0–34.0)
MCHC: 33.7 g/dL (ref 30.0–36.0)
MCV: 80.7 fL (ref 78.0–100.0)
Platelets: 253 10*3/uL (ref 150–400)
RBC: 4.19 MIL/uL (ref 3.87–5.11)
RDW: 15.4 % (ref 11.5–15.5)
WBC: 10.9 10*3/uL — AB (ref 4.0–10.5)

## 2013-10-21 LAB — RPR: RPR Ser Ql: NONREACTIVE

## 2013-10-21 MED ORDER — CEFOTETAN DISODIUM 2 G IJ SOLR
2.0000 g | INTRAMUSCULAR | Status: DC
  Filled 2013-10-21: qty 2

## 2013-10-21 NOTE — Patient Instructions (Addendum)
   Your procedure is scheduled on: Wednesday, Mar 12  [facility] at[facility]  12 Noon Pick up the phone at the desk and dial [phone] and inform us of your arrival.  Please call this number if you have any problems the morning of surgery: [phone]  Remember: Do not eat food after midnight: Tuesday Do not drink clear liquids after:  930 Am Wednesday, day of surgery Take these medicines the morning of surgery with a SIP OF WATER:  None  Do not wear jewelry, make-up, or FINGER nail polish No metal in your hair or on your body. Do not wear lotions, powders, perfumes.  You may wear deodorant.  Do not bring valuables to the hospital. Contacts, dentures or bridgework may not be worn into surgery.  Leave suitcase in the car. After Surgery it may be brought to your room. For patients being admitted to the hospital, checkout time is 11:00am the day of discharge.  Home with mother Maureen RalphsVivian cell [phone].

## 2013-10-22 ENCOUNTER — Inpatient Hospital Stay (HOSPITAL_COMMUNITY)
Admission: RE | Admit: 2013-10-22 | Discharge: 2013-10-22 | Disposition: A | Discharge status: Home / Self Care | Payer: Medicaid Other | Source: Ambulatory Visit | Attending: Obstetrics & Gynecology | Admitting: Obstetrics & Gynecology

## 2013-10-22 ENCOUNTER — Encounter (HOSPITAL_COMMUNITY): Payer: Self-pay | Admitting: *Deleted

## 2013-10-22 DIAGNOSIS — Z98891 History of uterine scar from previous surgery: Secondary | ICD-10-CM

## 2013-10-22 DIAGNOSIS — A6000 Herpesviral infection of urogenital system, unspecified: Secondary | ICD-10-CM | POA: Insufficient documentation

## 2013-10-22 DIAGNOSIS — N390 Urinary tract infection, site not specified: Secondary | ICD-10-CM | POA: Insufficient documentation

## 2013-10-22 DIAGNOSIS — B3731 Acute candidiasis of vulva and vagina: Secondary | ICD-10-CM

## 2013-10-22 DIAGNOSIS — O34219 Maternal care for unspecified type scar from previous cesarean delivery: Secondary | ICD-10-CM | POA: Insufficient documentation

## 2013-10-22 DIAGNOSIS — Z87891 Personal history of nicotine dependence: Secondary | ICD-10-CM | POA: Insufficient documentation

## 2013-10-22 DIAGNOSIS — A5901 Trichomonal vulvovaginitis: Secondary | ICD-10-CM | POA: Insufficient documentation

## 2013-10-22 DIAGNOSIS — O479 False labor, unspecified: Secondary | ICD-10-CM

## 2013-10-22 DIAGNOSIS — O2390 Unspecified genitourinary tract infection in pregnancy, unspecified trimester: Secondary | ICD-10-CM | POA: Insufficient documentation

## 2013-10-22 DIAGNOSIS — Z9889 Other specified postprocedural states: Secondary | ICD-10-CM

## 2013-10-22 DIAGNOSIS — O98819 Other maternal infectious and parasitic diseases complicating pregnancy, unspecified trimester: Secondary | ICD-10-CM | POA: Insufficient documentation

## 2013-10-22 DIAGNOSIS — O98519 Other viral diseases complicating pregnancy, unspecified trimester: Secondary | ICD-10-CM | POA: Insufficient documentation

## 2013-10-22 DIAGNOSIS — O0930 Supervision of pregnancy with insufficient antenatal care, unspecified trimester: Secondary | ICD-10-CM

## 2013-10-22 DIAGNOSIS — B373 Candidiasis of vulva and vagina: Secondary | ICD-10-CM

## 2013-10-22 LAB — AMNISURE RUPTURE OF MEMBRANE (ROM) NOT AT ARMC: Amnisure ROM: NEGATIVE

## 2013-10-22 LAB — WET PREP, GENITAL
Clue Cells Wet Prep HPF POC: NONE SEEN
TRICH WET PREP: NONE SEEN

## 2013-10-22 LAB — PREPARE RBC (CROSSMATCH)

## 2013-10-22 MED ORDER — LACTATED RINGERS IV SOLN: Freq: Once | INTRAVENOUS | Status: DC

## 2013-10-22 MED ORDER — MORPHINE SULFATE 0.5 MG/ML IJ SOLN
INTRAMUSCULAR | Status: AC
  Filled 2013-10-22: qty 10

## 2013-10-22 MED ORDER — ONDANSETRON HCL 4 MG/2ML IJ SOLN
INTRAMUSCULAR | Status: AC
  Filled 2013-10-22: qty 2

## 2013-10-22 MED ORDER — FENTANYL CITRATE 0.05 MG/ML IJ SOLN
INTRAMUSCULAR | Status: AC
  Filled 2013-10-22: qty 2

## 2013-10-22 MED ORDER — LACTATED RINGERS IV SOLN: INTRAVENOUS | Status: DC

## 2013-10-22 MED ORDER — FLUCONAZOLE 150 MG PO TABS
150.0000 mg | ORAL_TABLET | Freq: Once | ORAL | Status: AC
  Administered 2013-10-22: 150 mg via ORAL
  Filled 2013-10-22: qty 1

## 2013-10-22 MED ORDER — SCOPOLAMINE 1 MG/3DAYS TD PT72: 1.0000 | MEDICATED_PATCH | Freq: Once | TRANSDERMAL | Status: DC

## 2013-10-22 MED ORDER — OXYTOCIN 10 UNIT/ML IJ SOLN
INTRAMUSCULAR | Status: AC
  Filled 2013-10-22: qty 4

## 2013-10-22 NOTE — Discharge Instructions (Signed)

## 2013-10-22 NOTE — H&P (Signed)
Preoperative History and Physical  Jasmine Hudson is a 23 y.o. G3P2002 at 5067w2d here for scheduled repeat cesarean section.  Patient reports having irregular contractions and possible leaking of clear fluid.  She reported passing her mucus plug yesterday.  No bleeding. Good FM.  No other concerns.  Of note, patient did inform Dr, Brayton CavesFreeman Jackson that she ate a french fry around 10 am this morning.  As a result, her scheduled procedure a will not be able to be done until after 6 pm today or later.  Patient is very upset about this development.   Past Medical History  Diagnosis Date  . Herpes   . UTI in pregnancy 10/2013    currently on antibotics   Past Surgical History  Procedure Laterality Date  . Cesarean section  2007  . Cesarean section  02/29/2012    Procedure: CESAREAN SECTION;  Surgeon: Tilda BurrowJohn V Ferguson, MD;  Location: [facility];  Service: Gynecology;  Laterality: N/A;  Repeat    OB History   Grav Para Term Preterm Abortions TAB SAB Ect Mult Living   3 2 2  0      2     Patient denies any cervical dysplasia or STIs. Prescriptions prior to admission  Medication Sig Dispense Refill  . cephALEXin (KEFLEX) 500 MG capsule Take 1 capsule (500 mg total) by mouth 4 (four) times daily.  28 capsule  2  . metroNIDAZOLE (FLAGYL) 500 MG tablet Take two tablets by mouth twice a day, for one day.  Or you can take all four tablets at once if you can tolerate it.  4 tablet  0    No Known Allergies Social History:   reports that she quit smoking about 3 years ago. Her smoking use included Cigars. She has never used smokeless tobacco. She reports that she does not drink alcohol or use illicit drugs. Family History  Problem Relation Age of Onset  . Hypertension Mother     Review of Systems: Noncontributory  PHYSICAL EXAM: Blood pressure 113/89, pulse 114, temperature 98.2 F (36.8 C), temperature source Oral, resp. rate 20, last menstrual period 01/20/2013, SpO2 100.00%. General appearance -  alert, well appearing, and in no distress Chest - clear to auscultation, no wheezes, rales or rhonchi, symmetric air entry Heart - normal rate and regular rhythm Abdomen - soft, nontender, nondistended,gravid, well-healed previous cesarean scars Pelvic - examination not indicated Extremities - peripheral pulses normal, no pedal edema, no clubbing or cyanosis  Labs: Results for orders placed during the hospital encounter of 10/22/13 (from the past 336 hour(s))  PREPARE RBC (CROSSMATCH)   Collection Time    10/22/13 12:00 PM      Result Value Ref Range   Order Confirmation ORDER PROCESSED BY BLOOD BANK    Results for orders placed during the hospital encounter of 10/21/13 (from the past 336 hour(s))  CBC   Collection Time    10/21/13 10:45 AM      Result Value Ref Range   WBC 10.9 (*) 4.0 - 10.5 K/uL   RBC 4.19  3.87 - 5.11 MIL/uL   Hemoglobin 11.4 (*) 12.0 - 15.0 g/dL   HCT 60.433.8 (*) 54.036.0 - 98.146.0 %   MCV 80.7  78.0 - 100.0 fL   MCH 27.2  26.0 - 34.0 pg   MCHC 33.7  30.0 - 36.0 g/dL   RDW 19.115.4  47.811.5 - 29.515.5 %   Platelets 253  150 - 400 K/uL  RPR   Collection Time  10/21/13 10:45 AM      Result Value Ref Range   RPR NON REACTIVE  NON REACTIVE  TYPE AND SCREEN   Collection Time    10/21/13 10:45 AM      Result Value Ref Range   ABO/RH(D) A POS     Antibody Screen NEG     Sample Expiration 10/24/2013     Unit Number [id number]     Blood Component Type RED CELLS,LR     Unit division 00     Status of Unit ALLOCATED     Transfusion Status OK TO TRANSFUSE     Crossmatch Result Compatible     Unit Number [id number]     Blood Component Type RED CELLS,LR     Unit division 00     Status of Unit ALLOCATED     Transfusion Status OK TO TRANSFUSE     Crossmatch Result Compatible    Results for orders placed in visit on 10/14/13 (from the past 336 hour(s))  POCT URINALYSIS DIP (DEVICE)   Collection Time    10/14/13  9:26 AM      Result Value Ref Range   Glucose, UA  NEGATIVE  NEGATIVE mg/dL   Bilirubin Urine NEGATIVE  NEGATIVE   Ketones, ur NEGATIVE  NEGATIVE mg/dL   Specific Gravity, Urine 1.020  1.005 - 1.030   Hgb urine dipstick TRACE (*) NEGATIVE   pH 5.5  5.0 - 8.0   Protein, ur NEGATIVE  NEGATIVE mg/dL   Urobilinogen, UA 0.2  0.0 - 1.0 mg/dL   Nitrite NEGATIVE  NEGATIVE   Leukocytes, UA SMALL (*) NEGATIVE  GC/CHLAMYDIA PROBE AMP   Collection Time    10/14/13  2:26 PM      Result Value Ref Range   CT Probe RNA NEGATIVE     GC Probe RNA NEGATIVE    CULTURE, BETA STREP (GROUP B ONLY)   Collection Time    10/14/13  2:26 PM      Result Value Ref Range   Organism ID, Bacteria NO GROUP B STREP (S.AGALACTIAE) ISOLATED      Assessment: Patient Active Problem List   Diagnosis Date Noted  . Trichomoniasis 09/18/2013  . Late prenatal care complicating pregnancy 09/10/2013  . H/O: C-section 09/10/2013  . Herpes 11/22/2011    Plan: Patient initially decided that she wants to undergo VBAC instead; risks of VBAC discussed including increased risk of uterine rupture that can result in internal hemorrhage, hysterectomy, neonatal death or neurological injury.complications.   She changed her mind and wants to reschedule repeat CS; this was rescheduled tomorrow 10/23/13 with Dr. Shawnie Pons at 10 am.  Patient told to be NPO after midnight, come to hospital at 8:30 am.    Patient then reported she wanted to be evaluated for possible ROM and labor.  She was sent to MAU for further evaluation. She and her mother desire the Amnisure test, this will be done in MAU.  MAU staff notified.  NPO until after MAU evaluation.    Jaynie Collins, M.D. 10/22/2013 1:39 PM

## 2013-10-22 NOTE — Anesthesia Preprocedure Evaluation (Signed)
Anesthesia Evaluation  Patient identified by MRN, date of birth, ID band Patient awake    Reviewed: Allergy & Precautions, H&P , NPO status , Patient's Chart, lab work & pertinent test results  Airway Mallampati: III      Dental   Pulmonary former smoker,  breath sounds clear to auscultation        Cardiovascular Exercise Tolerance: Good Rhythm:regular Rate:Normal     Neuro/Psych    GI/Hepatic   Endo/Other  Morbid obesity  Renal/GU      Musculoskeletal   Abdominal   Peds  Hematology   Anesthesia Other Findings   Reproductive/Obstetrics (+) Pregnancy                          Anesthesia Physical Anesthesia Plan  ASA: III  Anesthesia Plan: Spinal   Post-op Pain Management:    Induction:   Airway Management Planned:   Additional Equipment:   Intra-op Plan:   Post-operative Plan:   Informed Consent: I have reviewed the patients History and Physical, chart, labs and discussed the procedure including the risks, benefits and alternatives for the proposed anesthesia with the patient or authorized representative who has indicated his/her understanding and acceptance.     Plan Discussed with: Anesthesiologist, CRNA and Surgeon  Anesthesia Plan Comments:         Anesthesia Quick Evaluation  

## 2013-10-22 NOTE — MAU Note (Signed)
Patient states she was scheduled for a repeat cesarean section today at 1200 but had a french fry on the way to the hospital. Was sent from Day Surgery for evaluation Patient states she has some leaking about 3 weeks ago and has continued to have a little leaking off and on. Denies contractions at this time. Reports good fetal movement.

## 2013-10-22 NOTE — MAU Provider Note (Signed)
Faculty Practice OB/GYN MAU Attending Note  Subjective:  Patient here for evaluation for possible labor/ROM after being rescheduled for her repeat cesarean section due to her eating a french fry; see previous note for more details.  Reports having clear ROM for a few days, and frequent contractions.   Objective:  Blood pressure 113/89, pulse 114, temperature 98.2 F (36.8 C), temperature source Oral, resp. rate 20, last menstrual period 01/20/2013, SpO2 100.00%. FHT  Baseline 140bpm, moderate variability, +accelerations, no decelerations Toco: None Gen: NAD Abdomen: NT gravid fundus, soft Cervix: closed/thick/long/posterior. Copious white discharge seen, samples for Amnisure and wet prep obtained Ext: 2+ DTRs, no edema, no cyanosis, negative Homan's sign   Results for orders placed during the hospital encounter of 10/22/13 (from the past 24 hour(s))  PREPARE RBC (CROSSMATCH)     Status: None   Collection Time    10/22/13 12:00 PM      Result Value Ref Range   Order Confirmation ORDER PROCESSED BY BLOOD BANK    WET PREP, GENITAL     Status: Abnormal   Collection Time    10/22/13  1:50 PM      Result Value Ref Range   Yeast Wet Prep HPF POC MANY (*) NONE SEEN   Trich, Wet Prep NONE SEEN  NONE SEEN   Clue Cells Wet Prep HPF POC NONE SEEN  NONE SEEN   WBC, Wet Prep HPF POC FEW (*) NONE SEEN  AMNISURE RUPTURE OF MEMBRANE (ROM)     Status: None   Collection Time    10/22/13  1:50 PM      Result Value Ref Range   Amnisure ROM NEGATIVE      Assessment & Plan:  23 y.o. [id number] at 3914w2d here for labor/ROM evaluation.  Negative Amnisure today, patient reassured Wet prep showed candidiasis; Diflucan 150 mg po x 1 given in MAU Patient advised to be NPO after midnight, return tomorrow morning at 8:30am for scheduled cesarean section at 10 am with Dr. Shawnie PonsPratt. Fetal movement and labor precautions reviewed.   Jasmine CollinsUGONNA  Yamilex Borgwardt, MD, FACOG Attending Obstetrician & Gynecologist Faculty  Practice, [facility]

## 2013-10-23 ENCOUNTER — Encounter (HOSPITAL_COMMUNITY): Payer: Medicaid Other | Admitting: Anesthesiology

## 2013-10-23 ENCOUNTER — Encounter (HOSPITAL_COMMUNITY): Payer: Self-pay | Admitting: General Surgery

## 2013-10-23 ENCOUNTER — Encounter (HOSPITAL_COMMUNITY)
Admission: RE | Disposition: A | Discharge status: Home / Self Care | Payer: Self-pay | Source: Ambulatory Visit | Attending: Family Medicine

## 2013-10-23 ENCOUNTER — Inpatient Hospital Stay (HOSPITAL_COMMUNITY)
Admission: RE | Admit: 2013-10-23 | Discharge: 2013-10-25 | DRG: 765 | Disposition: A | Discharge status: Home / Self Care | Payer: Medicaid Other | Source: Ambulatory Visit | Attending: Family Medicine | Admitting: Family Medicine

## 2013-10-23 ENCOUNTER — Inpatient Hospital Stay (HOSPITAL_COMMUNITY): Payer: Medicaid Other | Admitting: Anesthesiology

## 2013-10-23 DIAGNOSIS — O34219 Maternal care for unspecified type scar from previous cesarean delivery: Principal | ICD-10-CM | POA: Diagnosis present

## 2013-10-23 DIAGNOSIS — O99214 Obesity complicating childbirth: Secondary | ICD-10-CM

## 2013-10-23 DIAGNOSIS — A6000 Herpesviral infection of urogenital system, unspecified: Secondary | ICD-10-CM | POA: Diagnosis present

## 2013-10-23 DIAGNOSIS — E669 Obesity, unspecified: Secondary | ICD-10-CM | POA: Diagnosis present

## 2013-10-23 DIAGNOSIS — O98519 Other viral diseases complicating pregnancy, unspecified trimester: Secondary | ICD-10-CM | POA: Diagnosis present

## 2013-10-23 DIAGNOSIS — O0930 Supervision of pregnancy with insufficient antenatal care, unspecified trimester: Secondary | ICD-10-CM

## 2013-10-23 DIAGNOSIS — Z87891 Personal history of nicotine dependence: Secondary | ICD-10-CM

## 2013-10-23 DIAGNOSIS — Z98891 History of uterine scar from previous surgery: Secondary | ICD-10-CM

## 2013-10-23 SURGERY — Surgical Case
Anesthesia: Regional | Site: Abdomen

## 2013-10-23 SURGERY — Surgical Case
Anesthesia: Regional

## 2013-10-23 SURGERY — Surgical Case
Anesthesia: Spinal | Site: Abdomen

## 2013-10-23 MED ORDER — PRENATAL MULTIVITAMIN CH
1.0000 | ORAL_TABLET | Freq: Every day | ORAL | Status: DC
  Administered 2013-10-25: 1 via ORAL
  Filled 2013-10-23 (×2): qty 1

## 2013-10-23 MED ORDER — ZOLPIDEM TARTRATE 5 MG PO TABS
5.0000 mg | ORAL_TABLET | Freq: Every evening | ORAL | Status: DC | PRN
Start: 2013-10-23 — End: 2013-10-25

## 2013-10-23 MED ORDER — ONDANSETRON HCL 4 MG/2ML IJ SOLN: 4.0000 mg | Freq: Three times a day (TID) | INTRAMUSCULAR | Status: DC | PRN

## 2013-10-23 MED ORDER — LACTATED RINGERS IV SOLN
INTRAVENOUS | Status: DC
  Administered 2013-10-23 (×3): via INTRAVENOUS

## 2013-10-23 MED ORDER — MAGNESIUM HYDROXIDE 400 MG/5ML PO SUSP: 30.0000 mL | ORAL | Status: DC | PRN

## 2013-10-23 MED ORDER — MEPERIDINE HCL 25 MG/ML IJ SOLN: 6.2500 mg | INTRAMUSCULAR | Status: DC | PRN

## 2013-10-23 MED ORDER — SENNOSIDES-DOCUSATE SODIUM 8.6-50 MG PO TABS
2.0000 | ORAL_TABLET | ORAL | Status: DC
  Administered 2013-10-24: 2 via ORAL
  Filled 2013-10-23 (×2): qty 2

## 2013-10-23 MED ORDER — MENTHOL 3 MG MT LOZG: 1.0000 | LOZENGE | OROMUCOSAL | Status: DC | PRN

## 2013-10-23 MED ORDER — ACETAMINOPHEN 500 MG PO TABS
1000.0000 mg | ORAL_TABLET | Freq: Four times a day (QID) | ORAL | Status: AC
  Administered 2013-10-23: 1000 mg via ORAL
  Filled 2013-10-23: qty 2

## 2013-10-23 MED ORDER — PHENYLEPHRINE 8 MG IN D5W 100 ML (0.08MG/ML) PREMIX OPTIME
INJECTION | INTRAVENOUS | Status: DC | PRN
  Administered 2013-10-23: 60 ug/min via INTRAVENOUS

## 2013-10-23 MED ORDER — FENTANYL CITRATE 0.05 MG/ML IJ SOLN: 25.0000 ug | INTRAMUSCULAR | Status: DC | PRN

## 2013-10-23 MED ORDER — DIPHENHYDRAMINE HCL 25 MG PO CAPS: 25.0000 mg | ORAL_CAPSULE | Freq: Four times a day (QID) | ORAL | Status: DC | PRN

## 2013-10-23 MED ORDER — CEFAZOLIN SODIUM-DEXTROSE 2-3 GM-% IV SOLR
INTRAVENOUS | Status: AC
  Administered 2013-10-23: 3 g via INTRAVENOUS
  Filled 2013-10-23: qty 50

## 2013-10-23 MED ORDER — KETOROLAC TROMETHAMINE 30 MG/ML IJ SOLN: 30.0000 mg | Freq: Four times a day (QID) | INTRAMUSCULAR | Status: AC | PRN

## 2013-10-23 MED ORDER — ONDANSETRON HCL 4 MG/2ML IJ SOLN: 4.0000 mg | INTRAMUSCULAR | Status: DC | PRN

## 2013-10-23 MED ORDER — MIDAZOLAM HCL 2 MG/2ML IJ SOLN: 0.5000 mg | Freq: Once | INTRAMUSCULAR | Status: DC | PRN

## 2013-10-23 MED ORDER — DIPHENHYDRAMINE HCL 25 MG PO CAPS
25.0000 mg | ORAL_CAPSULE | ORAL | Status: DC | PRN
  Administered 2013-10-23: 25 mg via ORAL
  Filled 2013-10-23: qty 1

## 2013-10-23 MED ORDER — ONDANSETRON HCL 4 MG/2ML IJ SOLN
INTRAMUSCULAR | Status: DC | PRN
  Administered 2013-10-23: 4 mg via INTRAVENOUS

## 2013-10-23 MED ORDER — WITCH HAZEL-GLYCERIN EX PADS: 1.0000 "application " | MEDICATED_PAD | CUTANEOUS | Status: DC | PRN

## 2013-10-23 MED ORDER — TETANUS-DIPHTH-ACELL PERTUSSIS 5-2.5-18.5 LF-MCG/0.5 IM SUSP
0.5000 mL | Freq: Once | INTRAMUSCULAR | Status: DC
  Filled 2013-10-23: qty 0.5

## 2013-10-23 MED ORDER — KETOROLAC TROMETHAMINE 30 MG/ML IJ SOLN
30.0000 mg | Freq: Four times a day (QID) | INTRAMUSCULAR | Status: AC | PRN
  Administered 2013-10-23: 30 mg via INTRAVENOUS

## 2013-10-23 MED ORDER — NALBUPHINE HCL 10 MG/ML IJ SOLN: 5.0000 mg | INTRAMUSCULAR | Status: DC | PRN

## 2013-10-23 MED ORDER — BUPIVACAINE HCL (PF) 0.25 % IJ SOLN
INTRAMUSCULAR | Status: DC | PRN
  Administered 2013-10-23: 30 mL

## 2013-10-23 MED ORDER — SCOPOLAMINE 1 MG/3DAYS TD PT72
1.0000 | MEDICATED_PATCH | Freq: Once | TRANSDERMAL | Status: DC
  Administered 2013-10-23: 1.5 mg via TRANSDERMAL

## 2013-10-23 MED ORDER — FENTANYL CITRATE 0.05 MG/ML IJ SOLN
INTRAMUSCULAR | Status: DC | PRN
  Administered 2013-10-23: 15 ug via INTRATHECAL

## 2013-10-23 MED ORDER — NALOXONE HCL 0.4 MG/ML IJ SOLN: 0.4000 mg | INTRAMUSCULAR | Status: DC | PRN

## 2013-10-23 MED ORDER — SIMETHICONE 80 MG PO CHEW: 80.0000 mg | CHEWABLE_TABLET | ORAL | Status: DC | PRN

## 2013-10-23 MED ORDER — ONDANSETRON HCL 4 MG PO TABS
4.0000 mg | ORAL_TABLET | ORAL | Status: DC | PRN
Start: 2013-10-23 — End: 2013-10-25

## 2013-10-23 MED ORDER — OXYTOCIN 10 UNIT/ML IJ SOLN
40.0000 [IU] | INTRAVENOUS | Status: DC | PRN
  Administered 2013-10-23: 40 [IU] via INTRAVENOUS

## 2013-10-23 MED ORDER — OXYTOCIN 40 UNITS IN LACTATED RINGERS INFUSION - SIMPLE MED: 62.5000 mL/h | INTRAVENOUS | Status: AC

## 2013-10-23 MED ORDER — SIMETHICONE 80 MG PO CHEW
80.0000 mg | CHEWABLE_TABLET | Freq: Three times a day (TID) | ORAL | Status: DC
  Administered 2013-10-23 – 2013-10-25 (×7): 80 mg via ORAL
  Filled 2013-10-23 (×7): qty 1

## 2013-10-23 MED ORDER — IBUPROFEN 600 MG PO TABS
600.0000 mg | ORAL_TABLET | Freq: Four times a day (QID) | ORAL | Status: DC | PRN
  Administered 2013-10-24 – 2013-10-25 (×2): 600 mg via ORAL

## 2013-10-23 MED ORDER — BUPIVACAINE HCL (PF) 0.5 % IJ SOLN
INTRAMUSCULAR | Status: AC
  Filled 2013-10-23: qty 30

## 2013-10-23 MED ORDER — MEASLES, MUMPS & RUBELLA VAC ~~LOC~~ INJ
0.5000 mL | INJECTION | Freq: Once | SUBCUTANEOUS | Status: DC
  Filled 2013-10-23: qty 0.5

## 2013-10-23 MED ORDER — OXYCODONE-ACETAMINOPHEN 5-325 MG PO TABS
1.0000 | ORAL_TABLET | ORAL | Status: DC | PRN
  Administered 2013-10-23 – 2013-10-24 (×4): 2 via ORAL
  Administered 2013-10-24: 1 via ORAL
  Administered 2013-10-25 (×2): 2 via ORAL
  Filled 2013-10-23 (×8): qty 2
  Filled 2013-10-23: qty 1

## 2013-10-23 MED ORDER — KETOROLAC TROMETHAMINE 30 MG/ML IJ SOLN
INTRAMUSCULAR | Status: AC
  Administered 2013-10-23: 30 mg via INTRAVENOUS
  Filled 2013-10-23: qty 1

## 2013-10-23 MED ORDER — DIBUCAINE 1 % RE OINT: 1.0000 "application " | TOPICAL_OINTMENT | RECTAL | Status: DC | PRN

## 2013-10-23 MED ORDER — DIPHENHYDRAMINE HCL 50 MG/ML IJ SOLN
INTRAMUSCULAR | Status: DC | PRN
  Administered 2013-10-23: 25 mg via INTRAVENOUS

## 2013-10-23 MED ORDER — NALOXONE HCL 1 MG/ML IJ SOLN: 1.0000 ug/kg/h | INTRAVENOUS | Status: DC | PRN

## 2013-10-23 MED ORDER — LACTATED RINGERS IV SOLN
INTRAVENOUS | Status: DC | PRN
  Administered 2013-10-23: 11:00:00 via INTRAVENOUS

## 2013-10-23 MED ORDER — LACTATED RINGERS IV SOLN: INTRAVENOUS | Status: DC

## 2013-10-23 MED ORDER — PROMETHAZINE HCL 25 MG/ML IJ SOLN: 6.2500 mg | INTRAMUSCULAR | Status: DC | PRN

## 2013-10-23 MED ORDER — LANOLIN HYDROUS EX OINT: 1.0000 "application " | TOPICAL_OINTMENT | CUTANEOUS | Status: DC | PRN

## 2013-10-23 MED ORDER — MORPHINE SULFATE (PF) 0.5 MG/ML IJ SOLN
INTRAMUSCULAR | Status: DC | PRN
  Administered 2013-10-23: .1 mg via INTRATHECAL

## 2013-10-23 MED ORDER — SIMETHICONE 80 MG PO CHEW
80.0000 mg | CHEWABLE_TABLET | ORAL | Status: DC
  Administered 2013-10-24: 80 mg via ORAL
  Filled 2013-10-23 (×2): qty 1

## 2013-10-23 MED ORDER — SODIUM CHLORIDE 0.9 % IJ SOLN: 3.0000 mL | INTRAMUSCULAR | Status: DC | PRN

## 2013-10-23 MED ORDER — BUPIVACAINE IN DEXTROSE 0.75-8.25 % IT SOLN
INTRATHECAL | Status: DC | PRN
  Administered 2013-10-23: 1.6 mL via INTRATHECAL

## 2013-10-23 MED ORDER — METOCLOPRAMIDE HCL 5 MG/ML IJ SOLN: 10.0000 mg | Freq: Three times a day (TID) | INTRAMUSCULAR | Status: DC | PRN

## 2013-10-23 MED ORDER — SCOPOLAMINE 1 MG/3DAYS TD PT72
MEDICATED_PATCH | TRANSDERMAL | Status: AC
  Administered 2013-10-23: 1.5 mg via TRANSDERMAL
  Filled 2013-10-23: qty 1

## 2013-10-23 MED ORDER — DIPHENHYDRAMINE HCL 50 MG/ML IJ SOLN: 25.0000 mg | INTRAMUSCULAR | Status: DC | PRN

## 2013-10-23 MED ORDER — DIPHENHYDRAMINE HCL 50 MG/ML IJ SOLN
12.5000 mg | INTRAMUSCULAR | Status: DC | PRN
  Administered 2013-10-23: 12.5 mg via INTRAVENOUS
  Filled 2013-10-23: qty 1

## 2013-10-23 MED ORDER — SCOPOLAMINE 1 MG/3DAYS TD PT72
1.0000 | MEDICATED_PATCH | Freq: Once | TRANSDERMAL | Status: DC
  Filled 2013-10-23: qty 1

## 2013-10-23 MED ORDER — IBUPROFEN 600 MG PO TABS
600.0000 mg | ORAL_TABLET | Freq: Four times a day (QID) | ORAL | Status: DC
  Administered 2013-10-23 – 2013-10-25 (×6): 600 mg via ORAL
  Filled 2013-10-23 (×8): qty 1

## 2013-10-23 SURGICAL SUPPLY — 36 items
BENZOIN TINCTURE PRP APPL 2/3 (GAUZE/BANDAGES/DRESSINGS) ×3 IMPLANT
CLAMP CORD UMBIL (MISCELLANEOUS) ×3 IMPLANT
CLOSURE WOUND 1/2 X4 (GAUZE/BANDAGES/DRESSINGS) ×1
CLOTH BEACON ORANGE TIMEOUT ST (SAFETY) ×3 IMPLANT
DECANTER SPIKE VIAL GLASS SM (MISCELLANEOUS) ×3 IMPLANT
DERMABOND ADVANCED (GAUZE/BANDAGES/DRESSINGS) ×2
DERMABOND ADVANCED .7 DNX12 (GAUZE/BANDAGES/DRESSINGS) ×1 IMPLANT
DRAPE LG THREE QUARTER DISP (DRAPES) ×3 IMPLANT
DRSG OPSITE POSTOP 4X10 (GAUZE/BANDAGES/DRESSINGS) ×3 IMPLANT
DURAPREP 26ML APPLICATOR (WOUND CARE) ×3 IMPLANT
ELECT REM PT RETURN 9FT ADLT (ELECTROSURGICAL) ×6
ELECTRODE REM PT RTRN 9FT ADLT (ELECTROSURGICAL) ×2 IMPLANT
GAUZE SPONGE 4X4 12PLY STRL LF (GAUZE/BANDAGES/DRESSINGS) ×3 IMPLANT
GLOVE BIO SURGEON STRL SZ7 (GLOVE) ×6 IMPLANT
GLOVE BIOGEL PI IND STRL 7.0 (GLOVE) ×9 IMPLANT
GLOVE BIOGEL PI INDICATOR 7.0 (GLOVE) ×18
GLOVE ECLIPSE 7.0 STRL STRAW (GLOVE) ×6 IMPLANT
GLOVE SURG SS PI 7.0 STRL IVOR (GLOVE) ×24 IMPLANT
GOWN STRL REUS W/TWL LRG LVL3 (GOWN DISPOSABLE) ×18 IMPLANT
NEEDLE HYPO 22GX1.5 SAFETY (NEEDLE) ×3 IMPLANT
NEEDLE HYPO 25X5/8 SAFETYGLIDE (NEEDLE) ×3 IMPLANT
NS IRRIG 1000ML POUR BTL (IV SOLUTION) ×3 IMPLANT
PACK C SECTION WH (CUSTOM PROCEDURE TRAY) ×3 IMPLANT
PAD OB MATERNITY 4.3X12.25 (PERSONAL CARE ITEMS) ×6 IMPLANT
RTRCTR C-SECT PINK 25CM LRG (MISCELLANEOUS) ×3 IMPLANT
STAPLER VISISTAT 35W (STAPLE) IMPLANT
STRIP CLOSURE SKIN 1/2X4 (GAUZE/BANDAGES/DRESSINGS) ×2 IMPLANT
SUT VIC AB 0 CTX 36 (SUTURE) ×6
SUT VIC AB 0 CTX36XBRD ANBCTRL (SUTURE) ×3 IMPLANT
SUT VIC AB 2-0 CT1 27 (SUTURE) ×2
SUT VIC AB 2-0 CT1 TAPERPNT 27 (SUTURE) ×1 IMPLANT
SYR 30ML LL (SYRINGE) ×3 IMPLANT
TAPE CLOTH SURG 4X10 WHT LF (GAUZE/BANDAGES/DRESSINGS) ×3 IMPLANT
TOWEL OR 17X24 6PK STRL BLUE (TOWEL DISPOSABLE) ×3 IMPLANT
TRAY FOLEY CATH 14FR (SET/KITS/TRAYS/PACK) ×3 IMPLANT
WATER STERILE IRR 1000ML POUR (IV SOLUTION) ×3 IMPLANT

## 2013-10-23 SURGICAL SUPPLY — 33 items
CLAMP CORD UMBIL (MISCELLANEOUS) IMPLANT
CLOTH BEACON ORANGE TIMEOUT ST (SAFETY) IMPLANT
DRAPE LG THREE QUARTER DISP (DRAPES) IMPLANT
DRSG OPSITE POSTOP 4X10 (GAUZE/BANDAGES/DRESSINGS) IMPLANT
DURAPREP 26ML APPLICATOR (WOUND CARE) IMPLANT
ELECT REM PT RETURN 9FT ADLT (ELECTROSURGICAL)
ELECTRODE REM PT RTRN 9FT ADLT (ELECTROSURGICAL) IMPLANT
EXTRACTOR VACUUM M CUP 4 TUBE (SUCTIONS) IMPLANT
EXTRACTOR VACUUM M CUP 4' TUBE (SUCTIONS)
GLOVE BIO SURGEON STRL SZ7 (GLOVE) IMPLANT
GLOVE BIOGEL PI IND STRL 7.0 (GLOVE) IMPLANT
GLOVE BIOGEL PI INDICATOR 7.0 (GLOVE)
GOWN STRL REUS W/TWL LRG LVL3 (GOWN DISPOSABLE) IMPLANT
KIT ABG SYR 3ML LUER SLIP (SYRINGE) IMPLANT
NEEDLE HYPO 22GX1.5 SAFETY (NEEDLE) IMPLANT
NEEDLE HYPO 25X5/8 SAFETYGLIDE (NEEDLE) IMPLANT
NS IRRIG 1000ML POUR BTL (IV SOLUTION) IMPLANT
PACK C SECTION WH (CUSTOM PROCEDURE TRAY) IMPLANT
PAD ABD 7.5X8 STRL (GAUZE/BANDAGES/DRESSINGS) IMPLANT
PAD OB MATERNITY 4.3X12.25 (PERSONAL CARE ITEMS) IMPLANT
RTRCTR C-SECT PINK 25CM LRG (MISCELLANEOUS) IMPLANT
STAPLER VISISTAT 35W (STAPLE) IMPLANT
SUT PDS AB 0 CT1 27 (SUTURE) IMPLANT
SUT PDS AB 0 CTX 36 PDP370T (SUTURE) IMPLANT
SUT PLAIN 2 0 XLH (SUTURE) IMPLANT
SUT VIC AB 0 CT1 36 (SUTURE) IMPLANT
SUT VIC AB 0 CTX 36 (SUTURE)
SUT VIC AB 0 CTX36XBRD ANBCTRL (SUTURE) IMPLANT
SUT VIC AB 4-0 KS 27 (SUTURE) IMPLANT
SYR 30ML LL (SYRINGE) IMPLANT
TOWEL OR 17X24 6PK STRL BLUE (TOWEL DISPOSABLE) IMPLANT
TRAY FOLEY CATH 14FR (SET/KITS/TRAYS/PACK) IMPLANT
WATER STERILE IRR 1000ML POUR (IV SOLUTION) IMPLANT

## 2013-10-23 NOTE — Op Note (Signed)
I was present for the entirety of this procedure. 

## 2013-10-23 NOTE — Anesthesia Postprocedure Evaluation (Signed)
  Anesthesia Post-op Note  Anesthesia Post Note  Patient: Jasmine Hudson  Procedure(s) Performed: Procedure(s) (LRB): CESAREAN SECTION (N/A)  Anesthesia type: Spinal  Patient location: PACU  Post pain: Pain level controlled  Post assessment: Post-op Vital signs reviewed  Last Vitals:  Filed Vitals:   10/23/13 1350  BP: 132/71  Pulse: 80  Temp: 36.4 C  Resp: 18    Post vital signs: Reviewed  Level of consciousness: awake  Complications: No apparent anesthesia complications

## 2013-10-23 NOTE — Interval H&P Note (Signed)
History and Physical Interval Note:  10/23/2013 9:40 AM  Jasmine Hudson  has presented today for surgery, with the diagnosis of cesearan section x three  The various methods of treatment have been discussed with the patient and family. After consideration of risks, benefits and other options for treatment, the patient has consented to  Procedure(s): CESAREAN SECTION (N/A) as a surgical intervention .  The patient's history has been reviewed, patient examined, no change in status, stable for surgery.  I have reviewed the patient's chart and labs.  Questions were answered to the patient's satisfaction.     [name] S

## 2013-10-23 NOTE — Anesthesia Procedure Notes (Signed)
Spinal  Patient location during procedure: OR Start time: 10/23/2013 10:25 AM Staffing Anesthesiologist: CASSIDY, AMY Performed by: anesthesiologist  Preanesthetic Checklist Completed: patient identified, site marked, surgical consent, pre-op evaluation, timeout performed, IV checked, risks and benefits discussed and monitors and equipment checked Spinal Block Patient position: sitting Prep: site prepped and draped and DuraPrep Patient monitoring: heart rate, continuous pulse ox and blood pressure Approach: midline Location: L3-4 Injection technique: single-shot Needle Needle type: Pencan  Needle gauge: 24 G Needle length: 10 cm Assessment Sensory level: T4 Additional Notes Clear free flow CSF on first attempt.  No paresthesia.  Patient extremely anxious, but tolerated procedure well with coaching.  No apparent complications.  Jasmine DecemberA. Cassidy, MD

## 2013-10-23 NOTE — OR Nursing (Addendum)
Small abrasion located on the left lower lateral of the abdomen.  Dermabond applied to the abrasion by Dr. Reola CalkinsBeck.

## 2013-10-23 NOTE — H&P (View-Only) (Signed)
Preoperative History and Physical  Jasmine EnsignKaalea T Karle is a 23 y.o. G3P2002 at 6355w2d here for scheduled repeat cesarean section.  Patient reports having irregular contractions and possible leaking of clear fluid.  She reported passing her mucus plug yesterday.  No bleeding. Good FM.  No other concerns.  Of note, patient did inform Dr, Brayton CavesFreeman Jackson that she ate a french fry around 10 am this morning.  As a result, her scheduled procedure a will not be able to be done until after 6 pm today or later.  Patient is very upset about this development.   Past Medical History  Diagnosis Date  . Herpes   . UTI in pregnancy 10/2013    currently on antibotics   Past Surgical History  Procedure Laterality Date  . Cesarean section  2007  . Cesarean section  02/29/2012    Procedure: CESAREAN SECTION;  Surgeon: Tilda BurrowJohn V Ferguson, MD;  Location: [facility];  Service: Gynecology;  Laterality: N/A;  Repeat    OB History   Grav Para Term Preterm Abortions TAB SAB Ect Mult Living   3 2 2  0      2     Patient denies any cervical dysplasia or STIs. Prescriptions prior to admission  Medication Sig Dispense Refill  . cephALEXin (KEFLEX) 500 MG capsule Take 1 capsule (500 mg total) by mouth 4 (four) times daily.  28 capsule  2  . metroNIDAZOLE (FLAGYL) 500 MG tablet Take two tablets by mouth twice a day, for one day.  Or you can take all four tablets at once if you can tolerate it.  4 tablet  0    No Known Allergies Social History:   reports that she quit smoking about 3 years ago. Her smoking use included Cigars. She has never used smokeless tobacco. She reports that she does not drink alcohol or use illicit drugs. Family History  Problem Relation Age of Onset  . Hypertension Mother     Review of Systems: Noncontributory  PHYSICAL EXAM: Blood pressure 113/89, pulse 114, temperature 98.2 F (36.8 C), temperature source Oral, resp. rate 20, last menstrual period 01/20/2013, SpO2 100.00%. General appearance -  alert, well appearing, and in no distress Chest - clear to auscultation, no wheezes, rales or rhonchi, symmetric air entry Heart - normal rate and regular rhythm Abdomen - soft, nontender, nondistended,gravid, well-healed previous cesarean scars Pelvic - examination not indicated Extremities - peripheral pulses normal, no pedal edema, no clubbing or cyanosis  Labs: Results for orders placed during the hospital encounter of 10/22/13 (from the past 336 hour(s))  PREPARE RBC (CROSSMATCH)   Collection Time    10/22/13 12:00 PM      Result Value Ref Range   Order Confirmation ORDER PROCESSED BY BLOOD BANK    Results for orders placed during the hospital encounter of 10/21/13 (from the past 336 hour(s))  CBC   Collection Time    10/21/13 10:45 AM      Result Value Ref Range   WBC 10.9 (*) 4.0 - 10.5 K/uL   RBC 4.19  3.87 - 5.11 MIL/uL   Hemoglobin 11.4 (*) 12.0 - 15.0 g/dL   HCT 01.033.8 (*) 27.236.0 - 53.646.0 %   MCV 80.7  78.0 - 100.0 fL   MCH 27.2  26.0 - 34.0 pg   MCHC 33.7  30.0 - 36.0 g/dL   RDW 64.415.4  03.411.5 - 74.215.5 %   Platelets 253  150 - 400 K/uL  RPR   Collection Time  10/21/13 10:45 AM      Result Value Ref Range   RPR NON REACTIVE  NON REACTIVE  TYPE AND SCREEN   Collection Time    10/21/13 10:45 AM      Result Value Ref Range   ABO/RH(D) A POS     Antibody Screen NEG     Sample Expiration 10/24/2013     Unit Number [id number]     Blood Component Type RED CELLS,LR     Unit division 00     Status of Unit ALLOCATED     Transfusion Status OK TO TRANSFUSE     Crossmatch Result Compatible     Unit Number [id number]     Blood Component Type RED CELLS,LR     Unit division 00     Status of Unit ALLOCATED     Transfusion Status OK TO TRANSFUSE     Crossmatch Result Compatible    Results for orders placed in visit on 10/14/13 (from the past 336 hour(s))  POCT URINALYSIS DIP (DEVICE)   Collection Time    10/14/13  9:26 AM      Result Value Ref Range   Glucose, UA  NEGATIVE  NEGATIVE mg/dL   Bilirubin Urine NEGATIVE  NEGATIVE   Ketones, ur NEGATIVE  NEGATIVE mg/dL   Specific Gravity, Urine 1.020  1.005 - 1.030   Hgb urine dipstick TRACE (*) NEGATIVE   pH 5.5  5.0 - 8.0   Protein, ur NEGATIVE  NEGATIVE mg/dL   Urobilinogen, UA 0.2  0.0 - 1.0 mg/dL   Nitrite NEGATIVE  NEGATIVE   Leukocytes, UA SMALL (*) NEGATIVE  GC/CHLAMYDIA PROBE AMP   Collection Time    10/14/13  2:26 PM      Result Value Ref Range   CT Probe RNA NEGATIVE     GC Probe RNA NEGATIVE    CULTURE, BETA STREP (GROUP B ONLY)   Collection Time    10/14/13  2:26 PM      Result Value Ref Range   Organism ID, Bacteria NO GROUP B STREP (S.AGALACTIAE) ISOLATED      Assessment: Patient Active Problem List   Diagnosis Date Noted  . Trichomoniasis 09/18/2013  . Late prenatal care complicating pregnancy 09/10/2013  . H/O: C-section 09/10/2013  . Herpes 11/22/2011    Plan: Patient initially decided that she wants to undergo VBAC instead; risks of VBAC discussed including increased risk of uterine rupture that can result in internal hemorrhage, hysterectomy, neonatal death or neurological injury.complications.   She changed her mind and wants to reschedule repeat CS; this was rescheduled tomorrow 10/23/13 with Dr. Shawnie Pons at 10 am.  Patient told to be NPO after midnight, come to hospital at 8:30 am.    Patient then reported she wanted to be evaluated for possible ROM and labor.  She was sent to MAU for further evaluation. She and her mother desire the Amnisure test, this will be done in MAU.  MAU staff notified.  NPO until after MAU evaluation.    Jaynie Collins, M.D. 10/22/2013 1:39 PM

## 2013-10-23 NOTE — Transfer of Care (Signed)
Immediate Anesthesia Transfer of Care Note  Patient: Jasmine Hudson  Procedure(s) Performed: Procedure(s) with comments: CESAREAN SECTION (N/A) - Small abrasion located on the left lower lateral of the abdomen.  Dermabond applied to the abrasion by Dr. Reola CalkinsBeck.  See OR note.  Patient Location: PACU  Anesthesia Type:Spinal  Level of Consciousness: awake, alert  and oriented  Airway & Oxygen Therapy: Patient Spontanous Breathing  Post-op Assessment: Report given to PACU RN and Post -op Vital signs reviewed and stable  Post vital signs: Reviewed and stable  Complications: No apparent anesthesia complications

## 2013-10-23 NOTE — Op Note (Signed)
Jasmine Hudson PROCEDURE DATE: 10/23/2013  PREOPERATIVE DIAGNOSES: Intrauterine pregnancy at  4844w3d weeks gestation; elective repeat c/s, previous c-section x2  POSTOPERATIVE DIAGNOSES: The same  PROCEDURE: Repeat Low Transverse Cesarean Section  SURGEON:  Dr. Tinnie Gensanya Pratt  ASSISTANT:  Dr. Rulon AbideKeli Maniyah Moller  ANESTHESIOLOGIST: Dr. Dana AllanAmy Cassidy  INDICATIONS: Jasmine Hudson is a 23 y.o. [phone] at 8544w3d here for cesarean section secondary to the indications listed under preoperative diagnoses; please see preoperative note for further details.  The risks of cesarean section were discussed with the patient including but were not limited to: bleeding which may require transfusion or reoperation; infection which may require antibiotics; injury to bowel, bladder, ureters or other surrounding organs; injury to the fetus; need for additional procedures including hysterectomy in the event of a life-threatening hemorrhage; placental abnormalities wth subsequent pregnancies, incisional problems, thromboembolic phenomenon and other postoperative/anesthesia complications.   The patient concurred with the proposed plan, giving informed written consent for the procedure.    FINDINGS:  Viable female infant in cephalic presentation.  Apgars 8 and 9.  Clear amniotic fluid.  Intact placenta, three vessel cord.  Normal uterus, fallopian tubes and ovaries bilaterally. Scarring of the omentum to the uterus and uterus to the abdominal wall.     ANESTHESIA: Spinal INTRAVENOUS FLUIDS: 2000 ml ESTIMATED BLOOD LOSS: 600 ml URINE OUTPUT:  300 ml SPECIMENS: Placenta sent to L&D COMPLICATIONS: None immediate  PROCEDURE IN DETAIL:  The patient preoperatively received intravenous antibiotics and had sequential compression devices applied to her lower extremities.  She was then taken to the operating room where spinal anesthesia was administered and was found to be adequate. She was then placed in a dorsal supine position with a  leftward tilt, and prepped and draped in a sterile manner.  A foley catheter was placed into her bladder and attached to constant gravity.  After an adequate timeout was performed, a Pfannenstiel skin incision was made with scalpel and carried through to the underlying layer of fascia. Of note, at time of incision the drape had some of the skin taught and a superficial incision occurred approx 3cm in diameter inferior to the main incision and on the patient's left.  The fascia was incised in the midline, and this incision was extended bilaterally using the Mayo scissors.  Kocher clamps were applied to the superior aspect of the fascial incision and the underlying rectus muscles were dissected off bluntly. The rectus muscles were separated in the midline bluntly and the peritoneum was entered bluntly. Omental adhesions were noted to the uterine wall and of the uterus up to the abdominal wall.  Attention was turned to the lower uterine segment where a low transverse hysterotomy was made with a scalpel and extended bilaterally bluntly.  The infant was successfully delivered, the cord was clamped and cut and the infant was handed over to awaiting neonatology team. Uterine massage was then administered, and the placenta delivered intact with a three-vessel cord. The uterus was then cleared of clot and debris.  The hysterotomy was closed with 0 Vicryl in a running locked fashion, and several overlaying figure of 8 sutures were placed for hemostasis.  The omental adhesion was grasped with 2 kelly claps and ligated and sutured with 2-0 vicryl ties.  The pelvis was cleared of all clot and debris. Hemostasis was confirmed on all surfaces.  The peritoneum was closed with a 2-0 vicryl in a purse string fashion. The fascia was then closed using 0 Vicryl in a running fashion.  The subcutaneous layer was irrigated, then reapproximated with 2-0 plain gut interrupted stitches, and 30 ml of 0.5% Marcaine was injected subcutaneously  around the incision.  The skin was closed with a 4-0 Vicryl subcuticular stitch. The final knot was accidentally cut so Dermabond was placed over the incision for secondary skin turgor with good wound integrity noted. The small superficial abrasion was sealed with dermabond as well with good cosmetic results. The patient tolerated the procedure well. Sponge, lap, instrument and needle counts were correct x 2.  She was taken to the recovery room in stable condition.   Jasmine Hudson, Redmond Baseman, MD

## 2013-10-24 LAB — BIRTH TISSUE RECOVERY COLLECTION (PLACENTA DONATION)

## 2013-10-24 LAB — TYPE AND SCREEN
ABO/RH(D): A POS
ANTIBODY SCREEN: NEGATIVE
Unit division: 0
Unit division: 0

## 2013-10-24 LAB — CBC
HCT: 31.5 % — ABNORMAL LOW (ref 36.0–46.0)
Hemoglobin: 10.3 g/dL — ABNORMAL LOW (ref 12.0–15.0)
MCH: 27.1 pg (ref 26.0–34.0)
MCHC: 32.7 g/dL (ref 30.0–36.0)
MCV: 82.9 fL (ref 78.0–100.0)
PLATELETS: 228 10*3/uL (ref 150–400)
RBC: 3.8 MIL/uL — ABNORMAL LOW (ref 3.87–5.11)
RDW: 15.6 % — AB (ref 11.5–15.5)
WBC: 10.4 10*3/uL (ref 4.0–10.5)

## 2013-10-24 NOTE — Addendum Note (Signed)
Addendum created 10/24/13 1028 by Graciela HusbandsWynn O Chiann Goffredo, CRNA   Modules edited: Notes Section   Notes Section:  File: [id number]

## 2013-10-24 NOTE — Lactation Note (Signed)
This note was copied from the chart of Jasmine Hudson. Lactation Consultation Note: Initial visit with mom. She reports that baby has latched but she is giving formula also. Last had formula about 1 hour ago. Reports that nipples are inverted- has DEBP in room. Encouraged to call for assist prn. No questions at present. BF brochure given with resources for support after DC.  Patient Name: Jasmine Cinda QuestKaalea Full WUJWJ'XToday's Date: 10/24/2013 Reason for consult: Initial assessment   Maternal Data Formula Feeding for Exclusion: Yes Reason for exclusion: Mother's choice to formula and breast feed on admission Infant to breast within first hour of birth: Yes Does the patient have breastfeeding experience prior to this delivery?: Yes  Feeding    LATCH Score/Interventions                      Lactation Tools Discussed/Used     Consult Status Consult Status: Follow-up Date: 10/25/13 Follow-up type: In-patient    Pamelia HoitWeeks, Keisuke Hollabaugh D 10/24/2013, 10:45 AM

## 2013-10-24 NOTE — Progress Notes (Signed)
Clinical Social Work Department PSYCHOSOCIAL ASSESSMENT - MATERNAL/CHILD 10/24/2013  Patient:  Jasmine Hudson,Jasmine Hudson  Account Number:  [id number]  Admit Date:  10/23/2013  Childs Name:   undecided at this time    Clinical Social Worker:  CUMI BEVEL, LCSW   Date/Time:  10/24/2013 11:00 AM  Date Referred:  10/23/2013   Referral source  Central Nursery     Referred reason  [facility]   Other referral source:    I:  FAMILY / HOME ENVIRONMENT Child's legal guardian:    Guardian - Name Guardian - Age Guardian - Address  Jasmine Hudson,Jasmine Hudson 22 [address]  [place], [place] [address]  [place], [place] 36    Other household support members/support persons Other support:   Maternal grandmother very supportive    II  PSYCHOSOCIAL DATA Information Source:    Financial and Community Resources Employment:   FOB is complyed   Financial resources:  Medicaid If Medicaid - County:   Other  WIC   School / Grade:   Maternity Care Coordinator / Child Services Coordination / Early Interventions:  Cultural issues impacting care:    III  STRENGTHS Strengths  Supportive family/friends  Home prepared for Child (including basic supplies)  Adequate Resources   Strength comment:    IV  RISK FACTORS AND CURRENT PROBLEMS Current Problem:       V  SOCIAL WORK ASSESSMENT Met with mother who was initially very guarded and unreceptive.  She is a single parent with two other dependents.  FOB and grandmother was also present, but asleep.     Father of baby is employed and reportedly supportive.   Mother was irate about newborn being tested for drugs.  She communicated feeling as if she was being singled out.  Explained the hospital drug screening policy at length, and the Pediatrician also spoke with her regarding the reason for the testing and provided her with a copy of the policy.  She became less defensive, after a lengthy discussion.  She denies any hx of substance abuse or mental illness.  Mother states that  she did not get prenatal care because she was considering not going through with the pregnancy.    Mother states that she is happy with her decision to follow through with the pregnancy.  She and her children reside with maternal grandmother.  She and FOB have lived together in the past and been in a relationship for over three years.  Informed that he is very involved and supportive of his children.    Provided supportive feedback and allowed mother to vent.  UDS on newborn was negative.     Mother informed of social work availability.      VI SOCIAL WORK PLAN Social Work Plan  No Barriers to Discharge   Type of pt/family education:   If child protective services report - county:   If child protective services report - date:   Information/referral to community resources comment:   Other social work plan:   Will continue to monitor drug screen.     

## 2013-10-24 NOTE — Anesthesia Postprocedure Evaluation (Signed)
Anesthesia Post Note  Patient: Jasmine EnsignKaalea T Hudson  Procedure(s) Performed: Procedure(s) (LRB): CESAREAN SECTION (N/A)  Anesthesia type: Spinal  Patient location: Mother/Baby  Post pain: Pain level controlled  Post assessment: Post-op Vital signs reviewed  Last Vitals:  Filed Vitals:   10/24/13 0935  BP: 111/63  Pulse: 79  Temp: 36.8 C  Resp: 20    Post vital signs: Reviewed  Level of consciousness: awake  Complications: No apparent anesthesia complications

## 2013-10-24 NOTE — Progress Notes (Addendum)
Subjective: Postpartum Day 1: RLTCesarean Delivery Eating, drinking, voiding, ambulating well.  Lochia and pain wnl.  Denies dizziness, lightheadedness, or sob. No complaints. Breast/bottle feeding  Objective: Vital signs in last 24 hours: Temp:  [97.5 F (36.4 C)-98.2 F (36.8 C)] 98 F (36.7 C) (03/14 0611) Pulse Rate:  [71-101] 71 (03/14 0611) Resp:  [16-20] 18 (03/14 0611) BP: (104-132)/(56-86) 107/68 mmHg (03/14 0611) SpO2:  [99 %-100 %] 100 % (03/14 0611) Weight:  [121.11 kg (267 lb)] 121.11 kg (267 lb) (03/13 1350)  Physical Exam:  General: alert, cooperative and no distress Lochia: appropriate Uterine Fundus: firm Incision: healing well, no significant drainage DVT Evaluation: No evidence of DVT seen on physical exam. Negative Homan's sign. No cords or calf tenderness. No significant calf/ankle edema.   Recent Labs  10/21/13 1045 10/24/13 0743  HGB 11.4* 10.3*  HCT 33.8* 31.5*    Assessment/Plan: Status post Cesarean section. Doing well postoperatively.  Continue current care. Plan for d/c tomorrow Undecided about contraception, discussed options  Jasmine Hudson, Jasmine Hudson 10/24/2013, 9:33 AM

## 2013-10-25 MED ORDER — PRENATAL MULTIVITAMIN CH: 1.0000 | ORAL_TABLET | Freq: Every day | ORAL | Status: DC

## 2013-10-25 MED ORDER — OXYCODONE-ACETAMINOPHEN 5-325 MG PO TABS: 1.0000 | ORAL_TABLET | ORAL | Status: DC | PRN

## 2013-10-25 MED ORDER — DOCUSATE SODIUM 100 MG PO CAPS: 100.0000 mg | ORAL_CAPSULE | Freq: Two times a day (BID) | ORAL | Status: DC

## 2013-10-25 MED ORDER — IBUPROFEN 600 MG PO TABS: 600.0000 mg | ORAL_TABLET | Freq: Four times a day (QID) | ORAL | Status: DC | PRN

## 2013-10-25 MED ORDER — BISACODYL 10 MG RE SUPP
10.0000 mg | Freq: Once | RECTAL | Status: AC
  Administered 2013-10-25: 10 mg via RECTAL
  Filled 2013-10-25: qty 1

## 2013-10-25 NOTE — Lactation Note (Signed)
This note was copied from the chart of Jasmine Hudson. Lactation Consultation Note  Patient Name: Jasmine Hudson JXBJY'NToday's Date: 10/25/2013 Reason for consult: Follow-up assessment;Other (Comment) (mom has switched to formula feeding per Pediatrician) Mom is now formula feeding with soy formula (noted by Dr. Ronalee RedHartsell this morning)   Maternal Data    Feeding    LATCH Score/Interventions               N/A       Lactation Tools Discussed/Used     Consult Status Consult Status: Complete    Lynda RainwaterBryant, Sarinah Doetsch [place] 10/25/2013, 4:43 PM

## 2013-10-25 NOTE — Discharge Summary (Signed)
Physician Obstetric Discharge Summary  Patient ID: Jasmine Hudson MRN: [id number] DOB/AGE: 10-27-1990 23 y.o.  Reason for Admission: cesarean section Prenatal Procedures: none Intrapartum Procedures: cesarean: low cervical, transverse Postpartum Procedures: none Complications-Operative and Postpartum: none  Delivery Note At 10:42 AM a healthy female was delivered via C-Section, Low Transverse (Presentation: ;  ).  APGAR: 8, 9; weight 7 lb 8.1 oz (3405 g).   Placenta status: Intact, Manual removal.  Cord: 3 vessels with the following complications:   Anesthesia: Spinal  Est. Blood Loss (600mL):   Mom to postpartum.  Baby to Couplet care / Skin to Skin.  H/H:  Lab Results  Component Value Date/Time   HGB 10.3* 10/24/2013  7:43 AM   HCT 31.5* 10/24/2013  7:43 AM    Brief Hospital Course: Jasmine Hudson is a [id number] who underwent repeat cesarean section on 10/23/2013.  Patient had an uncomplicated surgery; for further details of this surgery, please refer to the operative note.  Patient had an uncomplicated postpartum course.  By time of discharge on POD/PPD#2, her pain was controlled on oral pain medications; she had appropriate lochia and was ambulating, voiding without difficulty, tolerating regular diet and passing flatus.  She was deemed stable for discharge to home.    Physical Exam:  General: alert, cooperative and no distress Lochia: appropriate Uterine Fundus: firm Incision: healing well, no significant drainage, no dehiscence, no significant erythema DVT Evaluation: No evidence of DVT seen on physical exam. Negative Homan's sign. No cords or calf tenderness. No significant calf/ankle edema.  Discharge Diagnoses: Term Pregnancy-delivered  Discharge Information: Date: 10/25/2013 Activity: pelvic rest Diet: routine Baby feeding: plans to breastfeed Contraception: IUD, Mirena Medications: Ibuprofen and Percocet Discharged Condition: good Instructions: refer to practice  specific booklet Discharge to: home  Signed: Wenda LowJames Taiven Greenley, M.D. FM PGY-1  10/25/2013, 7:39 AM

## 2013-10-25 NOTE — Discharge Summary (Addendum)
I spoke with and examined patient and agree with resident/PA/SNM's note and plan of care.  Cheral MarkerKimberly R. Auston Halfmann, CNM, [facility] 10/25/2013 8:01 AM

## 2013-10-25 NOTE — Discharge Instructions (Signed)
Postpartum Care After Cesarean Delivery °After you deliver your newborn (postpartum period), the usual stay in the hospital is 24 72 hours. If there were problems with your labor or delivery, or if you have other medical problems, you might be in the hospital longer.  °While you are in the hospital, you will receive help and instructions on how to care for yourself and your newborn during the postpartum period.  °While you are in the hospital: °· It is normal for you to have pain or discomfort from the incision in your abdomen. Be sure to tell your nurses when you are having pain, where the pain is located, and what makes the pain worse. °· If you are breastfeeding, you may feel uncomfortable contractions of your uterus for a couple of weeks. This is normal. The contractions help your uterus get back to normal size. °· It is normal to have some bleeding after delivery. °· For the first 1 3 days after delivery, the flow is red and the amount may be similar to a period. °· It is common for the flow to start and stop. °· In the first few days, you may pass some small clots. Let your nurses know if you begin to pass large clots or your flow increases. °· Do not  flush blood clots down the toilet before having the nurse look at them. °· During the next 3 10 days after delivery, your flow should become more watery and pink or brown-tinged in color. °· Ten to fourteen days after delivery, your flow should be a small amount of yellowish-white discharge. °· The amount of your flow will decrease over the first few weeks after delivery. Your flow may stop in 6 8 weeks. Most women have had their flow stop by 12 weeks after delivery. °· You should change your sanitary pads frequently. °· Wash your hands thoroughly with soap and water for at least 20 seconds after changing pads, using the toilet, or before holding or feeding your newborn. °· Your intravenous (IV) tubing will be removed when you are drinking enough fluids. °· The  urine drainage tube (urinary catheter) that was inserted before delivery may be removed within 6 8 hours after delivery or when feeling returns to your legs. You should feel like you need to empty your bladder within the first 6 8 hours after the catheter has been removed. °· In case you become weak, lightheaded, or faint, call your nurse before you get out of bed for the first time and before you take a shower for the first time. °· Within the first few days after delivery, your breasts may begin to feel tender and full. This is called engorgement. Breast tenderness usually goes away within 48 72 hours after engorgement occurs. You may also notice milk leaking from your breasts. If you are not breastfeeding, do not stimulate your breasts. Breast stimulation can make your breasts produce more milk. °· Spending as much time as possible with your newborn is very important. During this time, you and your newborn can feel close and get to know each other. Having your newborn stay in your room (rooming in) will help to strengthen the bond with your newborn. It will give you time to get to know your newborn and become comfortable caring for your newborn. °· Your hormones change after delivery. Sometimes the hormone changes can temporarily cause you to feel sad or tearful. These feelings should not last more than a few days. If these feelings last longer   than that, you should talk to your caregiver. °· If desired, talk to your caregiver about methods of family planning or contraception. °· Talk to your caregiver about immunizations. Your caregiver may want you to have the following immunizations before leaving the hospital: °· Tetanus, diphtheria, and pertussis (Tdap) or tetanus and diphtheria (Td) immunization. It is very important that you and your family (including grandparents) or others caring for your newborn are up-to-date with the Tdap or Td immunizations. The Tdap or Td immunization can help protect your newborn  from getting ill. °· Rubella immunization. °· Varicella (chickenpox) immunization. °· Influenza immunization. You should receive this annual immunization if you did not receive the immunization during your pregnancy. °Document Released: 04/23/2012 Document Reviewed: 04/23/2012 °Exit[employer]® Patient Information ©2014 [employer]. ° °

## 2013-10-26 ENCOUNTER — Encounter (HOSPITAL_COMMUNITY): Payer: Self-pay | Admitting: Family Medicine

## 2013-10-28 NOTE — Progress Notes (Signed)
Post discharge chart review completed.  

## 2013-11-02 ENCOUNTER — Encounter: Payer: Self-pay | Admitting: Medical

## 2013-11-02 NOTE — Discharge Summary (Signed)
Read both Dr. Whitman HeroJoyner's and CNM Booker's notes  Attestation of Attending Supervision of Advanced Practitioner (CNM/NP): Evaluation and management procedures were performed by the Advanced Practitioner under my supervision and collaboration. I have reviewed the Advanced Practitioner's note and chart, and I agree with the management and plan.  LEGGETT,KELLY H. 6:02 AM

## 2013-11-30 ENCOUNTER — Encounter: Payer: Self-pay | Admitting: *Deleted

## 2013-11-30 ENCOUNTER — Ambulatory Visit: Payer: Medicaid Other | Admitting: Nurse Practitioner

## 2014-06-14 ENCOUNTER — Encounter (HOSPITAL_COMMUNITY): Payer: Self-pay | Admitting: Family Medicine

## 2014-08-26 ENCOUNTER — Encounter (HOSPITAL_COMMUNITY): Payer: Self-pay | Admitting: Obstetrics & Gynecology

## 2015-01-20 ENCOUNTER — Encounter (HOSPITAL_COMMUNITY): Payer: Self-pay | Admitting: Obstetrics & Gynecology

## 2015-03-03 ENCOUNTER — Ambulatory Visit: Payer: Medicaid Other | Admitting: Obstetrics & Gynecology

## 2015-07-28 ENCOUNTER — Encounter (HOSPITAL_COMMUNITY): Payer: Self-pay | Admitting: *Deleted

## 2015-07-28 ENCOUNTER — Inpatient Hospital Stay (HOSPITAL_COMMUNITY)
Admission: AD | Admit: 2015-07-28 | Discharge: 2015-07-29 | Disposition: A | Discharge status: Home / Self Care | Payer: Self-pay | Source: Ambulatory Visit | Attending: Family Medicine | Admitting: Family Medicine

## 2015-07-28 DIAGNOSIS — O26891 Other specified pregnancy related conditions, first trimester: Secondary | ICD-10-CM | POA: Insufficient documentation

## 2015-07-28 DIAGNOSIS — O219 Vomiting of pregnancy, unspecified: Secondary | ICD-10-CM

## 2015-07-28 DIAGNOSIS — R112 Nausea with vomiting, unspecified: Secondary | ICD-10-CM | POA: Insufficient documentation

## 2015-07-28 DIAGNOSIS — Z87891 Personal history of nicotine dependence: Secondary | ICD-10-CM | POA: Insufficient documentation

## 2015-07-28 DIAGNOSIS — Z3A01 Less than 8 weeks gestation of pregnancy: Secondary | ICD-10-CM | POA: Insufficient documentation

## 2015-07-28 DIAGNOSIS — R1010 Upper abdominal pain, unspecified: Secondary | ICD-10-CM | POA: Insufficient documentation

## 2015-07-28 LAB — URINE MICROSCOPIC-ADD ON

## 2015-07-28 LAB — URINALYSIS, ROUTINE W REFLEX MICROSCOPIC
Glucose, UA: NEGATIVE mg/dL
Hgb urine dipstick: NEGATIVE
Leukocytes, UA: NEGATIVE
NITRITE: NEGATIVE
Protein, ur: 30 mg/dL — AB
pH: 6 (ref 5.0–8.0)

## 2015-07-28 LAB — POCT PREGNANCY, URINE: PREG TEST UR: POSITIVE — AB

## 2015-07-28 MED ORDER — LACTATED RINGERS IV BOLUS (SEPSIS)
1000.0000 mL | Freq: Once | INTRAVENOUS | Status: AC
  Administered 2015-07-28: 1000 mL via INTRAVENOUS

## 2015-07-28 MED ORDER — DEXTROSE IN LACTATED RINGERS 5 % IV SOLN
25.0000 mg | Freq: Once | INTRAVENOUS | Status: AC
  Administered 2015-07-28: 25 mg via INTRAVENOUS
  Filled 2015-07-28: qty 1

## 2015-07-28 NOTE — MAU Note (Signed)
PT  SAYS  LMP   WAS 10-31-  NO   . BIRTH  CONTROL.     LAST  SEX-   11-3.    HPT--  NEG  IN  NOV.    VOMITING  STARTED   ON  Monday.   Monday- Tuesday - LOOSE  BM'S.

## 2015-07-28 NOTE — MAU Provider Note (Signed)
History     CSN: [id number]  Arrival date and time: 07/28/15 2026   First Provider Initiated Contact with Patient 07/28/15 2211      Chief Complaint  Patient presents with  . Emesis   HPI Comments: Jasmine Hudson is a 24 y.o. [phone] at [redacted]w[redacted]d who presents today with nausea and vomiting. She states that she started vomiting on Monday. She has not taken anything for it at this time. She denies any lower abdominal pain or vaginal bleeding. Only upper abdominal pain while vomiting.   Emesis  This is a new problem. The current episode started in the past 7 days. The problem occurs 5 to 10 times per day. The problem has been unchanged. The emesis has an appearance of stomach contents. There has been no fever. Associated symptoms include abdominal pain (upper abdominal pain when vomiting. ). Pertinent negatives include no chills, diarrhea or fever. Risk factors: pregnancy  She has tried nothing for the symptoms.    Past Medical History  Diagnosis Date  . Herpes   . UTI in pregnancy 10/2013    currently on antibotics    Past Surgical History  Procedure Laterality Date  . Cesarean section  2007  . Cesarean section  02/29/2012    Procedure: CESAREAN SECTION;  Surgeon: Tilda Burrow, MD;  Location: [facility];  Service: Gynecology;  Laterality: N/A;  Repeat   . Cesarean section N/A 10/23/2013    Procedure: CESAREAN SECTION;  Surgeon: Reva Bores, MD;  Location: [facility];  Service: Obstetrics;  Laterality: N/A;  Small abrasion located on the left lower lateral of the abdomen.  Dermabond applied to the abrasion by Dr. Reola Calkins.  See OR note.    Family History  Problem Relation Age of Onset  . Hypertension Mother     Social History  Substance Use Topics  . Smoking status: Former Smoker    Types: Cigars    Quit date: 08/13/2010  . Smokeless tobacco: Never Used  . Alcohol Use: No    Allergies: No Known Allergies  Prescriptions prior to admission  Medication Sig Dispense Refill Last Dose   . docusate sodium (COLACE) 100 MG capsule Take 1 capsule (100 mg total) by mouth 2 (two) times daily. 10 capsule 0   . ibuprofen (ADVIL,MOTRIN) 600 MG tablet Take 1 tablet (600 mg total) by mouth every 6 (six) hours as needed for mild pain. 30 tablet 0   . oxyCODONE-acetaminophen (PERCOCET/ROXICET) 5-325 MG per tablet Take 1-2 tablets by mouth every 4 (four) hours as needed for severe pain (moderate - severe pain). 30 tablet 0   . Prenatal Vit-Fe Fumarate-FA (PRENATAL MULTIVITAMIN) TABS tablet Take 1 tablet by mouth daily at 12 noon. 30 tablet 0     Review of Systems  Constitutional: Negative for fever and chills.  Gastrointestinal: Positive for nausea, vomiting and abdominal pain (upper abdominal pain when vomiting. ). Negative for diarrhea and constipation.  Genitourinary: Negative for dysuria, urgency and frequency.   Physical Exam   Blood pressure 120/73, pulse 84, temperature 98.9 F (37.2 C), temperature source Oral, resp. rate 20, height  (1.702 m), weight 116.631 kg (257 lb 2 oz), last menstrual period 06/13/2015, unknown if currently breastfeeding.  Physical Exam  Nursing note and vitals reviewed. Constitutional: She is oriented to person, place, and time. She appears well-developed and well-nourished. No distress.  HENT:  Head: Normocephalic.  Cardiovascular: Normal rate.   Respiratory: Effort normal.  GI: Soft. There is no tenderness. There is  no rebound.  Neurological: She is alert and oriented to person, place, and time.  Skin: Skin is warm and dry.  Psychiatric: She has a normal mood and affect.   Results for orders placed or performed during the hospital encounter of 07/28/15 (from the past 24 hour(s))  Urinalysis, Routine w reflex microscopic ([facility])     Status: Abnormal   Collection Time: 07/28/15  8:57 PM  Result Value Ref Range   Color, Urine YELLOW YELLOW   APPearance HAZY (A) CLEAR   Specific Gravity, Urine >1.030 (H) 1.005 - 1.030   pH 6.0 5.0 -  8.0   Glucose, UA NEGATIVE NEGATIVE mg/dL   Hgb urine dipstick NEGATIVE NEGATIVE   Bilirubin Urine SMALL (A) NEGATIVE   Ketones, ur >80 (A) NEGATIVE mg/dL   Protein, ur 30 (A) NEGATIVE mg/dL   Nitrite NEGATIVE NEGATIVE   Leukocytes, UA NEGATIVE NEGATIVE  Urine microscopic-add on     Status: Abnormal   Collection Time: 07/28/15  8:57 PM  Result Value Ref Range   Squamous Epithelial / LPF 6-30 (A) NONE SEEN   WBC, UA 0-5 0 - 5 WBC/hpf   RBC / HPF 0-5 0 - 5 RBC/hpf   Bacteria, UA FEW (A) NONE SEEN   Urine-Other MUCOUS PRESENT   Pregnancy, urine POC     Status: Abnormal   Collection Time: 07/28/15  9:19 PM  Result Value Ref Range   Preg Test, Ur POSITIVE (A) NEGATIVE    MAU Course  Procedures  MDM 2252: Called to the room by the RN. Phenergan started, and pt became very anxious. She felt like she could not breath. O2 sat applied and 97% on RA. Lungs clear. Patient vomited once. Reassured that everything was ok, and patient slowly began to calm down. When I left the room patient comfortable. No longer feeling short of breath, o2 sat 100%, pulse normal. Patient states that she is feeling very overwhelmed and anxious about a 4th baby.  0015: Patient is resting comfortably. No more episodes of anxiety. No vomiting.  0024: IV fluids finished infusing. Patient feeling well. No anxiety, no vomiting.  Assessment and Plan   1. Nausea and vomiting during pregnancy prior to [redacted] weeks gestation    DC home Comfort measures reviewed  1st Trimester precautions  RX: phenergan #30 with 5 RF  Return to MAU as needed   Follow-up Information    Schedule an appointment as soon as possible for a [facility].   Specialty:  Obstetrics and Gynecology   Contact information:   [address] [place] [id number] [phone]        Tawnya CrookHogan, Heather Donovan 07/28/2015, 10:12 PM

## 2015-07-29 DIAGNOSIS — O219 Vomiting of pregnancy, unspecified: Secondary | ICD-10-CM

## 2015-07-29 MED ORDER — PROMETHAZINE HCL 25 MG PO TABS: 12.5000 mg | ORAL_TABLET | Freq: Four times a day (QID) | ORAL | Status: DC | PRN

## 2015-07-29 NOTE — Discharge Instructions (Signed)

## 2015-08-04 ENCOUNTER — Other Ambulatory Visit: Payer: Self-pay | Admitting: Advanced Practice Midwife

## 2015-08-04 MED ORDER — METOCLOPRAMIDE HCL 10 MG PO TABS: 5.0000 mg | ORAL_TABLET | Freq: Four times a day (QID) | ORAL | Status: DC | PRN

## 2015-08-04 MED ORDER — VITAMIN B-6 50 MG PO TABS: 50.0000 mg | ORAL_TABLET | Freq: Two times a day (BID) | ORAL | Status: DC

## 2015-08-04 NOTE — Progress Notes (Signed)
Still vomiting despite Phenergan  Wants Zofran, discussed not given first line due to risk, Rx sent for Reglan and Vit B6  May use Phenergan vaginally  F/U as needed  Aviva SignsMarie L Falisha Osment, CNM

## 2015-08-16 ENCOUNTER — Inpatient Hospital Stay (HOSPITAL_COMMUNITY)
Admission: AD | Admit: 2015-08-16 | Discharge: 2015-08-16 | Disposition: A | Discharge status: Home / Self Care | Payer: Self-pay | Source: Ambulatory Visit | Attending: Obstetrics & Gynecology | Admitting: Obstetrics & Gynecology

## 2015-08-16 ENCOUNTER — Encounter (HOSPITAL_COMMUNITY): Payer: Self-pay | Admitting: Student

## 2015-08-16 DIAGNOSIS — Z87891 Personal history of nicotine dependence: Secondary | ICD-10-CM | POA: Insufficient documentation

## 2015-08-16 DIAGNOSIS — O2311 Infections of bladder in pregnancy, first trimester: Secondary | ICD-10-CM

## 2015-08-16 DIAGNOSIS — N3090 Cystitis, unspecified without hematuria: Secondary | ICD-10-CM

## 2015-08-16 DIAGNOSIS — K117 Disturbances of salivary secretion: Secondary | ICD-10-CM

## 2015-08-16 DIAGNOSIS — Z3A09 9 weeks gestation of pregnancy: Secondary | ICD-10-CM | POA: Insufficient documentation

## 2015-08-16 DIAGNOSIS — O211 Hyperemesis gravidarum with metabolic disturbance: Secondary | ICD-10-CM

## 2015-08-16 DIAGNOSIS — O99611 Diseases of the digestive system complicating pregnancy, first trimester: Secondary | ICD-10-CM

## 2015-08-16 HISTORY — DX: Infections of kidney in pregnancy, unspecified trimester: O23.00

## 2015-08-16 LAB — URINE MICROSCOPIC-ADD ON: RBC / HPF: NONE SEEN RBC/hpf (ref 0–5)

## 2015-08-16 LAB — COMPREHENSIVE METABOLIC PANEL
ALT: 129 U/L — ABNORMAL HIGH (ref 14–54)
AST: 75 U/L — ABNORMAL HIGH (ref 15–41)
Albumin: 4.2 g/dL (ref 3.5–5.0)
Alkaline Phosphatase: 78 U/L (ref 38–126)
Anion gap: 16 — ABNORMAL HIGH (ref 5–15)
BUN: 9 mg/dL (ref 6–20)
CHLORIDE: 99 mmol/L — AB (ref 101–111)
CO2: 20 mmol/L — AB (ref 22–32)
Calcium: 10 mg/dL (ref 8.9–10.3)
Creatinine, Ser: 0.7 mg/dL (ref 0.44–1.00)
Glucose, Bld: 99 mg/dL (ref 65–99)
POTASSIUM: 3.3 mmol/L — AB (ref 3.5–5.1)
SODIUM: 135 mmol/L (ref 135–145)
Total Bilirubin: 2.8 mg/dL — ABNORMAL HIGH (ref 0.3–1.2)
Total Protein: 7.9 g/dL (ref 6.5–8.1)

## 2015-08-16 LAB — URINALYSIS, ROUTINE W REFLEX MICROSCOPIC
GLUCOSE, UA: 100 mg/dL — AB
Nitrite: POSITIVE — AB
PH: 6.5 (ref 5.0–8.0)
Protein, ur: 100 mg/dL — AB
Specific Gravity, Urine: 1.025 (ref 1.005–1.030)

## 2015-08-16 LAB — CBC
HCT: 43.9 % (ref 36.0–46.0)
HEMOGLOBIN: 16 g/dL — AB (ref 12.0–15.0)
MCH: 29.9 pg (ref 26.0–34.0)
MCHC: 36.4 g/dL — AB (ref 30.0–36.0)
MCV: 82.1 fL (ref 78.0–100.0)
PLATELETS: 350 10*3/uL (ref 150–400)
RBC: 5.35 MIL/uL — ABNORMAL HIGH (ref 3.87–5.11)
RDW: 12.6 % (ref 11.5–15.5)
WBC: 8.1 10*3/uL (ref 4.0–10.5)

## 2015-08-16 LAB — LIPASE, BLOOD: Lipase: 80 U/L — ABNORMAL HIGH (ref 11–51)

## 2015-08-16 LAB — AMYLASE: Amylase: 122 U/L — ABNORMAL HIGH (ref 28–100)

## 2015-08-16 MED ORDER — ONDANSETRON HCL 4 MG PO TABS: 4.0000 mg | ORAL_TABLET | Freq: Three times a day (TID) | ORAL | Status: DC | PRN

## 2015-08-16 MED ORDER — PROMETHAZINE HCL 25 MG/ML IJ SOLN: 25.0000 mg | Freq: Once | INTRAMUSCULAR | Status: DC

## 2015-08-16 MED ORDER — GLYCOPYRROLATE 0.2 MG/ML IJ SOLN
0.1000 mg | Freq: Once | INTRAMUSCULAR | Status: AC
  Administered 2015-08-16: 0.1 mg via INTRAVENOUS
  Filled 2015-08-16: qty 0.5

## 2015-08-16 MED ORDER — DEXTROSE IN LACTATED RINGERS 5 % IV SOLN
Freq: Once | INTRAVENOUS | Status: AC
  Administered 2015-08-16: 19:00:00 via INTRAVENOUS
  Filled 2015-08-16: qty 10

## 2015-08-16 MED ORDER — SODIUM CHLORIDE 0.9 % IV SOLN
INTRAVENOUS | Status: DC
  Administered 2015-08-16: 20:00:00 via INTRAVENOUS

## 2015-08-16 MED ORDER — METOCLOPRAMIDE HCL 5 MG/ML IJ SOLN
10.0000 mg | Freq: Once | INTRAMUSCULAR | Status: AC
  Administered 2015-08-16: 10 mg via INTRAVENOUS
  Filled 2015-08-16: qty 2

## 2015-08-16 MED ORDER — FAMOTIDINE IN NACL 20-0.9 MG/50ML-% IV SOLN
20.0000 mg | Freq: Once | INTRAVENOUS | Status: AC
  Administered 2015-08-16: 20 mg via INTRAVENOUS
  Filled 2015-08-16: qty 50

## 2015-08-16 MED ORDER — DEXTROSE 5 % IV SOLN
1.0000 g | Freq: Once | INTRAVENOUS | Status: AC
  Administered 2015-08-16: 1 g via INTRAVENOUS
  Filled 2015-08-16: qty 10

## 2015-08-16 MED ORDER — CEPHALEXIN 500 MG PO CAPS: 500.0000 mg | ORAL_CAPSULE | Freq: Four times a day (QID) | ORAL | Status: DC

## 2015-08-16 MED ORDER — FAMOTIDINE 20 MG PO TABS: 20.0000 mg | ORAL_TABLET | Freq: Two times a day (BID) | ORAL | Status: DC

## 2015-08-16 MED ORDER — PROMETHAZINE HCL 25 MG RE SUPP: 25.0000 mg | Freq: Four times a day (QID) | RECTAL | Status: DC | PRN

## 2015-08-16 MED ORDER — LACTATED RINGERS IV BOLUS (SEPSIS)
1000.0000 mL | Freq: Once | INTRAVENOUS | Status: AC
  Administered 2015-08-16: 1000 mL via INTRAVENOUS

## 2015-08-16 MED ORDER — GLYCOPYRROLATE 1 MG PO TABS: 1.0000 mg | ORAL_TABLET | Freq: Three times a day (TID) | ORAL | Status: DC

## 2015-08-16 NOTE — MAU Provider Note (Signed)
History     CSN: [id number]  Arrival date and time: 08/16/15 4098   First Provider Initiated Contact with Patient 08/16/15 1802         Chief Complaint  Patient presents with  . Emesis During Pregnancy   HPI Jasmine Hudson is a 25 y.o. [phone] at [redacted]w[redacted]d who presents for n/v.  Symptoms began 3 weeks ago. Was seen in MAU and prescribed phenergan & reglan. Stopped taking those medications last week b/c she couldn't keep them down. Tried placing phenergan vaginally without improvement in symptoms. States she hasn't been able to tolerate food or drink in several days.  Some upper abdominal soreness with vomiting and heartburn.  States has trouble urinating but denies dysuria, hematuria, flank pain, or fever.  Denies lower abdominal pain or vaginal bleeding.  Denies diarrhea or constipation.   OB History    Gravida Para Term Preterm AB TAB SAB Ectopic Multiple Living   4 3 3  0      3      Past Medical History  Diagnosis Date  . Herpes   . UTI in pregnancy 10/2013    currently on antibotics    Past Surgical History  Procedure Laterality Date  . Cesarean section  2007  . Cesarean section  02/29/2012    Procedure: CESAREAN SECTION;  Surgeon: Tilda Burrow, MD;  Location: [facility];  Service: Gynecology;  Laterality: N/A;  Repeat   . Cesarean section N/A 10/23/2013    Procedure: CESAREAN SECTION;  Surgeon: Reva Bores, MD;  Location: [facility];  Service: Obstetrics;  Laterality: N/A;  Small abrasion located on the left lower lateral of the abdomen.  Dermabond applied to the abrasion by Dr. Reola Calkins.  See OR note.    Family History  Problem Relation Age of Onset  . Hypertension Mother     Social History  Substance Use Topics  . Smoking status: Former Smoker    Types: Cigars    Quit date: 08/13/2010  . Smokeless tobacco: Never Used  . Alcohol Use: No    Allergies: No Known Allergies  Prescriptions prior to admission  Medication Sig Dispense Refill Last Dose  . metoCLOPramide  (REGLAN) 10 MG tablet Take 0.5 tablets (5 mg total) by mouth every 6 (six) hours as needed for nausea. (Patient not taking: Reported on 08/16/2015) 30 tablet 1 Not Taking at Unknown time  . Prenatal Vit-Fe Fumarate-FA (PRENATAL MULTIVITAMIN) TABS tablet Take 1 tablet by mouth daily at 12 noon. (Patient not taking: Reported on 08/16/2015) 30 tablet 0   . promethazine (PHENERGAN) 25 MG tablet Take 0.5-1 tablets (12.5-25 mg total) by mouth every 6 (six) hours as needed. (Patient not taking: Reported on 08/16/2015) 30 tablet 5 Not Taking at Unknown time  . pyridOXINE (VITAMIN B-6) 50 MG tablet Take 1 tablet (50 mg total) by mouth every 12 (twelve) hours. (Patient not taking: Reported on 08/16/2015) 30 tablet 1     Review of Systems  Constitutional: Negative for fever and chills.  Cardiovascular: Negative.   Gastrointestinal: Positive for heartburn, nausea and vomiting. Negative for abdominal pain, diarrhea and constipation.  Genitourinary: Positive for urgency. Negative for dysuria, hematuria and flank pain.   Physical Exam   Blood pressure 109/62, pulse 86, temperature 98.5 F (36.9 C), temperature source Oral, resp. rate 16, weight 233 lb 6.4 oz (105.87 kg), last menstrual period 06/13/2015, unknown if currently breastfeeding.  Physical Exam  Nursing note and vitals reviewed. Constitutional: She is oriented to person, place, and  time. She appears well-developed and well-nourished. No distress.  HENT:  Head: Normocephalic and atraumatic.  Eyes: Conjunctivae are normal. Right eye exhibits no discharge. Left eye exhibits no discharge. No scleral icterus.  Neck: Normal range of motion.  Cardiovascular: Normal rate, regular rhythm and normal heart sounds.   No murmur heard. Respiratory: Effort normal and breath sounds normal. No respiratory distress. She has no wheezes.  GI: Soft. Bowel sounds are normal. She exhibits no distension. There is tenderness. There is no guarding, no CVA tenderness and  negative Murphy's sign.  Neurological: She is alert and oriented to person, place, and time.  Skin: Skin is warm and dry. She is not diaphoretic.  Psychiatric: She has a normal mood and affect. Her behavior is normal. Judgment and thought content normal.    MAU Course  Procedures Results for orders placed or performed during the hospital encounter of 08/16/15 (from the past 24 hour(s))  Urinalysis, Routine w reflex microscopic (not at [facility])     Status: Abnormal   Collection Time: 08/16/15  5:27 PM  Result Value Ref Range   Color, Urine ORANGE (A) YELLOW   APPearance CLOUDY (A) CLEAR   Specific Gravity, Urine 1.025 1.005 - 1.030   pH 6.5 5.0 - 8.0   Glucose, UA 100 (A) NEGATIVE mg/dL   Hgb urine dipstick TRACE (A) NEGATIVE   Bilirubin Urine LARGE (A) NEGATIVE   Ketones, ur >80 (A) NEGATIVE mg/dL   Protein, ur 409 (A) NEGATIVE mg/dL   Nitrite POSITIVE (A) NEGATIVE   Leukocytes, UA TRACE (A) NEGATIVE  Urine microscopic-add on     Status: Abnormal   Collection Time: 08/16/15  5:27 PM  Result Value Ref Range   Squamous Epithelial / LPF 0-5 (A) NONE SEEN   WBC, UA 0-5 0 - 5 WBC/hpf   RBC / HPF NONE SEEN 0 - 5 RBC/hpf   Bacteria, UA FEW (A) NONE SEEN   Urine-Other MICROSCOPIC EXAM PERFORMED ON UNCONCENTRATED URINE   CBC     Status: Abnormal   Collection Time: 08/16/15  5:52 PM  Result Value Ref Range   WBC 8.1 4.0 - 10.5 K/uL   RBC 5.35 (H) 3.87 - 5.11 MIL/uL   Hemoglobin 16.0 (H) 12.0 - 15.0 g/dL   HCT 81.1 91.4 - 78.2 %   MCV 82.1 78.0 - 100.0 fL   MCH 29.9 26.0 - 34.0 pg   MCHC 36.4 (H) 30.0 - 36.0 g/dL   RDW 95.6 21.3 - 08.6 %   Platelets 350 150 - 400 K/uL  Comprehensive metabolic panel     Status: Abnormal   Collection Time: 08/16/15  5:52 PM  Result Value Ref Range   Sodium 135 135 - 145 mmol/L   Potassium 3.3 (L) 3.5 - 5.1 mmol/L   Chloride 99 (L) 101 - 111 mmol/L   CO2 20 (L) 22 - 32 mmol/L   Glucose, Bld 99 65 - 99 mg/dL   BUN 9 6 - 20 mg/dL   Creatinine, Ser  5.78 0.44 - 1.00 mg/dL   Calcium 46.9 8.9 - 62.9 mg/dL   Total Protein 7.9 6.5 - 8.1 g/dL   Albumin 4.2 3.5 - 5.0 g/dL   AST 75 (H) 15 - 41 U/L   ALT 129 (H) 14 - 54 U/L   Alkaline Phosphatase 78 38 - 126 U/L   Total Bilirubin 2.8 (H) 0.3 - 1.2 mg/dL   GFR calc non Af Amer >60 >60 mL/min   GFR calc Af Amer >60 >60  mL/min   Anion gap 16 (H) 5 - 15  Amylase     Status: Abnormal   Collection Time: 08/16/15  5:52 PM  Result Value Ref Range   Amylase 122 (H) 28 - 100 U/L  Lipase, blood     Status: Abnormal   Collection Time: 08/16/15  5:52 PM  Result Value Ref Range   Lipase 80 (H) 11 - 51 U/L    MDM IV fluids - LR with reglan, pepcid, & robinul MVI in D5LR bolus IV rocephin for UTI 1923-C/w Dr. Debroah LoopArnold. Discussed labs, UTI, pt hx, & weight loss. Rocephin IV in MAU. If pt tolerates PO, can discharge home on zofran & keflex.  Patient able to tolerate ginger ale, apple juice, & crackers Assessment and Plan  A: 1. Hyperemesis gravidarum with dehydration   2. Cystitis during pregnancy in first trimester, antepartum   3. Ptyalism     P: Discharge home Rx phenergan suppositories, zofran, keflex, pepcid, & robinul Discussed reasons to return OB urine culture pending Start prenatal care Discussed hyperemesis diet  Judeth HornErin Jerame Hedding, NP  08/16/2015, 6:02 PM

## 2015-08-16 NOTE — Progress Notes (Signed)
Patient has not vomited since arrival. Spitting.

## 2015-08-16 NOTE — Discharge Instructions (Signed)
[employer] for medication coupons    Eating Plan for Hyperemesis Gravidarum Severe cases of hyperemesis gravidarum can lead to dehydration and malnutrition. The hyperemesis eating plan is one way to lessen the symptoms of nausea and vomiting. It is often used with prescribed medicines to control your symptoms.  WHAT CAN I DO TO RELIEVE MY SYMPTOMS? Listen to your body. Everyone is different and has different preferences. Find what works best for you. Some of the following things may help:  Eat and drink slowly.  Eat 5-6 small meals daily instead of 3 large meals.   Eat crackers before you get out of bed in the morning.   Starchy foods are usually well tolerated (such as cereal, toast, bread, potatoes, pasta, rice, and pretzels).   Ginger may help with nausea. Add  tsp ground ginger to hot tea or choose ginger tea.   Try drinking 100% fruit juice or an electrolyte drink.  Continue to take your prenatal vitamins as directed by your health care provider. If you are having trouble taking your prenatal vitamins, talk with your health care provider about different options.  Include at least 1 serving of protein with your meals and snacks (such as meats or poultry, beans, nuts, eggs, or yogurt). Try eating a protein-rich snack before bed (such as cheese and crackers or a half [place]urkey or peanut butter sandwich). WHAT THINGS SHOULD I AVOID TO REDUCE MY SYMPTOMS? The following things may help reduce your symptoms:  Avoid foods with strong smells. Try eating meals in well-ventilated areas that are free of odors.  Avoid drinking water or other beverages with meals. Try not to drink anything less than 30 minutes before and after meals.  Avoid drinking more than 1 cup of fluid at a time.  Avoid fried or high-fat foods, such as butter and cream sauces.  Avoid spicy foods.  Avoid skipping meals the best you can. Nausea can be more intense on an empty stomach. If you cannot tolerate food at  that time, do not force it. Try sucking on ice chips or other frozen items and make up the calories later.  Avoid lying down within 2 hours after eating.   This information is not intended to replace advice given to you by your health care provider. Make sure you discuss any questions you have with your health care provider.   Document Released: 05/27/2007 Document Revised: 08/04/2013 Document Reviewed: 06/03/2013 Elsevier Interactive Patient Education 2016 Else[employer] Inc.   Pregnancy and Urinary Tract Infection A urinary tract infection (UTI) is a bacterial infection of the urinary tract. Infection of the urinary tract can include the ureters, kidneys (pyelonephritis), bladder (cystitis), and urethra (urethritis). All pregnant women should be screened for bacteria in the urinary tract. Identifying and treating a UTI will decrease the risk of preterm labor and developing more serious infections in both the mother and baby. CAUSES Bacteria germs cause almost all UTIs.  RISK FACTORS Many factors can increase your chances of getting a UTI during pregnancy. These include:  Having a short urethra.  Poor toilet and hygiene habits.  Sexual intercourse.  Blockage of urine along the urinary tract.  Problems with the pelvic muscles or nerves.  Diabetes.  Obesity.  Bladder problems after having several children.  Previous history of UTI. SIGNS AND SYMPTOMS   Pain, burning, or a stinging feeling when urinating.  Suddenly feeling the need to urinate right away (urgency).  Loss of bladder control (urinary incontinence).  Frequent urination, more than is common with  pregnancy.  Lower abdominal or back discomfort.  Cloudy urine.  Blood in the urine (hematuria).  Fever. When the kidneys are infected, the symptoms may be:  Back pain.  Flank pain on the right side more so than the left.  Fever.  Chills.  Nausea.  Vomiting. DIAGNOSIS  A urinary tract infection is usually  diagnosed through urine tests. Additional tests and procedures are sometimes done. These may include:  Ultrasound exam of the kidneys, ureters, bladder, and urethra.  Looking in the bladder with a lighted tube (cystoscopy). TREATMENT Typically, UTIs can be treated with antibiotic medicines.  HOME CARE INSTRUCTIONS   Only take over-the-counter or prescription medicines as directed by your health care provider. If you were prescribed antibiotics, take them as directed. Finish them even if you start to feel better.  Drink enough fluids to keep your urine clear or pale yellow.  Do not have sexual intercourse until the infection is gone and your health care provider says it is okay.  Make sure you are tested for UTIs throughout your pregnancy. These infections often come back. Preventing a UTI in the Future  Practice good toilet habits. Always wipe from front to back. Use the tissue only once.  Do not hold your urine. Empty your bladder as soon as possible when the urge comes.  Do not douche or use deodorant sprays.  Wash with soap and warm water around the genital area and the anus.  Empty your bladder before and after sexual intercourse.  Wear underwear with a cotton crotch.  Avoid caffeine and carbonated drinks. They can irritate the bladder.  Drink cranberry juice or take cranberry pills. This may decrease the risk of getting a UTI.  Do not drink alcohol.  Keep all your appointments and tests as scheduled. SEEK MEDICAL CARE IF:   Your symptoms get worse.  You are still having fevers 2 or more days after treatment begins.  You have a rash.  You feel that you are having problems with medicines prescribed.  You have abnormal vaginal discharge. SEEK IMMEDIATE MEDICAL CARE IF:   You have back or flank pain.  You have chills.  You have blood in your urine.  You have nausea and vomiting.  You have contractions of your uterus.  You have a gush of fluid from the  vagina. MAKE SURE YOU:  Understand these instructions.   Will watch your condition.   Will get help right away if you are not doing well or get worse.    This information is not intended to replace advice given to you by your health care provider. Make sure you discuss any questions you have with your health care provider.   Document Released: 11/24/2010 Document Revised: 05/20/2013 Document Reviewed: 02/26/2013 Elsevier Interactive Patient Education 2016 [employer].   Safe Medications in Pregnancy   Acne: Benzoyl Peroxide Salicylic Acid  Backache/Headache: Tylenol: 2 regular strength every 4 hours OR              2 Extra strength every 6 hours  Colds/Coughs/Allergies: Benadryl (alcohol free) 25 mg every 6 hours as needed Breath right strips Claritin Cepacol throat lozenges Chloraseptic throat spray Cold-Eeze- up to three times per day Cough drops, alcohol free Flonase (by prescription only) Guaifenesin Mucinex Robitussin DM (plain only, alcohol free) Saline nasal spray/drops Sudafed (pseudoephedrine) & Actifed ** use only after [redacted] weeks gestation and if you do not have high blood pressure Tylenol Vicks Vaporub Zinc lozenges Zyrtec   Constipation: Colace Ducolax suppositories  Fleet enema Glycerin suppositories Metamucil Milk of magnesia Miralax Senokot Smooth move tea  Diarrhea: Kaopectate Imodium A-D  *NO pepto Bismol  Hemorrhoids: Anusol Anusol HC Preparation H Tucks  Indigestion: Tums Maalox Mylanta Zantac  Pepcid  Insomnia: Benadryl (alcohol free) 25mg  every 6 hours as needed Tylenol PM Unisom, no Gelcaps  Leg Cramps: Tums MagGel  Nausea/Vomiting:  Bonine Dramamine Emetrol Ginger extract Sea bands Meclizine  Nausea medication to take during pregnancy:  Unisom (doxylamine succinate 25 mg tablets) Take one tablet daily at bedtime. If symptoms are not adequately controlled, the dose can be increased to a maximum  recommended dose of two tablets daily (1/2 tablet in the morning, 1/2 tablet mid-afternoon and one at bedtime). Vitamin B6 100mg  tablets. Take one tablet twice a day (up to 200 mg per day).  Skin Rashes: Aveeno products Benadryl cream or 25mg  every 6 hours as needed Calamine Lotion 1% cortisone cream  Yeast infection: Gyne-lotrimin 7 Monistat 7  Gum/tooth pain: Anbesol  **If taking multiple medications, please check labels to avoid duplicating the same active ingredients **take medication as directed on the label ** Do not exceed 4000 mg of tylenol in 24 hours **Do not take medications that contain aspirin or ibuprofen

## 2015-08-16 NOTE — MAU Note (Signed)
States has not been able to keep anything down since she was here last. States medication is not working.

## 2015-08-18 LAB — CULTURE, OB URINE

## 2016-06-01 ENCOUNTER — Encounter (HOSPITAL_COMMUNITY): Payer: Self-pay

## 2016-07-18 ENCOUNTER — Encounter (HOSPITAL_COMMUNITY): Payer: Self-pay

## 2016-07-18 ENCOUNTER — Inpatient Hospital Stay (HOSPITAL_COMMUNITY)
Admission: AD | Admit: 2016-07-18 | Discharge: 2016-07-18 | Disposition: A | Discharge status: Home / Self Care | Payer: Self-pay | Source: Ambulatory Visit | Attending: Family Medicine | Admitting: Family Medicine

## 2016-07-18 DIAGNOSIS — Z3202 Encounter for pregnancy test, result negative: Secondary | ICD-10-CM | POA: Insufficient documentation

## 2016-07-18 DIAGNOSIS — B9689 Other specified bacterial agents as the cause of diseases classified elsewhere: Secondary | ICD-10-CM | POA: Insufficient documentation

## 2016-07-18 DIAGNOSIS — N760 Acute vaginitis: Secondary | ICD-10-CM | POA: Insufficient documentation

## 2016-07-18 DIAGNOSIS — Z87891 Personal history of nicotine dependence: Secondary | ICD-10-CM | POA: Insufficient documentation

## 2016-07-18 DIAGNOSIS — R110 Nausea: Secondary | ICD-10-CM | POA: Insufficient documentation

## 2016-07-18 LAB — WET PREP, GENITAL
Sperm: NONE SEEN
Trich, Wet Prep: NONE SEEN
YEAST WET PREP: NONE SEEN

## 2016-07-18 LAB — URINALYSIS, ROUTINE W REFLEX MICROSCOPIC
BILIRUBIN URINE: NEGATIVE
Glucose, UA: NEGATIVE mg/dL
Hgb urine dipstick: NEGATIVE
KETONES UR: NEGATIVE mg/dL
LEUKOCYTES UA: NEGATIVE
Nitrite: NEGATIVE
PROTEIN: 30 mg/dL — AB
SPECIFIC GRAVITY, URINE: 1.025 (ref 1.005–1.030)
pH: 7 (ref 5.0–8.0)

## 2016-07-18 LAB — POCT PREGNANCY, URINE: PREG TEST UR: NEGATIVE

## 2016-07-18 MED ORDER — METRONIDAZOLE 500 MG PO TABS: 500.0000 mg | ORAL_TABLET | Freq: Two times a day (BID) | ORAL | 0 refills | Status: DC

## 2016-07-18 NOTE — Discharge Instructions (Signed)

## 2016-07-18 NOTE — MAU Provider Note (Signed)
History     CSN: [id number]  Arrival date and time: 07/18/16 1511   First Provider Initiated Contact with Patient 07/18/16 1541      Chief Complaint  Patient presents with  . Nausea   HPI   [name] is a 25 y.o. female [phone] here in MAU with nausea, and slight back pain. This pain feels the same as when she had a kidney infection/ UTI.  She is also concerned that she may be pregnant due to the nausea symptoms.   She denies fever.   OB History    Gravida Para Term Preterm AB Living   4 3 3  0 1 3   SAB TAB Ectopic Multiple Live Births   0 1     3      Past Medical History:  Diagnosis Date  . Herpes   . Pyelonephritis 2014  . Pyelonephritis affecting pregnancy 2013  . UTI in pregnancy 10/2013    Past Surgical History:  Procedure Laterality Date  . CESAREAN SECTION  2007  . CESAREAN SECTION  02/29/2012   Procedure: CESAREAN SECTION;  Surgeon: Tilda BurrowJohn V Ferguson, MD;  Location: [facility];  Service: Gynecology;  Laterality: N/A;  Repeat   . CESAREAN SECTION N/A 10/23/2013   Procedure: CESAREAN SECTION;  Surgeon: Reva Boresanya S Pratt, MD;  Location: [facility];  Service: Obstetrics;  Laterality: N/A;  Small abrasion located on the left lower lateral of the abdomen.  Dermabond applied to the abrasion by Dr. Reola CalkinsBeck.  See OR note.    Family History  Problem Relation Age of Onset  . Hypertension Mother     Social History  Substance Use Topics  . Smoking status: Former Smoker    Types: Cigars    Quit date: 08/13/2010  . Smokeless tobacco: Never Used  . Alcohol use No    Allergies: No Known Allergies  Prescriptions Prior to Admission  Medication Sig Dispense Refill Last Dose  . cephALEXin (KEFLEX) 500 MG capsule Take 1 capsule (500 mg total) by mouth 4 (four) times daily. 28 capsule 0   . famotidine (PEPCID) 20 MG tablet Take 1 tablet (20 mg total) by mouth 2 (two) times daily. 60 tablet 0   . glycopyrrolate (ROBINUL) 1 MG tablet Take 1 tablet (1 mg total) by mouth 3 (three)  times daily. 15 tablet 0   . metoCLOPramide (REGLAN) 10 MG tablet Take 0.5 tablets (5 mg total) by mouth every 6 (six) hours as needed for nausea. (Patient not taking: Reported on 08/16/2015) 30 tablet 1 Not Taking at Unknown time  . ondansetron (ZOFRAN) 4 MG tablet Take 1 tablet (4 mg total) by mouth every 8 (eight) hours as needed for nausea or vomiting. 30 tablet 0   . Prenatal Vit-Fe Fumarate-FA (PRENATAL MULTIVITAMIN) TABS tablet Take 1 tablet by mouth daily at 12 noon. (Patient not taking: Reported on 08/16/2015) 30 tablet 0   . promethazine (PHENERGAN) 25 MG suppository Place 1 suppository (25 mg total) rectally every 6 (six) hours as needed for nausea or vomiting. 10 each 0   . promethazine (PHENERGAN) 25 MG tablet Take 0.5-1 tablets (12.5-25 mg total) by mouth every 6 (six) hours as needed. (Patient not taking: Reported on 08/16/2015) 30 tablet 5 Not Taking at Unknown time  . pyridOXINE (VITAMIN B-6) 50 MG tablet Take 1 tablet (50 mg total) by mouth every 12 (twelve) hours. (Patient not taking: Reported on 08/16/2015) 30 tablet 1    Results for orders placed or performed during the  hospital encounter of 07/18/16 (from the past 48 hour(s))  Urinalysis, Routine w reflex microscopic     Status: Abnormal   Collection Time: 07/18/16  3:25 PM  Result Value Ref Range   Color, Urine YELLOW YELLOW   APPearance HAZY (A) CLEAR   Specific Gravity, Urine 1.025 1.005 - 1.030   pH 7.0 5.0 - 8.0   Glucose, UA NEGATIVE NEGATIVE mg/dL   Hgb urine dipstick NEGATIVE NEGATIVE   Bilirubin Urine NEGATIVE NEGATIVE   Ketones, ur NEGATIVE NEGATIVE mg/dL   Protein, ur 30 (A) NEGATIVE mg/dL   Nitrite NEGATIVE NEGATIVE   Leukocytes, UA NEGATIVE NEGATIVE   RBC / HPF 0-5 0 - 5 RBC/hpf   WBC, UA 0-5 0 - 5 WBC/hpf   Bacteria, UA RARE (A) NONE SEEN   Squamous Epithelial / LPF 6-30 (A) NONE SEEN   Mucous PRESENT   Pregnancy, urine POC     Status: None   Collection Time: 07/18/16  3:30 PM  Result Value Ref Range   Preg  Test, Ur NEGATIVE NEGATIVE    Comment:        THE SENSITIVITY OF THIS METHODOLOGY IS >24 mIU/mL   Wet prep, genital     Status: Abnormal   Collection Time: 07/18/16  3:51 PM  Result Value Ref Range   Yeast Wet Prep HPF POC NONE SEEN NONE SEEN   Trich, Wet Prep NONE SEEN NONE SEEN   Clue Cells Wet Prep HPF POC PRESENT (A) NONE SEEN   WBC, Wet Prep HPF POC FEW (A) NONE SEEN    Comment: MODERATE BACTERIA SEEN   Sperm NONE SEEN     Review of Systems  Constitutional: Negative for chills and fever.  Gastrointestinal: Positive for abdominal pain (Lower abdominal cramping. ) and nausea.   Physical Exam   Blood pressure 138/96, pulse 85, temperature 98 F (36.7 C), temperature source Oral, resp. rate 18, unknown if currently breastfeeding.  Physical Exam  Constitutional: She is oriented to person, place, and time. She appears well-developed and well-nourished. No distress.  HENT:  Head: Normocephalic.  Eyes: Pupils are equal, round, and reactive to light.  Respiratory: Effort normal.  GI: Soft. Normal appearance. There is tenderness in the suprapubic area. There is no rigidity, no guarding and no CVA tenderness.  Genitourinary:  Genitourinary Comments: Wet prep and GCC collected by RN.   Musculoskeletal: Normal range of motion.  Neurological: She is alert and oriented to person, place, and time.  Skin: Skin is warm. She is not diaphoretic.  Psychiatric: Her behavior is normal.    MAU Course  Procedures  None  MDM  UA without sign of UTI. Patient is without CVA tenderness. Patient was highly concerned about pregnancy due to nausea. Pregnancy test here Is negative.   Assessment and Plan    A:  1. Encounter for pregnancy test with result negative   2. Nausea   3. Bacterial vaginosis     P:  Discharge home in stable condition Rx: Flagyl- no alcohol Return to MAU if symptoms worsen for emergencies  At home pregnancy tests encouraged  Duane LopeJennifer I Jalaysha Skilton,  NP 07/18/2016 5:23 PM

## 2016-07-18 NOTE — MAU Note (Signed)
Patient presents with c/o N/V and chills. Patient states that she think she might have a urine infection because she had sex and she always gets a UTI when she has sex. She also states that her back is hurting.

## 2016-07-19 LAB — HIV ANTIBODY (ROUTINE TESTING W REFLEX): HIV SCREEN 4TH GENERATION: NONREACTIVE

## 2016-07-20 LAB — GC/CHLAMYDIA PROBE AMP (~~LOC~~) NOT AT ARMC
Chlamydia: NEGATIVE
NEISSERIA GONORRHEA: NEGATIVE

## 2017-02-28 ENCOUNTER — Inpatient Hospital Stay (HOSPITAL_COMMUNITY)
Admission: AD | Admit: 2017-02-28 | Discharge: 2017-02-28 | Disposition: A | Discharge status: Home / Self Care | Payer: Self-pay | Source: Ambulatory Visit | Attending: Obstetrics and Gynecology | Admitting: Obstetrics and Gynecology

## 2017-02-28 ENCOUNTER — Encounter (HOSPITAL_COMMUNITY): Payer: Self-pay | Admitting: *Deleted

## 2017-02-28 ENCOUNTER — Inpatient Hospital Stay (HOSPITAL_COMMUNITY): Payer: Self-pay

## 2017-02-28 DIAGNOSIS — Z3A01 Less than 8 weeks gestation of pregnancy: Secondary | ICD-10-CM | POA: Insufficient documentation

## 2017-02-28 DIAGNOSIS — O209 Hemorrhage in early pregnancy, unspecified: Secondary | ICD-10-CM

## 2017-02-28 DIAGNOSIS — Z87891 Personal history of nicotine dependence: Secondary | ICD-10-CM | POA: Insufficient documentation

## 2017-02-28 DIAGNOSIS — O4691 Antepartum hemorrhage, unspecified, first trimester: Secondary | ICD-10-CM | POA: Insufficient documentation

## 2017-02-28 LAB — POCT PREGNANCY, URINE: PREG TEST UR: POSITIVE — AB

## 2017-02-28 LAB — WET PREP, GENITAL
Clue Cells Wet Prep HPF POC: NONE SEEN
Sperm: NONE SEEN
Trich, Wet Prep: NONE SEEN
YEAST WET PREP: NONE SEEN

## 2017-02-28 LAB — URINALYSIS, ROUTINE W REFLEX MICROSCOPIC
BILIRUBIN URINE: NEGATIVE
Glucose, UA: NEGATIVE mg/dL
KETONES UR: NEGATIVE mg/dL
Leukocytes, UA: NEGATIVE
NITRITE: NEGATIVE
PROTEIN: 100 mg/dL — AB
pH: 5.5 (ref 5.0–8.0)

## 2017-02-28 LAB — URINALYSIS, MICROSCOPIC (REFLEX)

## 2017-02-28 LAB — CBC
HEMATOCRIT: 39.9 % (ref 36.0–46.0)
HEMOGLOBIN: 13.1 g/dL (ref 12.0–15.0)
MCH: 28.9 pg (ref 26.0–34.0)
MCHC: 32.8 g/dL (ref 30.0–36.0)
MCV: 87.9 fL (ref 78.0–100.0)
PLATELETS: 347 10*3/uL (ref 150–400)
RBC: 4.54 MIL/uL (ref 3.87–5.11)
RDW: 14 % (ref 11.5–15.5)
WBC: 7.3 10*3/uL (ref 4.0–10.5)

## 2017-02-28 LAB — HCG, QUANTITATIVE, PREGNANCY: hCG, Beta Chain, Quant, S: 498 m[IU]/mL — ABNORMAL HIGH (ref <5)

## 2017-02-28 NOTE — MAU Note (Signed)
Pt woke up this morning bleeding, had pos HPT 3 weeks ago.  Denies pain.  States the bleeding is similar to a period.

## 2017-02-28 NOTE — Discharge Instructions (Signed)
Vaginal Bleeding During Pregnancy, First Trimester °A small amount of bleeding (spotting) from the vagina is common in early pregnancy. Sometimes the bleeding is normal and is not a problem, and sometimes it is a sign of something serious. Be sure to tell your doctor about any bleeding from your vagina right away. °Follow these instructions at home: °· Watch your condition for any changes. °· Follow your doctor's instructions about how active you can be. °· If you are on bed rest: °? You may need to stay in bed and only get up to use the bathroom. °? You may be allowed to do some activities. °? If you need help, make plans for someone to help you. °· Write down: °? The number of pads you use each day. °? How often you change pads. °? How soaked (saturated) your pads are. °· Do not use tampons. °· Do not douche. °· Do not have sex or orgasms until your doctor says it is okay. °· If you pass any tissue from your vagina, save the tissue so you can show it to your doctor. °· Only take medicines as told by your doctor. °· Do not take aspirin because it can make you bleed. °· Keep all follow-up visits as told by your doctor. °Contact a doctor if: °· You bleed from your vagina. °· You have cramps. °· You have labor pains. °· You have a fever that does not go away after you take medicine. °Get help right away if: °· You have very bad cramps in your back or belly (abdomen). °· You pass large clots or tissue from your vagina. °· You bleed more. °· You feel light-headed or weak. °· You pass out (faint). °· You have chills. °· You are leaking fluid or have a gush of fluid from your vagina. °· You pass out while pooping (having a bowel movement). °This information is not intended to replace advice given to you by your health care provider. Make sure you discuss any questions you have with your health care provider. °Document Released: 12/14/2013 Document Revised: 01/05/2016 Document Reviewed: 04/06/2013 °Elsevier Interactive  Patient Education © 2018 [employer] Inc. ° °

## 2017-02-28 NOTE — MAU Provider Note (Signed)
Chief Complaint: Vaginal Bleeding     Provider saw patient at 0815am    SUBJECTIVE HPI: Jasmine Hudson is a 26 y.o. [id number] at [redacted]w[redacted]d by LMP who presents to maternity admissions reporting bleeding which started this morning.  No pain.  Worried it is a miscarriage. She denies vaginal bleeding, vaginal itching/burning, urinary symptoms, h/a, dizziness, n/v, or fever/chills.    Vaginal Bleeding  The patient's primary symptoms include missed menses and vaginal bleeding. The patient's pertinent negatives include no genital itching, genital lesions, genital odor or pelvic pain. This is a new problem. The current episode started today. The problem occurs constantly. The problem has been unchanged. The patient is experiencing no pain. She is pregnant. Pertinent negatives include no abdominal pain, back pain, chills, constipation, diarrhea, dysuria, fever, headaches, nausea or vomiting. The vaginal discharge was bloody. The vaginal bleeding is typical of menses. She has not been passing clots. She has not been passing tissue. Nothing aggravates the symptoms. She has tried nothing for the symptoms.    RN Note: Pt woke up this morning bleeding, had pos HPT 3 weeks ago.  Denies pain.  States the bleeding is similar to a period.  Past Medical History:  Diagnosis Date  . Herpes   . Pyelonephritis 2014  . Pyelonephritis affecting pregnancy 2013  . UTI in pregnancy 10/2013   Past Surgical History:  Procedure Laterality Date  . CESAREAN SECTION  2007  . CESAREAN SECTION  02/29/2012   Procedure: CESAREAN SECTION;  Surgeon: Tilda Burrow, MD;  Location: [facility];  Service: Gynecology;  Laterality: N/A;  Repeat   . CESAREAN SECTION N/A 10/23/2013   Procedure: CESAREAN SECTION;  Surgeon: Reva Bores, MD;  Location: [facility];  Service: Obstetrics;  Laterality: N/A;  Small abrasion located on the left lower lateral of the abdomen.  Dermabond applied to the abrasion by Dr. Reola Calkins.  See OR note.   Social History    Social History  . Marital status: Single    Spouse name: N/A  . Number of children: N/A  . Years of education: N/A   Occupational History  . Not on file.   Social History Main Topics  . Smoking status: Former Smoker    Types: Cigars    Quit date: 08/13/2010  . Smokeless tobacco: Never Used  . Alcohol use No  . Drug use: No  . Sexual activity: Yes    Birth control/ protection: None     Comment: pregnant   Other Topics Concern  . Not on file   Social History Narrative  . No narrative on file   No current facility-administered medications on file prior to encounter.    Current Outpatient Prescriptions on File Prior to Encounter  Medication Sig Dispense Refill  . metroNIDAZOLE (FLAGYL) 500 MG tablet Take 1 tablet (500 mg total) by mouth 2 (two) times daily. 14 tablet 0   No Known Allergies  I have reviewed patient's Past Medical Hx, Surgical Hx, Family Hx, Social Hx, medications and allergies.   ROS:  Review of Systems  Constitutional: Negative for chills and fever.  Gastrointestinal: Negative for abdominal pain, constipation, diarrhea, nausea and vomiting.  Genitourinary: Positive for missed menses and vaginal bleeding. Negative for dysuria and pelvic pain.  Musculoskeletal: Negative for back pain.  Neurological: Negative for headaches.   Review of Systems  Other systems negative   Physical Exam  Physical Exam Patient Vitals for the past 24 hrs:  BP Temp Temp src Pulse Resp Height Weight  02/28/17 0749 130/81 97.8 F (36.6 C) Oral 83 18 5\' 7"  (1.702 m) 277 lb 1.9 oz (125.7 kg)   Constitutional: Well-developed, well-nourished female in no acute distress.  Cardiovascular: normal rate Respiratory: normal effort GI: Abd soft, non-tender. Pos BS x 4 MS: Extremities nontender, no edema, normal ROM Neurologic: Alert and oriented x 4.  GU: Neg CVAT.  PELVIC EXAM: Cervix pink, visually closed, without lesion, small amount of bloody discharge, vaginal walls and  external genitalia normal Bimanual exam: Cervix 0/long/high, firm, anterior, neg CMT, uterus nontender, nonenlarged, adnexa without tenderness, enlargement, or mass  LAB RESULTS Results for orders placed or performed during the hospital encounter of 02/28/17 (from the past 24 hour(s))  Urinalysis, Routine w reflex microscopic     Status: Abnormal   Collection Time: 02/28/17  7:30 AM  Result Value Ref Range   Color, Urine YELLOW YELLOW   APPearance CLEAR CLEAR   Specific Gravity, Urine >1.030 (H) 1.005 - 1.030   pH 5.5 5.0 - 8.0   Glucose, UA NEGATIVE NEGATIVE mg/dL   Hgb urine dipstick LARGE (A) NEGATIVE   Bilirubin Urine NEGATIVE NEGATIVE   Ketones, ur NEGATIVE NEGATIVE mg/dL   Protein, ur 161 (A) NEGATIVE mg/dL   Nitrite NEGATIVE NEGATIVE   Leukocytes, UA NEGATIVE NEGATIVE  Urinalysis, Microscopic (reflex)     Status: Abnormal   Collection Time: 02/28/17  7:30 AM  Result Value Ref Range   RBC / HPF 6-30 0 - 5 RBC/hpf   WBC, UA 0-5 0 - 5 WBC/hpf   Bacteria, UA RARE (A) NONE SEEN   Squamous Epithelial / LPF 0-5 (A) NONE SEEN  Pregnancy, urine POC     Status: Abnormal   Collection Time: 02/28/17  7:56 AM  Result Value Ref Range   Preg Test, Ur POSITIVE (A) NEGATIVE  Wet prep, genital     Status: Abnormal   Collection Time: 02/28/17  8:13 AM  Result Value Ref Range   Yeast Wet Prep HPF POC NONE SEEN NONE SEEN   Trich, Wet Prep NONE SEEN NONE SEEN   Clue Cells Wet Prep HPF POC NONE SEEN NONE SEEN   WBC, Wet Prep HPF POC FEW (A) NONE SEEN   Sperm NONE SEEN   hCG, quantitative, pregnancy     Status: Abnormal   Collection Time: 02/28/17  8:19 AM  Result Value Ref Range   hCG, Beta Chain, Quant, S 498 (H) <5 mIU/mL  CBC     Status: None   Collection Time: 02/28/17  8:19 AM  Result Value Ref Range   WBC 7.3 4.0 - 10.5 K/uL   RBC 4.54 3.87 - 5.11 MIL/uL   Hemoglobin 13.1 12.0 - 15.0 g/dL   HCT 09.6 04.5 - 40.9 %   MCV 87.9 78.0 - 100.0 fL   MCH 28.9 26.0 - 34.0 pg   MCHC  32.8 30.0 - 36.0 g/dL   RDW 81.1 91.4 - 78.2 %   Platelets 347 150 - 400 K/uL       IMAGING US Ob Comp Less 14 Wks  Result Date: 02/28/2017 CLINICAL DATA:  Bleeding in early pregnancy beginning this morning. Gestational age by LMP of 6 weeks 1 day. EXAM: OBSTETRIC <14 WK [place] AND TRANSVAGINAL OB US TECHNIQUE: Both transabdominal and transvaginal ultrasound examinations were performed for complete evaluation of the gestation as well as the maternal uterus, adnexal regions, and pelvic cul-de-sac. Transvaginal technique was performed to assess early pregnancy. COMPARISON:  None. FINDINGS: Intrauterine gestational sac: Single Yolk sac:  Visualized. Embryo:  Visualized. Cardiac Activity: Visualized. Heart Rate: 86  bpm CRL:  2  mm   5 w   5 d                  US EDC: 10/26/2016 Subchorionic hemorrhage:  None visualized. Maternal uterus/adnexae: Retroflexed uterus. Normal appearance of both ovaries. No mass or abnormal free fluid identified. IMPRESSION: Single living IUP measuring 5 weeks 5 days, with US EDC of 10/26/2016. This is concordant with LMP. No significant maternal uterine or adnexal abnormality identified. Electronically Signed   By: Myles RosenthalJohn  Stahl M.D.   On: 02/28/2017 10:00   [place] Ob Transvaginal  Result Date: 02/28/2017 CLINICAL DATA:  Bleeding in early pregnancy beginning this morning. Gestational age by LMP of 6 weeks 1 day. EXAM: OBSTETRIC <14 WK US AND TRANSVAGINAL OB US TECHNIQUE: Both transabdominal and transvaginal ultrasound examinations were performed for complete evaluation of the gestation as well as the maternal uterus, adnexal regions, and pelvic cul-de-sac. Transvaginal technique was performed to assess early pregnancy. COMPARISON:  None. FINDINGS: Intrauterine gestational sac: Single Yolk sac:  Visualized. Embryo:  Visualized. Cardiac Activity: Visualized. Heart Rate: 86  bpm CRL:  2  mm   5 w   5 d                  US EDC: 10/26/2016 Subchorionic hemorrhage:  None visualized. Maternal  uterus/adnexae: Retroflexed uterus. Normal appearance of both ovaries. No mass or abnormal free fluid identified. IMPRESSION: Single living IUP measuring 5 weeks 5 days, with US EDC of 10/26/2016. This is concordant with LMP. No significant maternal uterine or adnexal abnormality identified. Electronically Signed   By: Myles RosenthalJohn  Stahl M.D.   On: 02/28/2017 10:00    MAU Management/MDM: Ordered usual first trimester r/o ectopic labs.   Pelvic exam and cultures done Will check baseline Ultrasound to rule out ectopic.  This bleeding/pain can represent a normal pregnancy with bleeding, spontaneous abortion or even an ectopic which can be life-threatening.  The process as listed above helps to determine which of these is present.  Lab results reviewed Single IUP with no identified source of bleeding. Possibly implantation bleed Discussed with patient   ASSESSMENT 1. Bleeding in early pregnancy   2. Bleeding in early pregnancy     PLAN Discharge home Pelvic rest Encouraged to seek prenatal care  Pt stable at time of discharge. Encouraged to return here or to other Urgent Care/ED if she develops worsening of symptoms, increase in pain, fever, or other concerning symptoms.    Wynelle BourgeoisMarie Kerstin Crusoe CNM, MSN Certified Nurse-Midwife 02/28/2017  8:20 AM

## 2017-03-01 ENCOUNTER — Inpatient Hospital Stay (HOSPITAL_COMMUNITY): Payer: Self-pay

## 2017-03-01 ENCOUNTER — Inpatient Hospital Stay (HOSPITAL_COMMUNITY)
Admission: AD | Admit: 2017-03-01 | Discharge: 2017-03-01 | Disposition: A | Discharge status: Home / Self Care | Payer: Self-pay | Source: Ambulatory Visit | Attending: Obstetrics & Gynecology | Admitting: Obstetrics & Gynecology

## 2017-03-01 DIAGNOSIS — O039 Complete or unspecified spontaneous abortion without complication: Secondary | ICD-10-CM | POA: Insufficient documentation

## 2017-03-01 DIAGNOSIS — Z87891 Personal history of nicotine dependence: Secondary | ICD-10-CM | POA: Insufficient documentation

## 2017-03-01 DIAGNOSIS — Z3A01 Less than 8 weeks gestation of pregnancy: Secondary | ICD-10-CM | POA: Insufficient documentation

## 2017-03-01 DIAGNOSIS — O209 Hemorrhage in early pregnancy, unspecified: Secondary | ICD-10-CM

## 2017-03-01 LAB — CBC WITH DIFFERENTIAL/PLATELET
BASOS ABS: 0 10*3/uL (ref 0.0–0.1)
BASOS PCT: 0 %
EOS PCT: 1 %
Eosinophils Absolute: 0.1 10*3/uL (ref 0.0–0.7)
HEMATOCRIT: 39.5 % (ref 36.0–46.0)
Hemoglobin: 13.3 g/dL (ref 12.0–15.0)
Lymphocytes Relative: 37 %
Lymphs Abs: 3.5 10*3/uL (ref 0.7–4.0)
MCH: 29.5 pg (ref 26.0–34.0)
MCHC: 33.7 g/dL (ref 30.0–36.0)
MCV: 87.6 fL (ref 78.0–100.0)
MONO ABS: 0.4 10*3/uL (ref 0.1–1.0)
MONOS PCT: 4 %
NEUTROS ABS: 5.4 10*3/uL (ref 1.7–7.7)
Neutrophils Relative %: 58 %
PLATELETS: 364 10*3/uL (ref 150–400)
RBC: 4.51 MIL/uL (ref 3.87–5.11)
RDW: 14 % (ref 11.5–15.5)
WBC: 9.4 10*3/uL (ref 4.0–10.5)

## 2017-03-01 LAB — HIV ANTIBODY (ROUTINE TESTING W REFLEX): HIV SCREEN 4TH GENERATION: NONREACTIVE

## 2017-03-01 LAB — GC/CHLAMYDIA PROBE AMP (~~LOC~~) NOT AT ARMC
Chlamydia: NEGATIVE
NEISSERIA GONORRHEA: NEGATIVE

## 2017-03-01 NOTE — MAU Note (Signed)
Was seen yesterday for vaginal bleeding, bleeding is heavier passing clots, feels like her period. Wants to know if she is having a miscarriage.

## 2017-03-01 NOTE — MAU Provider Note (Signed)
History     CSN: [id number]  Arrival date and time: 03/01/17 1302   None     Chief Complaint  Patient presents with  . Vaginal Bleeding   HPI Jasmine Hudson is 26 y.o. [phone] [redacted]w[redacted]d weeks presenting with increased vaginal bleeding since yesterday's visit to MAU.  + for mild pelvic discomfort rating as 1-2/10.  Yesterday BHCG 498, U/S viable IUP with FHR 86.    Past Medical History:  Diagnosis Date  . Herpes   . Pyelonephritis 2014  . Pyelonephritis affecting pregnancy 2013  . UTI in pregnancy 10/2013    Past Surgical History:  Procedure Laterality Date  . CESAREAN SECTION  2007  . CESAREAN SECTION  02/29/2012   Procedure: CESAREAN SECTION;  Surgeon: Tilda Burrow, MD;  Location: [facility];  Service: Gynecology;  Laterality: N/A;  Repeat   . CESAREAN SECTION N/A 10/23/2013   Procedure: CESAREAN SECTION;  Surgeon: Reva Bores, MD;  Location: [facility];  Service: Obstetrics;  Laterality: N/A;  Small abrasion located on the left lower lateral of the abdomen.  Dermabond applied to the abrasion by Dr. Reola Calkins.  See OR note.    Family History  Problem Relation Age of Onset  . Hypertension Mother     Social History  Substance Use Topics  . Smoking status: Former Smoker    Types: Cigars    Quit date: 08/13/2010  . Smokeless tobacco: Never Used  . Alcohol use No    Allergies: No Known Allergies  No prescriptions prior to admission.    Review of Systems  Constitutional: Negative for fever.       Tearful concerned for miscarriage.  Respiratory: Negative for shortness of breath.   Cardiovascular: Negative for chest pain.  Genitourinary: Positive for pelvic pain (mild discomfort ) and vaginal bleeding ("like a period"  ).  Neurological: Negative for dizziness and headaches.   Physical Exam   Blood pressure 133/77, temperature 98.2 F (36.8 C), resp. rate 18, height 5\' 7"  (1.702 m), weight 275 lb (124.7 kg), last menstrual period 01/16/2017, unknown if currently  breastfeeding.  Physical Exam  Constitutional: She is oriented to person, place, and time. She appears well-developed and well-nourished. No distress.  tearful  HENT:  Head: Normocephalic.  Neck: Normal range of motion.  Respiratory: Effort normal.  GI: There is tenderness (right lower quadrant tenderness on exam).  Genitourinary: There is no rash, tenderness or lesion on the right labia. There is no rash, tenderness or lesion on the left labia. Uterus is enlarged (slightly enlarged). Uterus is not tender. Cervix exhibits no discharge. Right adnexum displays tenderness. Right adnexum displays no mass and no fullness. Left adnexum displays no mass, no tenderness and no fullness. There is bleeding (small amount of dark red blood with tissue.  Neg for clot) in the vagina.  Neurological: She is alert and oriented to person, place, and time.  Skin: Skin is warm and dry. She is not diaphoretic.  Psychiatric: She has a normal mood and affect. Her behavior is normal. Thought content normal.   BLOOD TYPE per previousrecord A Positive  Results for orders placed or performed during the hospital encounter of 03/01/17 (from the past 24 hour(s))  CBC with Differential/Platelet     Status: None   Collection Time: 03/01/17  3:45 PM  Result Value Ref Range   WBC 9.4 4.0 - 10.5 K/uL   RBC 4.51 3.87 - 5.11 MIL/uL   Hemoglobin 13.3 12.0 - 15.0 g/dL   HCT  39.5 36.0 - 46.0 %   MCV 87.6 78.0 - 100.0 fL   MCH 29.5 26.0 - 34.0 pg   MCHC 33.7 30.0 - 36.0 g/dL   RDW 40.114.0 02.711.5 - 25.315.5 %   Platelets 364 150 - 400 K/uL   Neutrophils Relative % 58 %   Neutro Abs 5.4 1.7 - 7.7 K/uL   Lymphocytes Relative 37 %   Lymphs Abs 3.5 0.7 - 4.0 K/uL   Monocytes Relative 4 %   Monocytes Absolute 0.4 0.1 - 1.0 K/uL   Eosinophils Relative 1 %   Eosinophils Absolute 0.1 0.0 - 0.7 K/uL   Basophils Relative 0 %   Basophils Absolute 0.0 0.0 - 0.1 K/uL    [place] Ob Transvaginal  Result Date: 03/01/2017 CLINICAL DATA:  Vaginal  bleeding in first trimester pregnancy increased bleeding since yesterday, passing clots EXAM: TRANSVAGINAL OB ULTRASOUND TECHNIQUE: Transvaginal ultrasound was performed for complete evaluation of the gestation as well as the maternal uterus, adnexal regions, and pelvic cul-de-sac. COMPARISON:  02/28/2017 FINDINGS: Intrauterine gestational sac: Present, now located in the mid uterine segment previously upper uterine segment Yolk sac:  Not definitely visualized Embryo: Not definitely visualized - irregular material within gestational sac noted Cardiac Activity: Absent Heart Rate: N/A bpm MSD: 6.5  mm   5 w   2  d Subchorionic hemorrhage:  None visualized. Maternal uterus/adnexae: RIGHT ovary normal size and morphology 2.1 x 2.2 x 3.1 cm. LEFT ovary normal size and morphology 3.4 x 1.8 x 2.2 cm. Trace free pelvic fluid. No adnexal masses IMPRESSION: Gestational sac is again visualized but is now located at the mid uterus. Irregular material is seen within the gestational sac but the fetal pole and yolk sac seen on the previous exam are not clearly identified. No fetal cardiac activity is detected, though was detected previously. Findings are consistent with a nonviable pregnancy. Electronically Signed   By: Ulyses SouthwardMark  Boles M.D.   On: 03/01/2017 15:47   MAU Course  Procedures  GC/CHL culture collected yesterday pending  MDM MSE Exam Labs U/S Tissue to pathology  Assessment and Plan  A;  Vaginal bleeding at 2231w2d gestation       Spontaneous Abortion       Stable hemoglobin  P:  Discussed lab and U/S findings with Jasmine Hudson        Follow up in the Clinic was requested      Return for worsening bleeding or pain       May take Ibuprofen if cramping Eve M Katalyna Socarras 03/01/2017, 4:24 PM

## 2017-03-01 NOTE — Discharge Instructions (Signed)

## 2017-03-25 ENCOUNTER — Encounter: Payer: Self-pay | Admitting: Family Medicine

## 2017-03-25 ENCOUNTER — Ambulatory Visit (INDEPENDENT_AMBULATORY_CARE_PROVIDER_SITE_OTHER): Payer: Self-pay | Admitting: Family Medicine

## 2017-03-25 ENCOUNTER — Ambulatory Visit: Payer: Self-pay | Admitting: Clinical

## 2017-03-25 ENCOUNTER — Ambulatory Visit: Payer: Self-pay | Admitting: Advanced Practice Midwife

## 2017-03-25 ENCOUNTER — Telehealth: Payer: Self-pay | Admitting: *Deleted

## 2017-03-25 VITALS — BP 125/78 | HR 86 | Ht 67.0 in | Wt 274.7 lb

## 2017-03-25 DIAGNOSIS — F4323 Adjustment disorder with mixed anxiety and depressed mood: Secondary | ICD-10-CM

## 2017-03-25 DIAGNOSIS — O021 Missed abortion: Secondary | ICD-10-CM

## 2017-03-25 MED ORDER — MISOPROSTOL 200 MCG PO TABS: 600.0000 ug | ORAL_TABLET | Freq: Once | ORAL | 1 refills | Status: DC

## 2017-03-25 NOTE — Addendum Note (Signed)
Addended by: Faythe CasaBELLAMY, JEANETTA M on: 03/25/2017 03:28 PM   Modules accepted: Orders

## 2017-03-25 NOTE — BH Specialist Note (Signed)
Integrated Behavioral Health Initial Visit  MRN: [id number] Name: Jasmine Hudson   Session Start time: 2:05 Session End time: 2:19 Total time: 15 minutes  Type of Service: Integrated Behavioral Health- Individual/Family Interpretor:No. Interpretor Name and Language: n/a   Warm Hand Off Completed.       SUBJECTIVE: Jasmine EnsignKaalea T Strahm is a 26 y.o. female accompanied by patient. Patient was referred by Dr Adrian BlackwaterStinson for depression and anxiety after miscarriage. Patient reports the following symptoms/concerns: Pt states her primary concern today is a need to find housing in one week; feeling overwhelmed with life stress, feelings, and lack of quality sleep after recent miscarriage. Pt open to learn self-coping strategy for stress, and community resources to help find housing. Duration of problem: About two weeks; Severity of problem: severe  OBJECTIVE: Mood: Depressed and Affect: Depressed Risk of harm to self or others: No plan to harm self or others   LIFE CONTEXT: Family and Social: Lives with mother, mother's children, and her own  children, ages 3811, 715, and 3.  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); aspires to attend law school  Self-Care: sometimes meditation for self-care; sleep difficulty last two weeks Life Changes: Miscarriage; has to find housing in one week  GOALS ADDRESSED: Patient will reduce symptoms of: anxiety, depression and stress and increase knowledge and/or ability of: stress reduction and also: Increase healthy adjustment to current life circumstances and Begin healthy grieving over loss   INTERVENTIONS: Mindfulness or Management consultantelaxation Training, Psychoeducation and/or Health Education and Link to [employer]  Standardized Assessments completed: GAD-7 and PHQ 9  ASSESSMENT: Patient currently experiencing Adjustment disorder with mixed anxious and depressed mood. Patient may benefit from psychoeducation and brief therapeutic intervention  regarding coping with symptoms of anxiety and depression.  PLAN: 1. Follow up with behavioral health clinician on : As needed[facility] 2. Behavioral recommendations:  -CALM relaxation breathing exercise daily -Consider [employer] [facility] for housing support  -Consider [employer] for additional support at www.heartstringssupport.org  -Read educational material regarding coping with symptoms of anxiety and depression 3. Referral(s): Integrated Art gallery managerBehavioral Health Services (In Clinic) and [employer] Resources:  Housing and [employer] 4. "From scale of 1-10, how likely are you to follow plan?": 8  Rae LipsJamie C Aline Wesche, LCSWA   Depression screen [facility] 2/9 03/25/2017  Decreased Interest 3  Down, Depressed, Hopeless 3  PHQ - 2 Score 6  Altered sleeping 3  Tired, decreased energy 2  Change in appetite 2  Feeling bad or failure about yourself  3  Trouble concentrating 2  Moving slowly or fidgety/restless 0  Suicidal thoughts 0  PHQ-9 Score 18   GAD 7 : Generalized Anxiety Score 03/25/2017  Nervous, Anxious, on Edge 3  Control/stop worrying 3  Worry too much - different things 3  Trouble relaxing 3  Restless 1  Easily annoyed or irritable 3  Afraid - awful might happen 2  Total GAD 7 Score 18

## 2017-03-25 NOTE — Progress Notes (Signed)
   Subjective:    Patient ID: Jasmine EnsignKaalea T Hudson, female    DOB: Mar 16, 1991, 26 y.o.   MRN: [id number]  HPI Patient seen in MAU 3 weeks ago. Had vaginal bleeding and US with no cardiac activity after seeing an US with cardiac activity. Has passed no tissue. Last quant was 498.   Review of Systems     Objective:   Physical Exam  Constitutional: She appears well-developed and well-nourished.  Cardiovascular: Normal rate and regular rhythm.   Pulmonary/Chest: Effort normal and breath sounds normal. No respiratory distress. She has no wheezes. She has no rales.  Abdominal: Soft. She exhibits no distension. There is no tenderness. There is no rebound and no guarding.  Skin: Skin is warm and dry.  Psychiatric: She has a normal mood and affect. Her behavior is normal. Judgment and thought content normal.       Assessment & Plan:  1. Missed abortion Cytotec given. Will get quant. F/u in 2 weeks. - B-HCG Quant

## 2017-03-25 NOTE — Progress Notes (Signed)
Patient here for GYN visit. Patient complains of still having bleeding after miscarriage 3 weeks ago.

## 2017-03-25 NOTE — Telephone Encounter (Signed)
Jasmine Hudson missed her scheduled follow up after miscarriage- I called her and she states she is having car trouble and has been trying to call us to change appt.  I attempted to transfer to front desk , but line busy. Informed Jasmine BernheimKaalea I will take message to front office to call her back and change appt,she may call back if needed.

## 2017-03-26 LAB — BETA HCG QUANT (REF LAB)

## 2017-03-27 ENCOUNTER — Inpatient Hospital Stay (HOSPITAL_COMMUNITY)
Admission: AD | Admit: 2017-03-27 | Discharge: 2017-03-27 | Disposition: A | Discharge status: Home / Self Care | Payer: Self-pay | Source: Ambulatory Visit | Attending: Family Medicine | Admitting: Family Medicine

## 2017-03-27 ENCOUNTER — Inpatient Hospital Stay (HOSPITAL_COMMUNITY): Payer: Self-pay

## 2017-03-27 ENCOUNTER — Encounter (HOSPITAL_COMMUNITY): Payer: Self-pay | Admitting: *Deleted

## 2017-03-27 ENCOUNTER — Telehealth (HOSPITAL_COMMUNITY): Payer: Self-pay | Admitting: *Deleted

## 2017-03-27 DIAGNOSIS — O039 Complete or unspecified spontaneous abortion without complication: Secondary | ICD-10-CM

## 2017-03-27 DIAGNOSIS — M545 Low back pain: Secondary | ICD-10-CM

## 2017-03-27 DIAGNOSIS — R109 Unspecified abdominal pain: Secondary | ICD-10-CM

## 2017-03-27 DIAGNOSIS — O26899 Other specified pregnancy related conditions, unspecified trimester: Secondary | ICD-10-CM

## 2017-03-27 HISTORY — DX: Chronic kidney disease, unspecified: N18.9

## 2017-03-27 LAB — URINALYSIS, ROUTINE W REFLEX MICROSCOPIC
Bilirubin Urine: NEGATIVE
GLUCOSE, UA: NEGATIVE mg/dL
HGB URINE DIPSTICK: NEGATIVE
KETONES UR: NEGATIVE mg/dL
LEUKOCYTES UA: NEGATIVE
Nitrite: NEGATIVE
PH: 6 (ref 5.0–8.0)
PROTEIN: NEGATIVE mg/dL
Specific Gravity, Urine: 1.004 — ABNORMAL LOW (ref 1.005–1.030)

## 2017-03-27 NOTE — MAU Note (Addendum)
Dx with MAB on Monday.  Was given Cytotec on Monday after leaving the hospital.  Nothing has happened yet.  No bleeding or pain.  Pt had written down back pain, states this is NOT a new problem. Has had back pain and been vomiting since found out preg.  Pt wanting D&C, feels it needs to be delivered.

## 2017-03-27 NOTE — MAU Note (Signed)
Pt concerned back pain may be from a UTI. Will send spec.

## 2017-03-27 NOTE — MAU Provider Note (Signed)
History     CSN: [id number]  Arrival date and time: 03/27/17 1526   First Provider Initiated Contact with Patient 03/27/17 1613      Chief Complaint  Patient presents with  . Back Pain  . Emesis   HPI 26 yo [id number] diagnosed with a missed abortion on 7/20 presenting today for further evaluation. Patient was medically treated with misoprostol on 8/13 for presumed missed ab which she took buccally in the afternoon of 8/13. She denies any vaginal bleeding or cramping pain. She still feels pregnant and is requesting a D&C immediately  OB History    Gravida Para Term Preterm AB Living   5 3 3  0 1 3   SAB TAB Ectopic Multiple Live Births   0 1     3      Past Medical History:  Diagnosis Date  . Chronic kidney disease   . Herpes   . Infection    UTI  . Pyelonephritis 2014  . Pyelonephritis affecting pregnancy 2013  . UTI in pregnancy 10/2013    Past Surgical History:  Procedure Laterality Date  . CESAREAN SECTION  2007  . CESAREAN SECTION  02/29/2012   Procedure: CESAREAN SECTION;  Surgeon: Tilda BurrowJohn V Ferguson, MD;  Location: [facility];  Service: Gynecology;  Laterality: N/A;  Repeat   . CESAREAN SECTION N/A 10/23/2013   Procedure: CESAREAN SECTION;  Surgeon: Reva Boresanya S Pratt, MD;  Location: [facility];  Service: Obstetrics;  Laterality: N/A;  Small abrasion located on the left lower lateral of the abdomen.  Dermabond applied to the abrasion by Dr. Reola CalkinsBeck.  See OR note.  . INDUCED ABORTION      Family History  Problem Relation Age of Onset  . Hypertension Mother   . Hypertension Maternal Grandfather     Social History  Substance Use Topics  . Smoking status: Former Smoker    Types: Cigars    Quit date: 08/13/2010  . Smokeless tobacco: Never Used  . Alcohol use Yes     Comment: ocassionally    Allergies: No Known Allergies  Prescriptions Prior to Admission  Medication Sig Dispense Refill Last Dose  . misoprostol (CYTOTEC) 200 MCG tablet Take 3 tablets (600 mcg total) by mouth  once. 3 tablet 1     Review of Systems  See pertinent in HPI Physical Exam   Blood pressure 133/81, pulse 74, temperature 98.2 F (36.8 C), temperature source Oral, resp. rate 16, last menstrual period 01/16/2017, unknown if currently breastfeeding.  Physical Exam GENERAL: Well-developed, well-nourished female in no acute distress.  ABDOMEN: Soft, nontender, nondistended. Obese PELVIC: Not performed EXTREMITIES: No cyanosis, clubbing, or edema, 2+ distal pulses.  MAU Course  Procedures  MDM [place] Transvaginal Non-ob  Result Date: 03/27/2017 CLINICAL DATA:  Back pain.  Recent missed abortion. EXAM: TRANSABDOMINAL AND TRANSVAGINAL ULTRASOUND OF PELVIS TECHNIQUE: Both transabdominal and transvaginal ultrasound examinations of the pelvis were performed. Transabdominal technique was performed for global imaging of the pelvis including uterus, ovaries, adnexal regions, and pelvic cul-de-sac. It was necessary to proceed with endovaginal exam following the transabdominal exam to visualize the endometrium. COMPARISON:  03/01/2017 FINDINGS: Uterus Measurements: 8.7 x 4.5 x 7.0 cm. No fibroids or other mass visualized. Endometrium Thickness: 7.6 mm.  No focal abnormality visualized. Right ovary Measurements: 3 x 2.8 x 2.6 cm. Normal appearance/no adnexal mass. Left ovary Measurements: 3.3 x 1.9 x 1.9 cm. Normal appearance/no adnexal mass. Other findings Small volume of free fluid identified within the pelvis. IMPRESSION: 1. No  findings to suggest retained products of conception. 2. Small volume of free fluid noted within the pelvis. Electronically Signed   By: Signa Kell M.D.   On: 03/27/2017 17:23    Assessment and Plan  26 yo [id number] with complete miscarriage - Ultrasound results reviewed with the patient - Reassurance provided  Devery Murgia 03/27/2017, 5:28 PM

## 2017-03-28 ENCOUNTER — Other Ambulatory Visit: Payer: Self-pay | Admitting: Family Medicine

## 2017-03-28 MED ORDER — NORGESTIMATE-ETH ESTRADIOL 0.25-35 MG-MCG PO TABS: 1.0000 | ORAL_TABLET | Freq: Every day | ORAL | 11 refills | Status: DC

## 2017-03-28 NOTE — Progress Notes (Signed)
Discussed results with patient - hcg normal. Was in MAU yesterday - US negative for retained POC. Not bleeding currently. Will start on OCPs.

## 2017-04-04 ENCOUNTER — Ambulatory Visit: Payer: Self-pay | Admitting: Obstetrics and Gynecology

## 2017-04-10 NOTE — Telephone Encounter (Signed)
Left HIPPA-compliant message to return call to Ailynn Gow at [facility] at [phone].      

## 2017-04-11 ENCOUNTER — Encounter: Payer: Self-pay | Admitting: *Deleted

## 2017-04-11 ENCOUNTER — Ambulatory Visit: Payer: Self-pay | Admitting: Obstetrics & Gynecology

## 2017-04-11 NOTE — Progress Notes (Signed)
Pt did not keep appt today. Discussed with Dr. Macon LargeAnyanwu she does not need us to call patient to reschedule.

## 2017-05-29 ENCOUNTER — Inpatient Hospital Stay (HOSPITAL_COMMUNITY)
Admission: AD | Admit: 2017-05-29 | Discharge: 2017-05-29 | Disposition: A | Discharge status: Home / Self Care | Payer: Self-pay | Source: Ambulatory Visit | Attending: Obstetrics and Gynecology | Admitting: Obstetrics and Gynecology

## 2017-05-29 ENCOUNTER — Encounter (HOSPITAL_COMMUNITY): Payer: Self-pay

## 2017-05-29 DIAGNOSIS — O219 Vomiting of pregnancy, unspecified: Secondary | ICD-10-CM

## 2017-05-29 DIAGNOSIS — Z3201 Encounter for pregnancy test, result positive: Secondary | ICD-10-CM

## 2017-05-29 DIAGNOSIS — Z87891 Personal history of nicotine dependence: Secondary | ICD-10-CM | POA: Insufficient documentation

## 2017-05-29 DIAGNOSIS — O2341 Unspecified infection of urinary tract in pregnancy, first trimester: Secondary | ICD-10-CM | POA: Insufficient documentation

## 2017-05-29 DIAGNOSIS — Z3A01 Less than 8 weeks gestation of pregnancy: Secondary | ICD-10-CM | POA: Insufficient documentation

## 2017-05-29 DIAGNOSIS — O210 Mild hyperemesis gravidarum: Secondary | ICD-10-CM | POA: Insufficient documentation

## 2017-05-29 LAB — URINALYSIS, ROUTINE W REFLEX MICROSCOPIC
Bilirubin Urine: NEGATIVE
GLUCOSE, UA: NEGATIVE mg/dL
Hgb urine dipstick: NEGATIVE
KETONES UR: 20 mg/dL — AB
Leukocytes, UA: NEGATIVE
Nitrite: POSITIVE — AB
PH: 6 (ref 5.0–8.0)
Protein, ur: NEGATIVE mg/dL
Specific Gravity, Urine: 1.008 (ref 1.005–1.030)

## 2017-05-29 LAB — POCT PREGNANCY, URINE: Preg Test, Ur: NEGATIVE

## 2017-05-29 LAB — HCG, SERUM, QUALITATIVE: PREG SERUM: POSITIVE — AB

## 2017-05-29 MED ORDER — PROMETHAZINE HCL 25 MG PO TABS: 25.0000 mg | ORAL_TABLET | Freq: Four times a day (QID) | ORAL | 0 refills | Status: DC | PRN

## 2017-05-29 MED ORDER — CEPHALEXIN 500 MG PO CAPS: 500.0000 mg | ORAL_CAPSULE | Freq: Four times a day (QID) | ORAL | 0 refills | Status: DC

## 2017-05-29 NOTE — Discharge Instructions (Signed)
Morning Sickness Morning sickness is when you feel sick to your stomach (nauseous) during pregnancy. This nauseous feeling may or may not come with vomiting. It often occurs in the morning but can be a problem any time of day. Morning sickness is most common during the first trimester, but it may continue throughout pregnancy. While morning sickness is unpleasant, it is usually harmless unless you develop severe and continual vomiting (hyperemesis gravidarum). This condition requires more intense treatment. What are the causes? The cause of morning sickness is not completely known but seems to be related to normal hormonal changes that occur in pregnancy. What increases the risk? You are at greater risk if you:  Experienced nausea or vomiting before your pregnancy.  Had morning sickness during a previous pregnancy.  Are pregnant with more than one baby, such as twins.  How is this treated? Do not use any medicines (prescription, over-the-counter, or herbal) for morning sickness without first talking to your health care provider. Your health care provider may prescribe or recommend:  Vitamin B6 supplements.  Anti-nausea medicines.  The herbal medicine ginger.  Follow these instructions at home:  Only take over-the-counter or prescription medicines as directed by your health care provider.  Taking multivitamins before getting pregnant can prevent or decrease the severity of morning sickness in most women.  Eat a piece of dry toast or unsalted crackers before getting out of bed in the morning.  Eat five or six small meals a day.  Eat dry and bland foods (rice, baked potato). Foods high in carbohydrates are often helpful.  Do not drink liquids with your meals. Drink liquids between meals.  Avoid greasy, fatty, and spicy foods.  Get someone to cook for you if the smell of any food causes nausea and vomiting.  If you feel nauseous after taking prenatal vitamins, take the vitamins at  night or with a snack.  Snack on protein foods (nuts, yogurt, cheese) between meals if you are hungry.  Eat unsweetened gelatins for desserts.  Wearing an acupressure wristband (worn for sea sickness) may be helpful.  Acupuncture may be helpful.  Do not smoke.  Get a humidifier to keep the air in your house free of odors.  Get plenty of fresh air. Contact a health care provider if:  Your home remedies are not working, and you need medicine.  You feel dizzy or lightheaded.  You are losing weight. Get help right away if:  You have persistent and uncontrolled nausea and vomiting.  You pass out (faint). This information is not intended to replace advice given to you by your health care provider. Make sure you discuss any questions you have with your health care provider. Document Released: 09/20/2006 Document Revised: 01/05/2016 Document Reviewed: 01/14/2013 Elsevier Interactive Patient Education  2017 [employer]  Pregnancy and Urinary Tract Infection What is a urinary tract infection? A urinary tract infection (UTI) is an infection of any part of the urinary tract. This includes the kidneys, the tubes that connect your kidneys to your bladder (ureters), the bladder, and the tube that carries urine out of your body (urethra). These organs make, store, and get rid of urine in the body. A UTI can be a bladder infection (cystitis) or a kidney infection (pyelonephritis). This infection may be caused by fungi, viruses, and bacteria. Bacteria are the most common cause of UTIs. You are more likely to develop a UTI during pregnancy because:  The physical and hormonal changes your body goes through can make it easier  for bacteria to get into your urinary tract.  Your growing baby puts pressure on your uterus and can affect urine flow.  Does a UTI place my baby at risk? An untreated UTI during pregnancy could lead to a kidney infection, which can cause health problems that could affect  your baby. Possible complications of an untreated UTI include:  Having your baby before 37 weeks of pregnancy (premature).  Having a baby with a low birth weight.  Developing high blood pressure during pregnancy (preeclampsia).  What are the symptoms of a UTI? Symptoms of a UTI include:  Fever.  Frequent urination or passing small amounts of urine frequently.  Needing to urinate urgently.  Pain or a burning sensation with urination.  Urine that smells bad or unusual.  Cloudy urine.  Pain in the lower abdomen or back.  Trouble urinating.  Blood in the urine.  Vomiting or being less hungry than normal.  Diarrhea or abdominal pain.  Vaginal discharge.  What are the treatment options for a UTI during pregnancy? Treatment for this condition may include:  Antibiotic medicines that are safe to take during pregnancy.  Other medicines to treat less common causes of UTI.  How can I prevent a UTI?  To prevent a UTI:  Go to the bathroom as soon as you feel the need.  Always wipe from front to back.  Wash your genital area with soap and warm water daily.  Empty your bladder before and after sex.  Wear cotton underwear.  Limit your intake of high sugar foods or drinks, such as regular soda, juice, and sweets.  Drink 6-8 glasses of water daily.  Do not wear tight-fitting pants.  Do not douche or use deodorant sprays.  Do not drink alcohol, caffeine, or carbonated drinks. These can irritate the bladder.  Contact a health care provider if:  Your symptoms do not improve or get worse.  You have a fever after two days of treatment.  You have a rash.  You have abnormal vaginal discharge.  You have back or side pain.  You have chills.  You have nausea and vomiting. Get help right away if: Seek immediate medical care if you are pregnant and:  You feel contractions in your uterus.  You have lower belly pain.  You have a gush of fluid from your  vagina.  You have blood in your urine.  You are vomiting and cannot keep down any medicines or water.  This information is not intended to replace advice given to you by your health care provider. Make sure you discuss any questions you have with your health care provider. Document Released: 11/24/2010 Document Revised: 07/13/2016 Document Reviewed: 06/20/2015 Elsevier Interactive Patient Education  2017 [employer].

## 2017-05-29 NOTE — MAU Note (Signed)
Pt reports some nausea x 1-2 weeks. Pt denies vomiting. States she had a faintly +upt at home today. Pt denies vaginal bleeding or pain. LMP: 05/09/2017

## 2017-05-29 NOTE — MAU Provider Note (Signed)
History     CSN: [id number]  Arrival date and time: 05/29/17 1845   First Provider Initiated Contact with Patient 05/29/17 2050      Chief Complaint  Patient presents with  . Possible Pregnancy  . Nausea   HPI Jasmine Hudson is a 26 y.o. [id number] at 102w6d by LMP who presents with nausea & dysuria. Reports nausea for the last 2 weeks. Denies vomiting. Has faint positive HPT earlier today. Denies diarrhea/constipation, fever/chills, abdominal pain, flank pain, hematuria, or vaginal bleeding. Endorses mild dysuria for the last 2 days. Hx of UTIs & pyelo. Last UTI was 6 months ago.   OB History    Gravida Para Term Preterm AB Living   5 3 3  0 1 3   SAB TAB Ectopic Multiple Live Births   0 1     3      Past Medical History:  Diagnosis Date  . Chronic kidney disease   . Herpes   . Pyelonephritis 2014  . Pyelonephritis affecting pregnancy 2013  . UTI in pregnancy 10/2013    Past Surgical History:  Procedure Laterality Date  . CESAREAN SECTION  2007  . CESAREAN SECTION  02/29/2012   Procedure: CESAREAN SECTION;  Surgeon: Jasmine Burrow, MD;  Location: [facility];  Service: Gynecology;  Laterality: N/A;  Repeat   . CESAREAN SECTION N/A 10/23/2013   Procedure: CESAREAN SECTION;  Surgeon: Jasmine Bores, MD;  Location: [facility];  Service: Obstetrics;  Laterality: N/A;  Small abrasion located on the left lower lateral of the abdomen.  Dermabond applied to the abrasion by Dr. Reola Hudson.  See OR note.  . INDUCED ABORTION      Family History  Problem Relation Age of Onset  . Hypertension Mother   . Hypertension Maternal Grandfather     Social History  Substance Use Topics  . Smoking status: Former Smoker    Types: Cigars    Quit date: 08/13/2010  . Smokeless tobacco: Never Used  . Alcohol use Yes     Comment: ocassionally    Allergies: No Known Allergies  Prescriptions Prior to Admission  Medication Sig Dispense Refill Last Dose  . norgestimate-ethinyl estradiol  (ORTHO-CYCLEN,SPRINTEC,PREVIFEM) 0.25-35 MG-MCG tablet Take 1 tablet by mouth daily. 1 Package 11     Review of Systems  Constitutional: Negative for chills and fatigue.  Gastrointestinal: Positive for nausea. Negative for abdominal pain, diarrhea and vomiting.  Genitourinary: Positive for dysuria. Negative for flank pain, frequency, hematuria, vaginal bleeding and vaginal discharge.   Physical Exam   Blood pressure 95/60, pulse 79, temperature 97.8 F (36.6 C), temperature source Oral, resp. rate 18, height 5\' 7"  (1.702 m), weight 267 lb (121.1 kg), last menstrual period 05/09/2017, SpO2 100 %, unknown if currently breastfeeding.  Physical Exam  Nursing note and vitals reviewed. Constitutional: She is oriented to person, place, and time. She appears well-developed and well-nourished. No distress.  HENT:  Head: Normocephalic and atraumatic.  Eyes: Conjunctivae are normal. Right eye exhibits no discharge. Left eye exhibits no discharge. No scleral icterus.  Neck: Normal range of motion.  Respiratory: Effort normal. No respiratory distress.  GI: There is no CVA tenderness.  Neurological: She is alert and oriented to person, place, and time.  Skin: Skin is warm and dry. She is not diaphoretic.  Psychiatric: She has a normal mood and affect. Her behavior is normal. Judgment and thought content normal.    MAU Course  Procedures Results for orders placed or performed during the  hospital encounter of 05/29/17 (from the past 24 hour(s))  Urinalysis, Routine w reflex microscopic     Status: Abnormal   Collection Time: 05/29/17  7:37 PM  Result Value Ref Range   Color, Urine YELLOW YELLOW   APPearance CLEAR CLEAR   Specific Gravity, Urine 1.008 1.005 - 1.030   pH 6.0 5.0 - 8.0   Glucose, UA NEGATIVE NEGATIVE mg/dL   Hgb urine dipstick NEGATIVE NEGATIVE   Bilirubin Urine NEGATIVE NEGATIVE   Ketones, ur 20 (A) NEGATIVE mg/dL   Protein, ur NEGATIVE NEGATIVE mg/dL   Nitrite POSITIVE (A)  NEGATIVE   Leukocytes, UA NEGATIVE NEGATIVE   RBC / HPF 0-5 0 - 5 RBC/hpf   WBC, UA 0-5 0 - 5 WBC/hpf   Bacteria, UA RARE (A) NONE SEEN   Squamous Epithelial / LPF 0-5 (A) NONE SEEN   Mucus PRESENT   Pregnancy, urine POC     Status: None   Collection Time: 05/29/17  7:44 PM  Result Value Ref Range   Preg Test, Ur NEGATIVE NEGATIVE  hCG, serum, qualitative     Status: Abnormal   Collection Time: 05/29/17  8:09 PM  Result Value Ref Range   Preg, Serum WEAK POSITIVE (A) NEGATIVE    MDM Negative UPT. HCG quantitative "faint positive" Denies abdominal pain or vaginal bleeding U/a with + nitrites. Patient reports mild dysuria. No fever, hematuria, or flank pain. Will tx for uti & send urine for culture.  VSS, NAD. No CVAT.   Assessment and Plan  A: 1. Positive pregnancy test   2. Urinary tract infection in mother during first trimester of pregnancy   3. Nausea and vomiting during pregnancy prior to [redacted] weeks gestation    P: Discharge home Rx phenergan & keflex Discussed reasons to return to MAU Start prenatal care Urine culture pending   Jasmine Hudson 05/29/2017, 8:51 PM

## 2017-06-01 LAB — CULTURE, OB URINE

## 2017-06-18 ENCOUNTER — Inpatient Hospital Stay (HOSPITAL_COMMUNITY)
Admission: AD | Admit: 2017-06-18 | Discharge: 2017-06-18 | Disposition: A | Discharge status: Home / Self Care | Payer: Self-pay | Source: Ambulatory Visit | Attending: Obstetrics & Gynecology | Admitting: Obstetrics & Gynecology

## 2017-06-18 ENCOUNTER — Inpatient Hospital Stay (HOSPITAL_COMMUNITY): Payer: Self-pay

## 2017-06-18 ENCOUNTER — Encounter (HOSPITAL_COMMUNITY): Payer: Self-pay | Admitting: *Deleted

## 2017-06-18 DIAGNOSIS — R102 Pelvic and perineal pain: Secondary | ICD-10-CM | POA: Insufficient documentation

## 2017-06-18 DIAGNOSIS — Z87891 Personal history of nicotine dependence: Secondary | ICD-10-CM | POA: Insufficient documentation

## 2017-06-18 DIAGNOSIS — O26831 Pregnancy related renal disease, first trimester: Secondary | ICD-10-CM | POA: Insufficient documentation

## 2017-06-18 DIAGNOSIS — Z3490 Encounter for supervision of normal pregnancy, unspecified, unspecified trimester: Secondary | ICD-10-CM

## 2017-06-18 DIAGNOSIS — Z79899 Other long term (current) drug therapy: Secondary | ICD-10-CM | POA: Insufficient documentation

## 2017-06-18 DIAGNOSIS — N189 Chronic kidney disease, unspecified: Secondary | ICD-10-CM | POA: Insufficient documentation

## 2017-06-18 DIAGNOSIS — O26891 Other specified pregnancy related conditions, first trimester: Secondary | ICD-10-CM | POA: Insufficient documentation

## 2017-06-18 DIAGNOSIS — O209 Hemorrhage in early pregnancy, unspecified: Secondary | ICD-10-CM | POA: Insufficient documentation

## 2017-06-18 DIAGNOSIS — Z3A01 Less than 8 weeks gestation of pregnancy: Secondary | ICD-10-CM | POA: Insufficient documentation

## 2017-06-18 DIAGNOSIS — O219 Vomiting of pregnancy, unspecified: Secondary | ICD-10-CM | POA: Insufficient documentation

## 2017-06-18 LAB — URINALYSIS, ROUTINE W REFLEX MICROSCOPIC
Bilirubin Urine: NEGATIVE
GLUCOSE, UA: NEGATIVE mg/dL
Hgb urine dipstick: NEGATIVE
KETONES UR: 5 mg/dL — AB
Nitrite: NEGATIVE
PROTEIN: 30 mg/dL — AB
Specific Gravity, Urine: 1.018 (ref 1.005–1.030)
pH: 7 (ref 5.0–8.0)

## 2017-06-18 LAB — CBC
HCT: 35.4 % — ABNORMAL LOW (ref 36.0–46.0)
HEMOGLOBIN: 11.8 g/dL — AB (ref 12.0–15.0)
MCH: 29.1 pg (ref 26.0–34.0)
MCHC: 33.3 g/dL (ref 30.0–36.0)
MCV: 87.2 fL (ref 78.0–100.0)
Platelets: 319 10*3/uL (ref 150–400)
RBC: 4.06 MIL/uL (ref 3.87–5.11)
RDW: 14.4 % (ref 11.5–15.5)
WBC: 10 10*3/uL (ref 4.0–10.5)

## 2017-06-18 LAB — RAPID URINE DRUG SCREEN, HOSP PERFORMED
AMPHETAMINES: NOT DETECTED
Barbiturates: NOT DETECTED
Benzodiazepines: NOT DETECTED
COCAINE: NOT DETECTED
OPIATES: NOT DETECTED
Tetrahydrocannabinol: NOT DETECTED

## 2017-06-18 LAB — HCG, QUANTITATIVE, PREGNANCY: HCG, BETA CHAIN, QUANT, S: 29397 m[IU]/mL — AB (ref <5)

## 2017-06-18 MED ORDER — ONDANSETRON 4 MG PO TBDP: 4.0000 mg | ORAL_TABLET | Freq: Three times a day (TID) | ORAL | 0 refills | Status: DC | PRN

## 2017-06-18 NOTE — MAU Provider Note (Signed)
History     CSN: [id number]  Arrival date and time: 06/18/17 1046   First Provider Initiated Contact with Patient 06/18/17 1146      Chief Complaint  Patient presents with  . Vaginal Bleeding   Vaginal Bleeding  The patient's primary symptoms include pelvic pain and vaginal bleeding. This is a new problem. The current episode started yesterday. The problem occurs constantly. Progression since onset: bleeding has stopped, but still having pain.  Pain severity now: 5/10. The problem affects both sides. She is pregnant. Associated symptoms include nausea and vomiting (3-4 x per day, taking phenergan, but it makes her too sleepy. ). Pertinent negatives include no chills, dysuria, fever, frequency or urgency. The vaginal discharge was bloody. The vaginal bleeding is typical of menses. She has not been passing clots. She has not been passing tissue. Nothing aggravates the symptoms. She has tried nothing for the symptoms. The treatment provided no relief. Menstrual history: LMP 05/09/17      Past Medical History:  Diagnosis Date  . Chronic kidney disease   . Herpes   . Pyelonephritis 2014  . Pyelonephritis affecting pregnancy 2013  . UTI in pregnancy 10/2013    Past Surgical History:  Procedure Laterality Date  . CESAREAN SECTION  2007  . INDUCED ABORTION      Family History  Problem Relation Age of Onset  . Hypertension Mother   . Hypertension Maternal Grandfather     Social History   Tobacco Use  . Smoking status: Former Smoker    Types: Cigars    Last attempt to quit: 08/13/2010    Years since quitting: 6.8  . Smokeless tobacco: Never Used  Substance Use Topics  . Alcohol use: Yes    Comment: ocassionally  . Drug use: No    Allergies: No Known Allergies  Medications Prior to Admission  Medication Sig Dispense Refill Last Dose  . cephALEXin (KEFLEX) 500 MG capsule Take 1 capsule (500 mg total) by mouth 4 (four) times daily. 28 capsule 0   . promethazine (PHENERGAN)  25 MG tablet Take 1 tablet (25 mg total) by mouth every 6 (six) hours as needed for nausea or vomiting. 30 tablet 0     Review of Systems  Constitutional: Negative for chills and fever.  Gastrointestinal: Positive for nausea and vomiting (3-4 x per day, taking phenergan, but it makes her too sleepy. ).  Genitourinary: Positive for pelvic pain and vaginal bleeding. Negative for dysuria, frequency and urgency.   Physical Exam   Blood pressure 118/79, pulse 83, temperature 98.6 F (37 C), resp. rate 16, height 5\' 7"  (1.702 m), weight 269 lb (122 kg), last menstrual period 05/09/2017, SpO2 100 %, unknown if currently breastfeeding.  Physical Exam  Nursing note and vitals reviewed. Constitutional: She is oriented to person, place, and time. She appears well-developed and well-nourished. No distress.  HENT:  Head: Normocephalic.  Cardiovascular: Normal rate.  Respiratory: Effort normal.  GI: Soft. There is no tenderness. There is no rebound.  Neurological: She is alert and oriented to person, place, and time.  Skin: Skin is warm and dry.  Psychiatric: She has a normal mood and affect.   Results for orders placed or performed during the hospital encounter of 06/18/17 (from the past 24 hour(s))  Urinalysis, Routine w reflex microscopic     Status: Abnormal   Collection Time: 06/18/17 10:15 AM  Result Value Ref Range   Color, Urine YELLOW YELLOW   APPearance HAZY (A) CLEAR   Specific  Gravity, Urine 1.018 1.005 - 1.030   pH 7.0 5.0 - 8.0   Glucose, UA NEGATIVE NEGATIVE mg/dL   Hgb urine dipstick NEGATIVE NEGATIVE   Bilirubin Urine NEGATIVE NEGATIVE   Ketones, ur 5 (A) NEGATIVE mg/dL   Protein, ur 30 (A) NEGATIVE mg/dL   Nitrite NEGATIVE NEGATIVE   Leukocytes, UA LARGE (A) NEGATIVE   RBC / HPF 0-5 0 - 5 RBC/hpf   WBC, UA TOO NUMEROUS TO COUNT 0 - 5 WBC/hpf   Bacteria, UA RARE (A) NONE SEEN   Squamous Epithelial / LPF 6-30 (A) NONE SEEN   Mucus PRESENT   Urine rapid drug screen (hosp  performed)     Status: None   Collection Time: 06/18/17 10:15 AM  Result Value Ref Range   Opiates NONE DETECTED NONE DETECTED   Cocaine NONE DETECTED NONE DETECTED   Benzodiazepines NONE DETECTED NONE DETECTED   Amphetamines NONE DETECTED NONE DETECTED   Tetrahydrocannabinol NONE DETECTED NONE DETECTED   Barbiturates NONE DETECTED NONE DETECTED  CBC     Status: Abnormal   Collection Time: 06/18/17 12:01 PM  Result Value Ref Range   WBC 10.0 4.0 - 10.5 K/uL   RBC 4.06 3.87 - 5.11 MIL/uL   Hemoglobin 11.8 (L) 12.0 - 15.0 g/dL   HCT 16.135.4 (L) 09.636.0 - 04.546.0 %   MCV 87.2 78.0 - 100.0 fL   MCH 29.1 26.0 - 34.0 pg   MCHC 33.3 30.0 - 36.0 g/dL   RDW 40.914.4 81.111.5 - 91.415.5 %   Platelets 319 150 - 400 K/uL  hCG, quantitative, pregnancy     Status: Abnormal   Collection Time: 06/18/17 12:01 PM  Result Value Ref Range   hCG, Beta Chain, Quant, S 29,397 (H) <5 mIU/mL   [place] Ob Comp Less 14 Wks  Result Date: 06/18/2017 CLINICAL DATA:  Pelvic pain, vaginal bleeding, and vomiting for 1 day. Gestational age by LMP of 5 weeks 5 days. EXAM: OBSTETRIC <14 WK US AND TRANSVAGINAL OB US TECHNIQUE: Both transabdominal and transvaginal ultrasound examinations were performed for complete evaluation of the gestation as well as the maternal uterus, adnexal regions, and pelvic cul-de-sac. Transvaginal technique was performed to assess early pregnancy. COMPARISON:  None. FINDINGS: Intrauterine gestational sac: Single Yolk sac:  Visualized. Embryo:  Visualized. Cardiac Activity: Visualized. Heart Rate: 119  bpm CRL:  5  mm   6 w   1 d                  US EDC: 02/10/2018 Subchorionic hemorrhage:  Small subchorionic hemorrhage noted. Maternal uterus/adnexae: Retroflexed uterus. Normal appearance of both ovaries. No mass or abnormal free fluid identified. IMPRESSION: Single living IUP measuring 6 weeks 1 day, which is concordant with LMP. Small subchorionic hemorrhage. Electronically Signed   By: Myles RosenthalJohn  Stahl M.D.   On: 06/18/2017  13:13   [place] Ob Transvaginal  Result Date: 06/18/2017 CLINICAL DATA:  Pelvic pain, vaginal bleeding, and vomiting for 1 day. Gestational age by LMP of 5 weeks 5 days. EXAM: OBSTETRIC <14 WK US AND TRANSVAGINAL OB US TECHNIQUE: Both transabdominal and transvaginal ultrasound examinations were performed for complete evaluation of the gestation as well as the maternal uterus, adnexal regions, and pelvic cul-de-sac. Transvaginal technique was performed to assess early pregnancy. COMPARISON:  None. FINDINGS: Intrauterine gestational sac: Single Yolk sac:  Visualized. Embryo:  Visualized. Cardiac Activity: Visualized. Heart Rate: 119  bpm CRL:  5  mm   6 w   1 d  US EDC: 02/10/2018 Subchorionic hemorrhage:  Small subchorionic hemorrhage noted. Maternal uterus/adnexae: Retroflexed uterus. Normal appearance of both ovaries. No mass or abnormal free fluid identified. IMPRESSION: Single living IUP measuring 6 weeks 1 day, which is concordant with LMP. Small subchorionic hemorrhage. Electronically Signed   By: Myles RosenthalJohn  Stahl M.D.   On: 06/18/2017 13:13   MAU Course  Procedures  MDM   Assessment and Plan   1. Pelvic pain in pregnancy, antepartum, first trimester   2. Vaginal bleeding in pregnancy, first trimester   3. Intrauterine pregnancy   4. [redacted] weeks gestation of pregnancy   5. Nausea/vomiting in pregnancy    DC home Comfort measures reviewed  1st Trimester precautions  RX: zofran PRN #20  Return to MAU as needed FU with OB as planned  Follow-up Information    [facility].   Specialty:  Obstetrics and Gynecology Contact information: [address] [place] [id number] [phone]           Thressa ShellerHeather Hogan 06/18/2017, 11:48 AM

## 2017-06-18 NOTE — Discharge Instructions (Signed)

## 2017-06-18 NOTE — MAU Note (Signed)
Pt presents with c/o moderate amount VB yesterday reports bleeding bright red without clots.  States no bleeding today, but having menstrual type cramps.  States thinks VB due to lifting at work

## 2017-06-18 NOTE — MAU Note (Signed)
Pt reports she had some vaginal bleeding yesterday, none today, vomiting and feels like she is going to pass out at times.

## 2017-06-24 ENCOUNTER — Inpatient Hospital Stay (HOSPITAL_COMMUNITY)
Admission: AD | Admit: 2017-06-24 | Discharge: 2017-06-24 | Disposition: A | Discharge status: Home / Self Care | Payer: Self-pay | Source: Ambulatory Visit | Attending: Family Medicine | Admitting: Family Medicine

## 2017-06-24 ENCOUNTER — Encounter (HOSPITAL_COMMUNITY): Payer: Self-pay | Admitting: *Deleted

## 2017-06-24 DIAGNOSIS — Z87891 Personal history of nicotine dependence: Secondary | ICD-10-CM | POA: Insufficient documentation

## 2017-06-24 DIAGNOSIS — O2341 Unspecified infection of urinary tract in pregnancy, first trimester: Secondary | ICD-10-CM | POA: Insufficient documentation

## 2017-06-24 DIAGNOSIS — O219 Vomiting of pregnancy, unspecified: Secondary | ICD-10-CM

## 2017-06-24 DIAGNOSIS — O210 Mild hyperemesis gravidarum: Secondary | ICD-10-CM | POA: Insufficient documentation

## 2017-06-24 DIAGNOSIS — O99611 Diseases of the digestive system complicating pregnancy, first trimester: Secondary | ICD-10-CM

## 2017-06-24 DIAGNOSIS — O34219 Maternal care for unspecified type scar from previous cesarean delivery: Secondary | ICD-10-CM | POA: Insufficient documentation

## 2017-06-24 DIAGNOSIS — Z3A01 Less than 8 weeks gestation of pregnancy: Secondary | ICD-10-CM | POA: Insufficient documentation

## 2017-06-24 DIAGNOSIS — K5900 Constipation, unspecified: Secondary | ICD-10-CM | POA: Insufficient documentation

## 2017-06-24 LAB — URINALYSIS, ROUTINE W REFLEX MICROSCOPIC
BILIRUBIN URINE: NEGATIVE
GLUCOSE, UA: NEGATIVE mg/dL
HGB URINE DIPSTICK: NEGATIVE
Ketones, ur: 80 mg/dL — AB
NITRITE: POSITIVE — AB
SPECIFIC GRAVITY, URINE: 1.02 (ref 1.005–1.030)
pH: 7 (ref 5.0–8.0)

## 2017-06-24 MED ORDER — DEXTROSE 5 % IN LACTATED RINGERS IV BOLUS
1000.0000 mL | Freq: Once | INTRAVENOUS | Status: AC
  Administered 2017-06-24: 1000 mL via INTRAVENOUS

## 2017-06-24 MED ORDER — METOCLOPRAMIDE HCL 5 MG/ML IJ SOLN
10.0000 mg | Freq: Once | INTRAMUSCULAR | Status: AC
  Administered 2017-06-24: 10 mg via INTRAVENOUS
  Filled 2017-06-24: qty 2

## 2017-06-24 MED ORDER — CEPHALEXIN 500 MG PO CAPS: 500.0000 mg | ORAL_CAPSULE | Freq: Four times a day (QID) | ORAL | 0 refills | Status: DC

## 2017-06-24 MED ORDER — RANITIDINE HCL 150 MG PO TABS: 150.0000 mg | ORAL_TABLET | Freq: Two times a day (BID) | ORAL | 0 refills | Status: DC

## 2017-06-24 MED ORDER — FAMOTIDINE IN NACL 20-0.9 MG/50ML-% IV SOLN
20.0000 mg | Freq: Once | INTRAVENOUS | Status: AC
  Administered 2017-06-24: 20 mg via INTRAVENOUS
  Filled 2017-06-24: qty 50

## 2017-06-24 MED ORDER — LACTATED RINGERS IV BOLUS (SEPSIS)
1000.0000 mL | Freq: Once | INTRAVENOUS | Status: AC
  Administered 2017-06-24: 1000 mL via INTRAVENOUS

## 2017-06-24 NOTE — MAU Note (Signed)
Patient states unable to eat (observed spitting in triage) +nausea/vomiting--vomited 6 times in the past 24 hours--states has been taking zofran with no relief +generalized body aches States has not had a BM in 4 days

## 2017-06-24 NOTE — Discharge Instructions (Signed)
You have constipation which is hard stools that are difficult to pass. It is important to have regular bowel movements every 1-3 days that are soft and easy to pass. Hard stools increase your risk of hemorrhoids and are very uncomfortable.   To prevent constipation you can increase the amount of fiber in your diet. Examples of foods with fiber are leafy greens, whole grain breads, oatmeal and other grains.  It is also important to drink at least eight 8oz glass of water everyday.   If you have not has a bowel movement in 4-5 days you made need to clean out your bowel.  This will have establish normal movement through your bowel.    Miralax Clean out  Take 8 capfuls of miralax in 64 oz of gatorade. You can use any fluid that appeals to you (gatorade, water, juice)  Continue to drink at least eight 8 oz glasses of water throughout the day  You can repeat with another 8 capfuls of miralax in 64 oz of gatorade if you are not having a large amount of stools  You will need to be at home and close to a bathroom for about 8 hours when you do the above as you may need to go to the bathroom frequently.   After you are cleaned out: - Start Colace100mg  twice daily - Start Miralax once daily - Start a daily fiber supplement like metamucil or citrucel - You can safely use enemas in pregnancy  - if you are having diarrhea you can reduce to Colace once a day or miralax every other day or a 1/2 capful daily.   Constipation, Adult Constipation is when a person:  Poops (has a bowel movement) fewer times in a week than normal.  Has a hard time pooping.  Has poop that is dry, hard, or bigger than normal.  Follow these instructions at home: Eating and drinking   Eat foods that have a lot of fiber, such as: ? Fresh fruits and vegetables. ? Whole grains. ? Beans.  Eat less of foods that are high in fat, low in fiber, or overly processed, such as: ? [place]  fries. ? Hamburgers. ? Cookies. ? Candy. ? Soda.  Drink enough fluid to keep your pee (urine) clear or pale yellow. General instructions  Exercise regularly or as told by your doctor.  Go to the restroom when you feel like you need to poop. Do not hold it in.  Take over-the-counter and prescription medicines only as told by your doctor. These include any fiber supplements.  Do pelvic floor retraining exercises, such as: ? Doing deep breathing while relaxing your lower belly (abdomen). ? Relaxing your pelvic floor while pooping.  Watch your condition for any changes.  Keep all follow-up visits as told by your doctor. This is important. Contact a doctor if:  You have pain that gets worse.  You have a fever.  You have not pooped for 4 days.  You throw up (vomit).  You are not hungry.  You lose weight.  You are bleeding from the anus.  You have thin, pencil-like poop (stool). Get help right away if:  You have a fever, and your symptoms suddenly get worse.  You leak poop or have blood in your poop.  Your belly feels hard or bigger than normal (is bloated).  You have very bad belly pain.  You feel dizzy or you faint. This information is not intended to replace advice given to you by your health care  provider. Make sure you discuss any questions you have with your health care provider. Document Released: 01/16/2008 Document Revised: 02/17/2016 Document Reviewed: 01/18/2016 Elsevier Interactive Patient Education  2017 [employer]

## 2017-06-24 NOTE — MAU Provider Note (Signed)
History     CSN: [id number]  Arrival date and time: 06/24/17 1121   None     Chief Complaint  Patient presents with  . Constipation  . Emesis   HPI   [name] is a 26 y.o. female [phone] @ [redacted]w[redacted]d here in MAU with complaints of N/V and constipation. States she is vomiting multiple times throughout the day despite taking Zofran as prescribed. States the zofran is causing constipation and states she has not had a regular BM in several days. States she is spitting a lot during the day. Not eating much, however is able to eat some things.   OB History    Gravida Para Term Preterm AB Living   5 3 3  0 1 3   SAB TAB Ectopic Multiple Live Births   0 1     3      Past Medical History:  Diagnosis Date  . Chronic kidney disease   . Herpes   . Pyelonephritis 2014  . Pyelonephritis affecting pregnancy 2013  . UTI in pregnancy 10/2013    Past Surgical History:  Procedure Laterality Date  . CESAREAN SECTION  2007  . INDUCED ABORTION      Family History  Problem Relation Age of Onset  . Hypertension Mother   . Hypertension Maternal Grandfather     Social History   Tobacco Use  . Smoking status: Former Smoker    Types: Cigars    Last attempt to quit: 08/13/2010    Years since quitting: 6.8  . Smokeless tobacco: Never Used  Substance Use Topics  . Alcohol use: Yes    Comment: ocassionally  . Drug use: No    Allergies: No Known Allergies  Medications Prior to Admission  Medication Sig Dispense Refill Last Dose  . ondansetron (ZOFRAN ODT) 4 MG disintegrating tablet Take 1 tablet (4 mg total) every 8 (eight) hours as needed by mouth for nausea or vomiting. 20 tablet 0 06/24/2017 at Unknown time  . promethazine (PHENERGAN) 25 MG tablet Take 1 tablet (25 mg total) by mouth every 6 (six) hours as needed for nausea or vomiting. 30 tablet 0 Past Week at Unknown time   Results for orders placed or performed during the hospital encounter of 06/24/17 (from the past 48  hour(s))  Urinalysis, Routine w reflex microscopic     Status: Abnormal   Collection Time: 06/24/17 12:14 PM  Result Value Ref Range   Color, Urine AMBER (A) YELLOW    Comment: BIOCHEMICALS MAY BE AFFECTED BY COLOR   APPearance TURBID (A) CLEAR   Specific Gravity, Urine 1.020 1.005 - 1.030   pH 7.0 5.0 - 8.0   Glucose, UA NEGATIVE NEGATIVE mg/dL   Hgb urine dipstick NEGATIVE NEGATIVE   Bilirubin Urine NEGATIVE NEGATIVE   Ketones, ur 80 (A) NEGATIVE mg/dL   Protein, ur >=130>=300 (A) NEGATIVE mg/dL   Nitrite POSITIVE (A) NEGATIVE   Leukocytes, UA MODERATE (A) NEGATIVE   RBC / HPF 6-30 0 - 5 RBC/hpf   WBC, UA TOO NUMEROUS TO COUNT 0 - 5 WBC/hpf   Bacteria, UA FEW (A) NONE SEEN   Squamous Epithelial / LPF 6-30 (A) NONE SEEN   WBC Clumps PRESENT    Mucus PRESENT     Review of Systems  Constitutional: Positive for chills. Negative for fever.  Gastrointestinal: Positive for nausea and vomiting.  Genitourinary: Negative for dysuria, flank pain and urgency.   Physical Exam   Blood pressure 127/68, pulse 83, temperature 98.2  F (36.8 C), temperature source Oral, resp. rate 18, weight 119.3 kg (263 lb), last menstrual period 05/09/2017, SpO2 100 %, unknown if currently breastfeeding.  Physical Exam  Constitutional: She is oriented to person, place, and time. She appears well-developed and well-nourished. No distress.  Respiratory: Effort normal.  GI: Normal appearance. There is no CVA tenderness.  Musculoskeletal: Normal range of motion.  Neurological: She is alert and oriented to person, place, and time.  Skin: Skin is warm. She is not diaphoretic.  Psychiatric: Her behavior is normal.   MAU Course  Procedures  None  MDM  Urine shows > 80 ketones  D5LR bolus X1 Reglan 10 mg IV, Pepcid, Zofran  Patient became irritated and anxious following reglan LR bolus initiated however patient states she wants her IV out and is ready to go home.  Patient tolerating oral fluids No active  vomiting noted in MAU.  Urine culture pending   Assessment and Plan   A:  1. Nausea and vomiting in pregnancy   2. Constipation during pregnancy in first trimester   3. UTI in pregnancy, first trimester     P:  Discharge home with strict return precautions Miralax at home as directed on the bottle.  Rx: Zantac, keflex Urine culture pending Avoid Zofran until constipation resolves, B6 and unisom  Return to MAU if symptoms worsen    Jasmine Hudson, Jennifer I, NP 06/24/2017 7:46 PM

## 2017-06-27 LAB — CULTURE, OB URINE
Culture: >=100000 — AB
Special Requests: NORMAL

## 2017-06-28 ENCOUNTER — Telehealth: Payer: Self-pay | Admitting: Advanced Practice Midwife

## 2017-06-28 MED ORDER — CLINDAMYCIN HCL 300 MG PO CAPS: 300.0000 mg | ORAL_CAPSULE | Freq: Three times a day (TID) | ORAL | 0 refills | Status: DC

## 2017-06-28 NOTE — Telephone Encounter (Signed)
Attempted to call patient to inform her that we need to change antibiotic. No answer, no voicemail set up. Sent in new rx for clindamycin (based on sensitives) TID to pharmacy on file.  Thressa ShellerHeather Litzy Dicker 4:35 PM 06/28/17

## 2017-06-29 ENCOUNTER — Encounter: Payer: Self-pay | Admitting: Obstetrics and Gynecology

## 2017-06-29 ENCOUNTER — Encounter (HOSPITAL_COMMUNITY): Payer: Self-pay

## 2017-06-29 ENCOUNTER — Inpatient Hospital Stay (HOSPITAL_COMMUNITY)
Admission: AD | Admit: 2017-06-29 | Discharge: 2017-06-30 | Disposition: A | Discharge status: Home / Self Care | Payer: Self-pay | Source: Ambulatory Visit | Attending: Obstetrics and Gynecology | Admitting: Obstetrics and Gynecology

## 2017-06-29 ENCOUNTER — Other Ambulatory Visit: Payer: Self-pay

## 2017-06-29 ENCOUNTER — Inpatient Hospital Stay (HOSPITAL_COMMUNITY): Payer: Self-pay

## 2017-06-29 DIAGNOSIS — Z87891 Personal history of nicotine dependence: Secondary | ICD-10-CM | POA: Insufficient documentation

## 2017-06-29 DIAGNOSIS — O210 Mild hyperemesis gravidarum: Secondary | ICD-10-CM | POA: Insufficient documentation

## 2017-06-29 DIAGNOSIS — O2301 Infections of kidney in pregnancy, first trimester: Secondary | ICD-10-CM

## 2017-06-29 DIAGNOSIS — O219 Vomiting of pregnancy, unspecified: Secondary | ICD-10-CM

## 2017-06-29 DIAGNOSIS — Z3A01 Less than 8 weeks gestation of pregnancy: Secondary | ICD-10-CM

## 2017-06-29 DIAGNOSIS — O34219 Maternal care for unspecified type scar from previous cesarean delivery: Secondary | ICD-10-CM | POA: Insufficient documentation

## 2017-06-29 DIAGNOSIS — Z3481 Encounter for supervision of other normal pregnancy, first trimester: Secondary | ICD-10-CM

## 2017-06-29 DIAGNOSIS — M549 Dorsalgia, unspecified: Secondary | ICD-10-CM

## 2017-06-29 HISTORY — DX: Trichomoniasis, unspecified: A59.9

## 2017-06-29 LAB — CBC WITH DIFFERENTIAL/PLATELET
BASOS ABS: 0 10*3/uL (ref 0.0–0.1)
Basophils Relative: 0 %
EOS ABS: 0.1 10*3/uL (ref 0.0–0.7)
EOS PCT: 1 %
HCT: 37.9 % (ref 36.0–46.0)
Hemoglobin: 12.9 g/dL (ref 12.0–15.0)
Lymphocytes Relative: 30 %
Lymphs Abs: 2.3 10*3/uL (ref 0.7–4.0)
MCH: 30.2 pg (ref 26.0–34.0)
MCHC: 34 g/dL (ref 30.0–36.0)
MCV: 88.8 fL (ref 78.0–100.0)
MONO ABS: 0.2 10*3/uL (ref 0.1–1.0)
Monocytes Relative: 3 %
Neutro Abs: 5.2 10*3/uL (ref 1.7–7.7)
Neutrophils Relative %: 66 %
PLATELETS: 340 10*3/uL (ref 150–400)
RBC: 4.27 MIL/uL (ref 3.87–5.11)
RDW: 13.3 % (ref 11.5–15.5)
WBC: 7.8 10*3/uL (ref 4.0–10.5)

## 2017-06-29 LAB — URINALYSIS, MICROSCOPIC (REFLEX)

## 2017-06-29 LAB — URINALYSIS, ROUTINE W REFLEX MICROSCOPIC
Glucose, UA: 100 mg/dL — AB
Ketones, ur: >80 mg/dL — AB
NITRITE: POSITIVE — AB
PH: 6 (ref 5.0–8.0)
Protein, ur: 30 mg/dL — AB
Specific Gravity, Urine: 1.025 (ref 1.005–1.030)

## 2017-06-29 LAB — COMPREHENSIVE METABOLIC PANEL
ALT: 22 U/L (ref 14–54)
AST: 24 U/L (ref 15–41)
Albumin: 3.1 g/dL — ABNORMAL LOW (ref 3.5–5.0)
Alkaline Phosphatase: 56 U/L (ref 38–126)
Anion gap: 8 (ref 5–15)
CHLORIDE: 105 mmol/L (ref 101–111)
CO2: 23 mmol/L (ref 22–32)
CREATININE: 0.65 mg/dL (ref 0.44–1.00)
Calcium: 8.6 mg/dL — ABNORMAL LOW (ref 8.9–10.3)
Glucose, Bld: 192 mg/dL — ABNORMAL HIGH (ref 65–99)
POTASSIUM: 2.9 mmol/L — AB (ref 3.5–5.1)
SODIUM: 136 mmol/L (ref 135–145)
Total Bilirubin: 0.5 mg/dL (ref 0.3–1.2)
Total Protein: 6.7 g/dL (ref 6.5–8.1)

## 2017-06-29 LAB — TYPE AND SCREEN
ABO/RH(D): A POS
Antibody Screen: NEGATIVE

## 2017-06-29 MED ORDER — FAMOTIDINE IN NACL 20-0.9 MG/50ML-% IV SOLN
20.0000 mg | Freq: Two times a day (BID) | INTRAVENOUS | Status: DC
  Filled 2017-06-29: qty 50

## 2017-06-29 MED ORDER — M.V.I. ADULT IV INJ
Freq: Once | INTRAVENOUS | Status: AC
  Administered 2017-06-29: 15:00:00 via INTRAVENOUS
  Filled 2017-06-29: qty 1000

## 2017-06-29 MED ORDER — LACTATED RINGERS IV BOLUS (SEPSIS)
1000.0000 mL | Freq: Once | INTRAVENOUS | Status: AC
  Administered 2017-06-29: 1000 mL via INTRAVENOUS

## 2017-06-29 MED ORDER — ZOLPIDEM TARTRATE 5 MG PO TABS: 5.0000 mg | ORAL_TABLET | Freq: Every evening | ORAL | Status: DC | PRN

## 2017-06-29 MED ORDER — CEFTRIAXONE SODIUM 1 G IJ SOLR: 1.0000 g | INTRAMUSCULAR | Status: DC

## 2017-06-29 MED ORDER — LACTATED RINGERS IV SOLN: INTRAVENOUS | Status: DC

## 2017-06-29 MED ORDER — CALCIUM CARBONATE ANTACID 500 MG PO CHEW: 2.0000 | CHEWABLE_TABLET | ORAL | Status: DC | PRN

## 2017-06-29 MED ORDER — POTASSIUM CHLORIDE CRYS ER 20 MEQ PO TBCR
30.0000 meq | EXTENDED_RELEASE_TABLET | Freq: Two times a day (BID) | ORAL | Status: DC
  Filled 2017-06-29: qty 1

## 2017-06-29 MED ORDER — DOCUSATE SODIUM 100 MG PO CAPS: 100.0000 mg | ORAL_CAPSULE | Freq: Every day | ORAL | Status: DC

## 2017-06-29 MED ORDER — DOCUSATE SODIUM 100 MG PO CAPS: 100.0000 mg | ORAL_CAPSULE | Freq: Two times a day (BID) | ORAL | Status: DC | PRN

## 2017-06-29 MED ORDER — FAMOTIDINE 20 MG PO TABS
20.0000 mg | ORAL_TABLET | Freq: Two times a day (BID) | ORAL | Status: DC
  Administered 2017-06-29 – 2017-06-30 (×2): 20 mg via ORAL
  Filled 2017-06-29 (×2): qty 1

## 2017-06-29 MED ORDER — DEXTROSE 5 % IV SOLN
2.0000 g | INTRAVENOUS | Status: DC
  Administered 2017-06-29: 2 g via INTRAVENOUS
  Filled 2017-06-29 (×2): qty 2

## 2017-06-29 MED ORDER — PROMETHAZINE HCL 25 MG/ML IJ SOLN
25.0000 mg | Freq: Four times a day (QID) | INTRAMUSCULAR | Status: DC | PRN
  Administered 2017-06-29: 25 mg via INTRAVENOUS
  Filled 2017-06-29: qty 1

## 2017-06-29 MED ORDER — POTASSIUM CHLORIDE CRYS ER 20 MEQ PO TBCR
30.0000 meq | EXTENDED_RELEASE_TABLET | Freq: Two times a day (BID) | ORAL | Status: DC
  Administered 2017-06-29: 22:00:00 30 meq via ORAL
  Filled 2017-06-29 (×2): qty 1

## 2017-06-29 MED ORDER — PRENATAL MULTIVITAMIN CH: 1.0000 | ORAL_TABLET | Freq: Every day | ORAL | Status: DC

## 2017-06-29 MED ORDER — SCOPOLAMINE 1 MG/3DAYS TD PT72: 1.0000 | MEDICATED_PATCH | TRANSDERMAL | Status: DC

## 2017-06-29 MED ORDER — ACETAMINOPHEN 325 MG PO TABS: 650.0000 mg | ORAL_TABLET | ORAL | Status: DC | PRN

## 2017-06-29 MED ORDER — FAMOTIDINE IN NACL 20-0.9 MG/50ML-% IV SOLN
20.0000 mg | Freq: Once | INTRAVENOUS | Status: AC
  Administered 2017-06-29: 20 mg via INTRAVENOUS
  Filled 2017-06-29: qty 50

## 2017-06-29 MED ORDER — POTASSIUM CHLORIDE 2 MEQ/ML IV SOLN
INTRAVENOUS | Status: DC
  Administered 2017-06-29: 18:00:00 via INTRAVENOUS
  Filled 2017-06-29: qty 1000

## 2017-06-29 MED ORDER — PROCHLORPERAZINE MALEATE 10 MG PO TABS
10.0000 mg | ORAL_TABLET | Freq: Four times a day (QID) | ORAL | Status: DC | PRN
  Administered 2017-06-30 (×2): 10 mg via ORAL
  Filled 2017-06-29 (×3): qty 1

## 2017-06-29 MED ORDER — PROMETHAZINE HCL 25 MG/ML IJ SOLN
25.0000 mg | Freq: Once | INTRAMUSCULAR | Status: AC
  Administered 2017-06-29: 25 mg via INTRAVENOUS
  Filled 2017-06-29: qty 1

## 2017-06-29 MED ORDER — PROMETHAZINE HCL 25 MG RE SUPP
25.0000 mg | Freq: Four times a day (QID) | RECTAL | Status: DC | PRN
  Filled 2017-06-29: qty 1

## 2017-06-29 NOTE — H&P (Signed)
Obstetrics Admission History & Physical  06/29/2017 - 5:21 PM Primary OBGYN: [facility] (missed NOB visit)  Chief Complaint: left sided pyelo, n/v of pregnancy  History of Present Illness  26 y.o. [id number] @ 4320w2d (Dating: LMP=6wk u/s), with the above CC. Pregnancy complicated by: h/o c-section x 3, hsv.  Patient states she had n/v of pregnancy with her prior pregnancies. No fevers, chills, chest pain, sob. Didn't take PO abx that was prescribed for her UTI dx on 11/12. No s/s of SAB  Review of Systems: as noted in the History of Present Illness.   PMHx:  Past Medical History:  Diagnosis Date  . Chronic kidney disease   . Herpes   . Pyelonephritis affecting pregnancy 2013  . Trichomoniasis 09/18/2013   09/18/13 Phone number disconnected > requested letter to be sent or inform at next visit    PSHx:  Past Surgical History:  Procedure Laterality Date  . CESAREAN SECTION  2007  . CESAREAN SECTION N/A 10/23/2013   [facility]att[facility]  . CESAREAN SECTION N/A 10/23/2013   [facility]formed by Tereso NewcomerAnyanwu, Ugonna A[facility]  . CESAREAN SECTION N/A 02/29/2012   [facility] Tilda BurrowFerguson, John V[facility]  . INDUCED ABORTION     Medications:  Medications Prior to Admission  Medication Sig Dispense Refill Last Dose  . promethazine (PHENERGAN) 25 MG tablet Take 1 tablet (25 mg total) by mouth every 6 (six) hours as needed for nausea or vomiting. 30 tablet 0 06/28/2017 at Unknown time  . cephALEXin (KEFLEX) 500 MG capsule Take 1 capsule (500 mg total) 4 (four) times daily by mouth. (Patient not taking: Reported on 06/29/2017) 28 capsule 0 Not Taking at Unknown time  . clindamycin (CLEOCIN) 300 MG capsule Take 1 capsule (300 mg total) 3 (three) times daily by mouth. (Patient not taking: Reported on 06/29/2017) 21 capsule 0 Not Taking at Unknown time  . ondansetron (ZOFRAN ODT) 4 MG disintegrating tablet Take 1 tablet (4 mg total) every 8 (eight) hours as needed  by mouth for nausea or vomiting. (Patient not taking: Reported on 06/29/2017) 20 tablet 0 Not Taking at Unknown time  . ranitidine (ZANTAC) 150 MG tablet Take 1 tablet (150 mg total) 2 (two) times daily by mouth. (Patient not taking: Reported on 06/29/2017) 60 tablet 0 Not Taking at Unknown time     Allergies: has No Known Allergies. OBHx:  OB History  Gravida Para Term Preterm AB Living  5 3 3  0 1 3  SAB TAB Ectopic Multiple Live Births  0 1     3    # Outcome Date GA Lbr Len/2nd Weight Sex Delivery Anes PTL Lv  5 Current           4 Term 10/23/13 5671w3d  7 lb 8.1 oz (3.405 kg) F CS-LTranv Spinal  LIV  3 Term 02/29/12 3791w5d  7 lb 14.6 oz (3.59 kg) M CS-LTranv Spinal  LIV     Birth Comments: Elected repeat  2 Term 11/09/05 3755w0d  8 lb 10 oz (3.912 kg) F CS-LTranv   LIV     Birth Comments: Failure to progress  1 TAB                  FHx:  Family History  Problem Relation Age of Onset  . Hypertension Mother   . Hypertension Maternal Grandfather    Soc Hx:  Social History   Socioeconomic History  .  Marital status: Single    Spouse name: Not on file  . Number of children: Not on file  . Years of education: Not on file  . Highest education level: Not on file  Social Needs  . Financial resource strain: Not on file  . Food insecurity - worry: Not on file  . Food insecurity - inability: Not on file  . Transportation needs - medical: Not on file  . Transportation needs - non-medical: Not on file  Occupational History  . Not on file  Tobacco Use  . Smoking status: Former Smoker    Types: Cigars    Last attempt to quit: 08/13/2010    Years since quitting: 6.8  . Smokeless tobacco: Never Used  Substance and Sexual Activity  . Alcohol use: No    Frequency: Never    Comment: ocassionally  . Drug use: No  . Sexual activity: Yes    Birth control/protection: None    Comment: Patient want birth control pills  Other Topics Concern  . Not on file  Social History Narrative  .  Not on file    Objective    Current Vital Signs 24h Vital Sign Ranges  T 98.1 F (36.7 C) Temp  Avg: 98.2 F (36.8 C)  Min: 98.1 F (36.7 C)  Max: 98.3 F (36.8 C)  BP 129/69 BP  Min: 127/78  Max: 129/69  HR 85 Pulse  Avg: 85  Min: 85  Max: 85  RR 16 Resp  Avg: 16  Min: 16  Max: 16  SaO2 100 % Not Delivered SpO2  Avg: 100 %  Min: 100 %  Max: 100 %       24 Hour I/O Current Shift I/O  Time Ins Outs No intake/output data recorded. No intake/output data recorded.    From MAU Physical Exam  Constitutional: She is oriented to person, place, and time. She appears well-developed and well-nourished. No distress.  HENT:  Head: Normocephalic.  Eyes: Pupils are equal, round, and reactive to light.  Respiratory: Effort normal.  GI: Normal appearance. There is CVA tenderness (Left CVA tenderness ).  Neurological: She is alert and oriented to person, place, and time.  Skin: Skin is warm. She is not diaphoretic.  Psychiatric: Her behavior is normal.   Labs  Pending: UCx  Recent Labs  Lab 06/29/17 1545  WBC 7.8  HGB 12.9  HCT 37.9  PLT 340    Recent Labs  Lab 06/29/17 1545  NA 136  K 2.9*  CL 105  CO2 23  BUN <5*  CREATININE 0.65  CALCIUM 8.6*  PROT 6.7  BILITOT 0.5  ALKPHOS 56  ALT 22  AST 24  GLUCOSE 192*    Radiology Left renal u/s: negative  Assessment & Plan   26 y.o. [id number] @ 3467w2d with n/v of pregnancy, left sided pyelo *Pregnancy: bedside u/s prior to d/c *N/V of pregnancy: scope patch, PRN IV anti-emetics, full diet adat regular, IVFs. Replace K with IV and PO *Pyelo: f/u cx. Continue rocephin. I paged ID to see if rocephin is sufficient coverage *PPx: SCDs, OOB ad lib  Cornelia Copaharlie Kathye Cipriani, Jr. MD Attending [facility](Faculty Practice)

## 2017-06-29 NOTE — MAU Provider Note (Signed)
History     CSN: [id number]  Arrival date and time: 06/29/17 1108   First Provider Initiated Contact with Patient 06/29/17 1218      Chief Complaint  Patient presents with  . Emesis  . Generalized Body Aches   HPI   [name] is a 26 y.o. female [phone] @ 2074w2d here in MAU with nausea, vomiting and generalized body aches. States she feels terrible. States she cannot keep anything down. States she is vomiting 8 times per day. States she is taking promethazine for the symptoms; last dose was yesterday. She was taking Zofran however it was causing constipation. Was given antibiotics for a UTI last week, however has not taken the medication.    OB History    Gravida Para Term Preterm AB Living   5 3 3  0 1 3   SAB TAB Ectopic Multiple Live Births   0 1     3      Past Medical History:  Diagnosis Date  . Chronic kidney disease   . Herpes   . Pyelonephritis 2014  . Pyelonephritis affecting pregnancy 2013  . UTI in pregnancy 10/2013    Past Surgical History:  Procedure Laterality Date  . CESAREAN SECTION  2007  . CESAREAN SECTION N/A 10/23/2013   [facility]att[facility]  . CESAREAN SECTION N/A 10/23/2013   [facility]formed [facility]res[facility] NewcomerAnyanwu[facility]  . CESAREAN SECTION N/A 02/29/2012   [facility]erguson[facility]  . INDUCED ABORTION      Family History  Problem Relation Age of Onset  . Hypertension Mother   . Hypertension Maternal Grandfather     Social History   Tobacco Use  . Smoking status: Former Smoker    Types: Cigars    Last attempt to quit: 08/13/2010    Years since quitting: 6.8  . Smokeless tobacco: Never Used  Substance Use Topics  . Alcohol use: No    Frequency: Never    Comment: ocassionally  . Drug use: No    Allergies: No Known Allergies  Medications Prior to Admission  Medication Sig Dispense Refill Last Dose  . promethazine (PHENERGAN) 25 MG tablet Take 1 tablet (25 mg total) by mouth every 6 (six)  hours as needed for nausea or vomiting. 30 tablet 0 06/28/2017 at Unknown time  . cephALEXin (KEFLEX) 500 MG capsule Take 1 capsule (500 mg total) 4 (four) times daily by mouth. (Patient not taking: Reported on 06/29/2017) 28 capsule 0 Not Taking at Unknown time  . clindamycin (CLEOCIN) 300 MG capsule Take 1 capsule (300 mg total) 3 (three) times daily by mouth. (Patient not taking: Reported on 06/29/2017) 21 capsule 0 Not Taking at Unknown time  . ondansetron (ZOFRAN ODT) 4 MG disintegrating tablet Take 1 tablet (4 mg total) every 8 (eight) hours as needed by mouth for nausea or vomiting. (Patient not taking: Reported on 06/29/2017) 20 tablet 0 Not Taking at Unknown time  . ranitidine (ZANTAC) 150 MG tablet Take 1 tablet (150 mg total) 2 (two) times daily by mouth. (Patient not taking: Reported on 06/29/2017) 60 tablet 0 Not Taking at Unknown time   Results for orders placed or performed during the hospital encounter of 06/29/17 (from the past 48 hour(s))  Urinalysis, Routine w reflex microscopic     Status: Abnormal   Collection Time: 06/29/17 11:26 AM  Result Value Ref Range   Color, Urine YELLOW YELLOW  APPearance TURBID (A) CLEAR   Specific Gravity, Urine 1.025 1.005 - 1.030   pH 6.0 5.0 - 8.0   Glucose, UA 100 (A) NEGATIVE mg/dL   Hgb urine dipstick TRACE (A) NEGATIVE   Bilirubin Urine SMALL (A) NEGATIVE   Ketones, ur >80 (A) NEGATIVE mg/dL   Protein, ur 30 (A) NEGATIVE mg/dL   Nitrite POSITIVE (A) NEGATIVE   Leukocytes, UA SMALL (A) NEGATIVE  Urinalysis, Microscopic (reflex)     Status: Abnormal   Collection Time: 06/29/17 11:26 AM  Result Value Ref Range   RBC / HPF 0-5 0 - 5 RBC/hpf   WBC, UA 6-30 0 - 5 WBC/hpf   Bacteria, UA MANY (A) NONE SEEN   Squamous Epithelial / LPF 0-5 (A) NONE SEEN   Mucus PRESENT    Review of Systems  Constitutional: Positive for appetite change, chills, fatigue and unexpected weight change. Negative for fever.  Gastrointestinal: Negative for  abdominal pain.  Genitourinary: Positive for decreased urine volume, difficulty urinating and flank pain. Negative for dysuria, vaginal bleeding and vaginal discharge.  Musculoskeletal: Positive for back pain.   Physical Exam   Blood pressure 127/78, temperature 98.3 F (36.8 C), temperature source Oral, resp. rate 16, weight 115.7 kg (255 lb), last menstrual period 05/09/2017, SpO2 100 %, unknown if currently breastfeeding.  Physical Exam  Constitutional: She is oriented to person, place, and time. She appears well-developed and well-nourished. No distress.  HENT:  Head: Normocephalic.  Eyes: Pupils are equal, round, and reactive to light.  Respiratory: Effort normal.  GI: Normal appearance. There is CVA tenderness (Left CVA tenderness ).  Neurological: She is alert and oriented to person, place, and time.  Skin: Skin is warm. She is not diaphoretic.  Psychiatric: Her behavior is normal.   MAU Course  Procedures  None  MDM  LR bolus X 1 MVI LR X 1 Phenergan 25 mg IV Pepcid IV Rocephin 2 grams IV Urine culture on 11/12 shows >100,000 of  STAPHYLOCOCCUS SAPROPHYTICUS; without proper treatment patient likely has developed pyelonephritis.  Discussed admission with Dr. Vergie LivingPickens.  Will admit for IV antibiotics and hyperemesis management.   Assessment and Plan   A:  1. Hyperemesis affecting pregnancy, antepartum   2. Pyelonephritis affecting pregnancy in first trimester     P:  Admit to high risk ob Phenergan IV Q6 Pepcid BID Rocephin 2 grams Q 24 hours Urine culture pending CBC & CMP  Renal US per Dr. Darryl LentPickens   Jasmine Hudson, Jasmine RutherfordJennifer I, NP 06/29/2017 3:22 PM

## 2017-06-29 NOTE — MAU Note (Addendum)
Has severe morning sickness.  Is dehydrated, her whole body aches.  Is hard for her to urine (not going much). Constantly has to spit.

## 2017-06-30 DIAGNOSIS — O210 Mild hyperemesis gravidarum: Secondary | ICD-10-CM

## 2017-06-30 MED ORDER — CLINDAMYCIN HCL 300 MG PO CAPS: 300.0000 mg | ORAL_CAPSULE | Freq: Three times a day (TID) | ORAL | 0 refills | Status: DC

## 2017-06-30 MED ORDER — PROCHLORPERAZINE MALEATE 10 MG PO TABS: 10.0000 mg | ORAL_TABLET | Freq: Four times a day (QID) | ORAL | 2 refills | Status: DC | PRN

## 2017-06-30 MED ORDER — CLINDAMYCIN HCL 300 MG PO CAPS
300.0000 mg | ORAL_CAPSULE | Freq: Three times a day (TID) | ORAL | Status: DC
  Administered 2017-06-30: 300 mg via ORAL
  Filled 2017-06-30 (×4): qty 1

## 2017-06-30 MED ORDER — SULFAMETHOXAZOLE-TRIMETHOPRIM 400-80 MG PO TABS: 1.0000 | ORAL_TABLET | Freq: Two times a day (BID) | ORAL | Status: DC

## 2017-06-30 MED ORDER — PROMETHAZINE HCL 25 MG RE SUPP: 25.0000 mg | Freq: Four times a day (QID) | RECTAL | 0 refills | Status: DC | PRN

## 2017-06-30 NOTE — Progress Notes (Addendum)
FACULTY PRACTICE ANTEPARTUM(COMPREHENSIVE) NOTE  Jasmine Hudson is a 26 y.o. [phone] [id number] at 1860w3d b who is admitted for recurrent UTI, 2nd organism, staph saprophyticus, after E Coli S to all antibiotics..   IV reportedly infiltrated L antecubital space last night, now is fine. Fetal presentation is n/a. Length of Stay:  0  Days  Subjective: Pt with multiple minor complaints, but is doing well. Rates back pain as an 8 at admission now a 4, but she acknowledges her scale runs 0-20. :) Voided x 3 so far, is hungry for a biscuit, has drank juice, and liquids and pudding.  Vitals:  Blood pressure (!) 118/58, pulse 82, temperature 98.2 F (36.8 C), temperature source Oral, resp. rate 18, height 5\' 7"  (1.702 m), weight 255 lb (115.7 kg), last menstrual period 05/09/2017, SpO2 100 %, unknown if currently breastfeeding. Physical Examination:  General appearance - alert, well appearing, and in no distress Heart - normal rate and regular rhythm Abdomen - soft, nontender, nondistended Fundal Height:   Cervical Exam: Not evaluated.  Extremities: extremities normal, atraumatic, no cyanosis or edema and Homans sign is negative, no sign of DVT with DTRs 2+ bilaterally, both arms symmetric , no swelling . Membranes:  Fetal Monitoring:  n/a  Labs:  Results for orders placed or performed during the hospital encounter of 06/29/17 (from the past 24 hour(s))  Urinalysis, Routine w reflex microscopic   Collection Time: 06/29/17 11:26 AM  Result Value Ref Range   Color, Urine YELLOW YELLOW   APPearance TURBID (A) CLEAR   Specific Gravity, Urine 1.025 1.005 - 1.030   pH 6.0 5.0 - 8.0   Glucose, UA 100 (A) NEGATIVE mg/dL   Hgb urine dipstick TRACE (A) NEGATIVE   Bilirubin Urine SMALL (A) NEGATIVE   Ketones, ur >80 (A) NEGATIVE mg/dL   Protein, ur 30 (A) NEGATIVE mg/dL   Nitrite POSITIVE (A) NEGATIVE   Leukocytes, UA SMALL (A) NEGATIVE  Urinalysis, Microscopic (reflex)   Collection Time: 06/29/17 11:26 AM   Result Value Ref Range   RBC / HPF 0-5 0 - 5 RBC/hpf   WBC, UA 6-30 0 - 5 WBC/hpf   Bacteria, UA MANY (A) NONE SEEN   Squamous Epithelial / LPF 0-5 (A) NONE SEEN   Mucus PRESENT   CBC with Differential   Collection Time: 06/29/17  3:45 PM  Result Value Ref Range   WBC 7.8 4.0 - 10.5 K/uL   RBC 4.27 3.87 - 5.11 MIL/uL   Hemoglobin 12.9 12.0 - 15.0 g/dL   HCT 14.737.9 82.936.0 - 56.246.0 %   MCV 88.8 78.0 - 100.0 fL   MCH 30.2 26.0 - 34.0 pg   MCHC 34.0 30.0 - 36.0 g/dL   RDW 13.013.3 86.511.5 - 78.415.5 %   Platelets 340 150 - 400 K/uL   Neutrophils Relative % 66 %   Neutro Abs 5.2 1.7 - 7.7 K/uL   Lymphocytes Relative 30 %   Lymphs Abs 2.3 0.7 - 4.0 K/uL   Monocytes Relative 3 %   Monocytes Absolute 0.2 0.1 - 1.0 K/uL   Eosinophils Relative 1 %   Eosinophils Absolute 0.1 0.0 - 0.7 K/uL   Basophils Relative 0 %   Basophils Absolute 0.0 0.0 - 0.1 K/uL  Comprehensive metabolic panel   Collection Time: 06/29/17  3:45 PM  Result Value Ref Range   Sodium 136 135 - 145 mmol/L   Potassium 2.9 (L) 3.5 - 5.1 mmol/L   Chloride 105 101 - 111 mmol/L   CO2  23 22 - 32 mmol/L   Glucose, Bld 192 (H) 65 - 99 mg/dL   BUN <5 (L) 6 - 20 mg/dL   Creatinine, Ser 1.610.65 0.44 - 1.00 mg/dL   Calcium 8.6 (L) 8.9 - 10.3 mg/dL   Total Protein 6.7 6.5 - 8.1 g/dL   Albumin 3.1 (L) 3.5 - 5.0 g/dL   AST 24 15 - 41 U/L   ALT 22 14 - 54 U/L   Alkaline Phosphatase 56 38 - 126 U/L   Total Bilirubin 0.5 0.3 - 1.2 mg/dL   GFR calc non Af Amer >60 >60 mL/min   GFR calc Af Amer >60 >60 mL/min   Anion gap 8 5 - 15  Type and screen [facility]   Collection Time: 06/29/17  3:45 PM  Result Value Ref Range   ABO/RH(D) A POS    Antibody Screen NEG    Sample Expiration 07/02/2017     Imaging Studies:     Currently EPIC will not allow sonographic studies to automatically populate into notes.  In the meantime, copy and paste results into note or free text.  Medications:  Scheduled . famotidine  20 mg Oral BID   . potassium chloride  30 mEq Oral BID  . prenatal multivitamin  1 tablet Oral Q1200  . scopolamine  1 patch Transdermal Q72H   I have reviewed the patient's current medications.  ASSESSMENT: recurrent UTI, being treated agressively like a pyelo .                              Nausea and vomiting of early pregnancy Patient Active Problem List   Diagnosis Date Noted  . Pyelonephritis affecting pregnancy in first trimester 06/29/2017  . Supervision of normal intrauterine pregnancy in multigravida in first trimester 06/29/2017  . Previous cesarean section 09/10/2013  . Herpes 11/22/2011    PLAN: If pt tolerates solids as expected,  1/ Pt a candidate for outpt care as she is voiding and afebrile and no cvat on my exam 2. Will Rx Septra DS Bid x 10 d, then suppress with macrodantin HS til delivery. 3 will not restart IV. D/c later today on Compazine, with phenergan suppos as backu/p [name] 06/30/2017,10:10 AM    Patient ID: Jasmine Hudson, female   DOB: 05/22/1991, 26 y.o.   MRN: [id number]

## 2017-06-30 NOTE — Progress Notes (Signed)
Patient sleeping. RN to call when she is awake.

## 2017-06-30 NOTE — Discharge Instructions (Signed)

## 2017-06-30 NOTE — Progress Notes (Signed)
All discharge teaching completed with the patient. Printed instructions given to the patient. Patient verbalizes an understanding of the discharge teaching. No questions or concerns voiced at this time.

## 2017-06-30 NOTE — Discharge Summary (Signed)
Physician Discharge Summary  Patient ID: Jasmine Hudson MRN: [id number] DOB/AGE: November 26, 1990 26 y.o.  Admit date: 06/29/2017 Discharge date: 06/30/2017  Admission Diagnoses: Pyelonephritis first trimester                                          IUP 7 wks                                           Mild dehydration Discharge Diagnoses:  Active Problems:   Pyelonephritis affecting pregnancy in first trimester   Supervision of normal intrauterine pregnancy in multigravida in first trimester resolved dehydration Nausea and vomiting of pregnancy  Discharged Condition: good  Hospital Course: Primary OBGYN: [facility] (missed NOB visit)  Chief Complaint: left sided pyelo, n/v of pregnancy  History of Present Illness   Jasmine Hudson is a 26 y.o. [id number] at 8072w3d b who is admitted for recurrent UTI, 2nd organism, staph saprophyticus, after E Coli S to all antibiotics..   IV reportedly infiltrated L antecubital space last          Consults: None  Significant Diagnostic Studies: microbiology: urine culture: positive for Staph saprophyticus R to oxacillin S to clindamycin  Treatments: antibiotics: ceftriaxone x 1 day then clindamycin po at discharge  Discharge Exam: Blood pressure 117/66, pulse 83, temperature 98.1 F (36.7 C), temperature source Oral, resp. rate 18, height 5\' 7"  (1.702 m), weight 255 lb (115.7 kg), last menstrual period 05/09/2017, SpO2 100 %, unknown if currently breastfeeding. General appearance: alert, cooperative and no distress Head: Normocephalic, without obvious abnormality, atraumatic Resp: clear to auscultation bilaterally CVAT resolved. Disposition: 01-Home or Self Care   Allergies as of 06/30/2017   No Known Allergies     Medication List    STOP taking these medications   cephALEXin 500 MG capsule Commonly known as:  KEFLEX   ondansetron 4 MG disintegrating tablet Commonly known as:  ZOFRAN ODT   promethazine 25 MG  tablet Commonly known as:  PHENERGAN Replaced by:  promethazine 25 MG suppository     TAKE these medications   clindamycin 300 MG capsule Commonly known as:  CLEOCIN Take 1 capsule (300 mg total) every 8 (eight) hours by mouth. What changed:  You were already taking a medication with the same name, and this prescription was added. Make sure you understand how and when to take each.   clindamycin 300 MG capsule Commonly known as:  CLEOCIN Take 1 capsule (300 mg total) 3 (three) times daily by mouth. What changed:  Another medication with the same name was added. Make sure you understand how and when to take each.   prochlorperazine 10 MG tablet Commonly known as:  COMPAZINE Take 1 tablet (10 mg total) every 6 (six) hours as needed by mouth for nausea or vomiting (1st line).   promethazine 25 MG suppository Commonly known as:  PHENERGAN Place 1 suppository (25 mg total) every 6 (six) hours as needed rectally for nausea or vomiting (2nd line). Replaces:  promethazine 25 MG tablet   ranitidine 150 MG tablet Commonly known as:  ZANTAC Take 1 tablet (150 mg total) 2 (two) times daily by mouth.      Follow-up Information    [facility] week([facility]).  Specialty:  Obstetrics and Gynecology Contact information: [address], [address] [place] [id number] [phone]          Signed: Tilda BurrowJohn V Zachary Nole 06/30/2017, 1:25 PM

## 2017-07-01 LAB — CULTURE, OB URINE: Special Requests: NORMAL

## 2017-08-28 ENCOUNTER — Encounter (HOSPITAL_COMMUNITY): Payer: Self-pay

## 2017-08-28 ENCOUNTER — Other Ambulatory Visit: Payer: Self-pay

## 2017-08-28 ENCOUNTER — Inpatient Hospital Stay (HOSPITAL_COMMUNITY)
Admission: AD | Admit: 2017-08-28 | Discharge: 2017-08-28 | Disposition: A | Discharge status: Home / Self Care | Payer: Medicaid Other | Source: Ambulatory Visit | Attending: Obstetrics and Gynecology | Admitting: Obstetrics and Gynecology

## 2017-08-28 DIAGNOSIS — E860 Dehydration: Secondary | ICD-10-CM

## 2017-08-28 DIAGNOSIS — R109 Unspecified abdominal pain: Secondary | ICD-10-CM | POA: Diagnosis present

## 2017-08-28 DIAGNOSIS — O9A219 Injury, poisoning and certain other consequences of external causes complicating pregnancy, unspecified trimester: Secondary | ICD-10-CM

## 2017-08-28 DIAGNOSIS — N189 Chronic kidney disease, unspecified: Secondary | ICD-10-CM | POA: Insufficient documentation

## 2017-08-28 DIAGNOSIS — O26892 Other specified pregnancy related conditions, second trimester: Secondary | ICD-10-CM | POA: Diagnosis not present

## 2017-08-28 DIAGNOSIS — O99282 Endocrine, nutritional and metabolic diseases complicating pregnancy, second trimester: Secondary | ICD-10-CM | POA: Insufficient documentation

## 2017-08-28 DIAGNOSIS — Z87891 Personal history of nicotine dependence: Secondary | ICD-10-CM | POA: Diagnosis not present

## 2017-08-28 DIAGNOSIS — O0931 Supervision of pregnancy with insufficient antenatal care, first trimester: Secondary | ICD-10-CM

## 2017-08-28 DIAGNOSIS — Z79899 Other long term (current) drug therapy: Secondary | ICD-10-CM | POA: Insufficient documentation

## 2017-08-28 DIAGNOSIS — Z3A15 15 weeks gestation of pregnancy: Secondary | ICD-10-CM | POA: Diagnosis not present

## 2017-08-28 LAB — URINALYSIS, ROUTINE W REFLEX MICROSCOPIC
BACTERIA UA: NONE SEEN
Bilirubin Urine: NEGATIVE
Glucose, UA: NEGATIVE mg/dL
Hgb urine dipstick: NEGATIVE
Ketones, ur: 80 mg/dL — AB
LEUKOCYTES UA: NEGATIVE
Nitrite: NEGATIVE
Protein, ur: 100 mg/dL — AB
SPECIFIC GRAVITY, URINE: 1.029 (ref 1.005–1.030)
pH: 5 (ref 5.0–8.0)

## 2017-08-28 MED ORDER — IBUPROFEN 600 MG PO TABS
600.0000 mg | ORAL_TABLET | Freq: Once | ORAL | Status: AC
  Administered 2017-08-28: 600 mg via ORAL
  Filled 2017-08-28: qty 1

## 2017-08-28 NOTE — Discharge Instructions (Signed)
Abdominal Pain During Pregnancy Abdominal pain is common in pregnancy. Most of the time, it does not cause harm. There are many causes of abdominal pain. Some causes are more serious than others and sometimes the cause is not known. Abdominal pain can be a sign that something is very wrong with the pregnancy or the pain may have nothing to do with the pregnancy. Always tell your health care provider if you have any abdominal pain. Follow these instructions at home:  Do not have sex or put anything in your vagina until your symptoms go away completely.  Watch your abdominal pain for any changes.  Get plenty of rest until your pain improves.  Drink enough fluid to keep your urine clear or pale yellow.  Take over-the-counter or prescription medicines only as told by your health care provider.  Keep all follow-up visits as told by your health care provider. This is important. Contact a health care provider if:  You have a fever.  Your pain gets worse or you have cramping.  Your pain continues after resting. Get help right away if:  You are bleeding, leaking fluid, or passing tissue from the vagina.  You have vomiting or diarrhea that does not go away.  You have painful or bloody urination.  You notice a decrease in your baby's movements.  You feel very weak or faint.  You have shortness of breath.  You develop a severe headache with abdominal pain.  You have abnormal vaginal discharge with abdominal pain. This information is not intended to replace advice given to you by your health care provider. Make sure you discuss any questions you have with your health care provider. Document Released: 07/30/2005 Document Revised: 05/10/2016 Document Reviewed: 02/26/2013 Elsevier Interactive Patient Education  2018 [employer]  Second Trimester of Pregnancy The second trimester is from week 13 through week 28, month 4 through 6. This is often the time in pregnancy that you feel your  best. Often times, morning sickness has lessened or quit. You may have more energy, and you may get hungry more often. Your unborn baby (fetus) is growing rapidly. At the end of the sixth month, he or she is about 9 inches long and weighs about 1 pounds. You will likely feel the baby move (quickening) between 18 and 20 weeks of pregnancy. Follow these instructions at home:  Avoid all smoking, herbs, and alcohol. Avoid drugs not approved by your doctor.  Do not use any tobacco products, including cigarettes, chewing tobacco, and electronic cigarettes. If you need help quitting, ask your doctor. You may get counseling or other support to help you quit.  Only take medicine as told by your doctor. Some medicines are safe and some are not during pregnancy.  Exercise only as told by your doctor. Stop exercising if you start having cramps.  Eat regular, healthy meals.  Wear a good support bra if your breasts are tender.  Do not use hot tubs, steam rooms, or saunas.  Wear your seat belt when driving.  Avoid raw meat, uncooked cheese, and liter boxes and soil used by cats.  Take your prenatal vitamins.  Take 1500-2000 milligrams of calcium daily starting at the 20th week of pregnancy until you deliver your baby.  Try taking medicine that helps you poop (stool softener) as needed, and if your doctor approves. Eat more fiber by eating fresh fruit, vegetables, and whole grains. Drink enough fluids to keep your pee (urine) clear or pale yellow.  Take warm water baths (  sitz baths) to soothe pain or discomfort caused by hemorrhoids. Use hemorrhoid cream if your doctor approves.  If you have puffy, bulging veins (varicose veins), wear support hose. Raise (elevate) your feet for 15 minutes, 3-4 times a day. Limit salt in your diet.  Avoid heavy lifting, wear low heals, and sit up straight.  Rest with your legs raised if you have leg cramps or low back pain.  Visit your dentist if you have not gone  during your pregnancy. Use a soft toothbrush to brush your teeth. Be gentle when you floss.  You can have sex (intercourse) unless your doctor tells you not to.  Go to your doctor visits. Get help if:  You feel dizzy.  You have mild cramps or pressure in your lower belly (abdomen).  You have a nagging pain in your belly area.  You continue to feel sick to your stomach (nauseous), throw up (vomit), or have watery poop (diarrhea).  You have bad smelling fluid coming from your vagina.  You have pain with peeing (urination). Get help right away if:  You have a fever.  You are leaking fluid from your vagina.  You have spotting or bleeding from your vagina.  You have severe belly cramping or pain.  You lose or gain weight rapidly.  You have trouble catching your breath and have chest pain.  You notice sudden or extreme puffiness (swelling) of your face, hands, ankles, feet, or legs.  You have not felt the baby move in over an hour.  You have severe headaches that do not go away with medicine.  You have vision changes. This information is not intended to replace advice given to you by your health care provider. Make sure you discuss any questions you have with your health care provider. Document Released: 10/24/2009 Document Revised: 01/05/2016 Document Reviewed: 09/30/2012 Elsevier Interactive Patient Education  2017 [employer].
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
## 2017-10-10 ENCOUNTER — Encounter (HOSPITAL_COMMUNITY): Payer: Self-pay

## 2017-10-10 ENCOUNTER — Inpatient Hospital Stay (HOSPITAL_COMMUNITY)
Admission: AD | Admit: 2017-10-10 | Discharge: 2017-10-10 | Disposition: A | Discharge status: Home / Self Care | Payer: Medicaid Other | Source: Ambulatory Visit | Attending: Obstetrics and Gynecology | Admitting: Obstetrics and Gynecology

## 2017-10-10 DIAGNOSIS — Z87891 Personal history of nicotine dependence: Secondary | ICD-10-CM | POA: Diagnosis not present

## 2017-10-10 DIAGNOSIS — O9989 Other specified diseases and conditions complicating pregnancy, childbirth and the puerperium: Secondary | ICD-10-CM | POA: Diagnosis not present

## 2017-10-10 DIAGNOSIS — O99512 Diseases of the respiratory system complicating pregnancy, second trimester: Secondary | ICD-10-CM | POA: Diagnosis not present

## 2017-10-10 DIAGNOSIS — J0100 Acute maxillary sinusitis, unspecified: Secondary | ICD-10-CM

## 2017-10-10 DIAGNOSIS — R0981 Nasal congestion: Secondary | ICD-10-CM | POA: Diagnosis present

## 2017-10-10 DIAGNOSIS — Z3A22 22 weeks gestation of pregnancy: Secondary | ICD-10-CM

## 2017-10-10 LAB — URINALYSIS, ROUTINE W REFLEX MICROSCOPIC
BILIRUBIN URINE: NEGATIVE
Glucose, UA: NEGATIVE mg/dL
HGB URINE DIPSTICK: NEGATIVE
Ketones, ur: NEGATIVE mg/dL
Nitrite: NEGATIVE
PROTEIN: NEGATIVE mg/dL
Specific Gravity, Urine: 1.024 (ref 1.005–1.030)
pH: 7 (ref 5.0–8.0)

## 2017-10-10 MED ORDER — PSEUDOEPHEDRINE HCL 30 MG PO TABS: 30.0000 mg | ORAL_TABLET | Freq: Four times a day (QID) | ORAL | 0 refills | Status: DC | PRN

## 2017-10-10 MED ORDER — SALINE SPRAY 0.65 % NA SOLN: 1.0000 | NASAL | 0 refills | Status: DC | PRN

## 2017-10-10 MED ORDER — AMOXICILLIN-POT CLAVULANATE 875-125 MG PO TABS: 1.0000 | ORAL_TABLET | Freq: Two times a day (BID) | ORAL | 0 refills | Status: AC

## 2017-10-10 NOTE — Discharge Instructions (Signed)

## 2017-10-10 NOTE — MAU Provider Note (Signed)
History     CSN: [id number]  Arrival date and time: 10/10/17 [id number]   First Provider Initiated Contact with Patient 10/10/17 1911      Chief Complaint  Patient presents with  . Nasal Congestion   G5P3013 @22 .0 wks here with nasal congestion and infrequent voiding. Both sx started 4 days ago. No cough, sore throat, body aches.  She has been around other family members with a cold. She started using Flonase today and had severe burning. Reports urinating only twice in hrs. Endorses dysuria but not hematuria or urgency. Admits to poor water intake, drinks mostly juice. Denies VB or discharge. Reports good FM.   OB History    Gravida Para Term Preterm AB Living   5 3 3  0 1 3   SAB TAB Ectopic Multiple Live Births   0 1     3      Past Medical History:  Diagnosis Date  . Chronic kidney disease   . Herpes   . Pyelonephritis affecting pregnancy 2013  . Trichomoniasis 09/18/2013   09/18/13 Phone number disconnected > requested letter to be sent or inform at next visit     Past Surgical History:  Procedure Laterality Date  . CESAREAN SECTION  2007  . CESAREAN SECTION  02/29/2012   Procedure: CESAREAN SECTION;  Surgeon: Tilda BurrowJohn V Ferguson, MD;  Location: [facility];  Service: Gynecology;  Laterality: N/A;  Repeat   . CESAREAN SECTION N/A 10/23/2013   Procedure: CESAREAN SECTION;  Surgeon: Reva Boresanya S Pratt, MD;  Location: [facility];  Service: Obstetrics;  Laterality: N/A;  Small abrasion located on the left lower lateral of the abdomen.  Dermabond applied to the abrasion by Dr. Reola CalkinsBeck.  See OR note.  . INDUCED ABORTION      Family History  Problem Relation Age of Onset  . Hypertension Mother   . Hypertension Maternal Grandfather     Social History   Tobacco Use  . Smoking status: Former Smoker    Types: Cigars    Last attempt to quit: 08/13/2010    Years since quitting: 7.1  . Smokeless tobacco: Never Used  Substance Use Topics  . Alcohol use: No    Frequency: Never    Comment: ocassionally   . Drug use: No    Allergies:  Allergies  Allergen Reactions  . Apple Itching and Swelling    Red Apples  . Banana Itching and Swelling    Medications Prior to Admission  Medication Sig Dispense Refill Last Dose  . prochlorperazine (COMPAZINE) 10 MG tablet Take 1 tablet (10 mg total) every 6 (six) hours as needed by mouth for nausea or vomiting (1st line). (Patient not taking: Reported on 08/28/2017) 30 tablet 2 Not Taking at Unknown time  . promethazine (PHENERGAN) 25 MG suppository Place 1 suppository (25 mg total) every 6 (six) hours as needed rectally for nausea or vomiting (2nd line). (Patient not taking: Reported on 08/28/2017) 12 each 0 Not Taking at Unknown time  . ranitidine (ZANTAC) 150 MG tablet Take 1 tablet (150 mg total) 2 (two) times daily by mouth. (Patient not taking: Reported on 06/29/2017) 60 tablet 0 Not Taking at Unknown time    Review of Systems  Constitutional: Negative for chills and fever.  HENT: Positive for congestion, ear pain, rhinorrhea, sinus pressure, sinus pain, sneezing and trouble swallowing. Negative for sore throat. Hearing loss: left.   Respiratory: Negative for cough, chest tightness and shortness of breath.   Gastrointestinal: Negative for abdominal pain, constipation,  diarrhea, nausea and vomiting.  Genitourinary: Positive for dysuria. Negative for frequency, hematuria, urgency, vaginal bleeding and vaginal discharge.   Physical Exam   Blood pressure 122/72, pulse 92, temperature 98.3 F (36.8 C), temperature source Oral, resp. rate 20, height 5\' 7"  (1.702 m), weight 284 lb (128.8 kg), last menstrual period 05/09/2017, SpO2 100 %, unknown if currently breastfeeding.  Physical Exam  Nursing note and vitals reviewed. Constitutional: She is oriented to person, place, and time. She appears well-developed and well-nourished. No distress.  HENT:  Head: Normocephalic and atraumatic.  Right Ear: Hearing, tympanic membrane, external ear and ear canal  normal.  Left Ear: Hearing, tympanic membrane, external ear and ear canal normal.  Nose: Right sinus exhibits maxillary sinus tenderness. Right sinus exhibits no frontal sinus tenderness. Left sinus exhibits maxillary sinus tenderness. Left sinus exhibits no frontal sinus tenderness.  Mouth/Throat: Uvula is midline, oropharynx is clear and moist and mucous membranes are normal. No oropharyngeal exudate, posterior oropharyngeal edema, posterior oropharyngeal erythema or tonsillar abscesses.  Neck: Normal range of motion.  Cardiovascular: Normal rate, regular rhythm and normal heart sounds.  Respiratory: Effort normal and breath sounds normal. No respiratory distress. She has no wheezes. She has no rales.  Musculoskeletal: Normal range of motion.  Neurological: She is alert and oriented to person, place, and time.  Skin: Skin is warm and dry.  Psychiatric: She has a normal mood and affect.  FHT 148  Results for orders placed or performed during the hospital encounter of 10/10/17 (from the past 24 hour(s))  Urinalysis, Routine w reflex microscopic     Status: Abnormal   Collection Time: 10/10/17  6:30 PM  Result Value Ref Range   Color, Urine YELLOW YELLOW   APPearance HAZY (A) CLEAR   Specific Gravity, Urine 1.024 1.005 - 1.030   pH 7.0 5.0 - 8.0   Glucose, UA NEGATIVE NEGATIVE mg/dL   Hgb urine dipstick NEGATIVE NEGATIVE   Bilirubin Urine NEGATIVE NEGATIVE   Ketones, ur NEGATIVE NEGATIVE mg/dL   Protein, ur NEGATIVE NEGATIVE mg/dL   Nitrite NEGATIVE NEGATIVE   Leukocytes, UA TRACE (A) NEGATIVE   RBC / HPF 0-5 0 - 5 RBC/hpf   WBC, UA 0-5 0 - 5 WBC/hpf   Bacteria, UA FEW (A) NONE SEEN   Squamous Epithelial / LPF 6-30 (A) NONE SEEN   Mucus PRESENT    Amorphous Crystal PRESENT    MAU Course  Procedures  MDM Labs ordered and reviewed. No evidence of UTI. Needs to hydrate more. Will treat for sinusitis. Stable for discharge home. Encouraged to start care asap.  Assessment and Plan    1. [redacted] weeks gestation of pregnancy   2. Acute non-recurrent maxillary sinusitis    Discharge home Follow up in [facility] to start care Rx Augmentin Rx Sudafed Rx saline nasal spray  Allergies as of 10/10/2017      Reactions   Apple Itching, Swelling   Red Apples   Banana Itching, Swelling      Medication List    TAKE these medications   amoxicillin-clavulanate 875-125 MG tablet Commonly known as:  AUGMENTIN Take 1 tablet by mouth 2 (two) times daily for 7 days.   prochlorperazine 10 MG tablet Commonly known as:  COMPAZINE Take 1 tablet (10 mg total) every 6 (six) hours as needed by mouth for nausea or vomiting (1st line).   promethazine 25 MG suppository Commonly known as:  PHENERGAN Place 1 suppository (25 mg total) every 6 (six) hours as needed rectally  for nausea or vomiting (2nd line).   pseudoephedrine 30 MG tablet Commonly known as:  SUDAFED Take 1 tablet (30 mg total) by mouth every 6 (six) hours as needed for congestion.   ranitidine 150 MG tablet Commonly known as:  ZANTAC Take 1 tablet (150 mg total) 2 (two) times daily by mouth.   sodium chloride 0.65 % Soln nasal spray Commonly known as:  OCEAN Place 1 spray into both nostrils as needed for congestion.      Donette Larry, CNM 10/10/2017, 7:17 PM

## 2017-10-10 NOTE — MAU Note (Signed)
Pt states everyone in the house has been sick and had fevers. She's not sure if anyone has had the flu

## 2017-10-10 NOTE — MAU Note (Signed)
PT SAYS  ON Sunday  SHE COULD NOT BREATHE GOOD -   TODAY TOOK OTC  SPRAY  AND  PILLS -  FEELS  WORSE.  GETS [facility]-  IN CLINIC.      WHEN VOIDS-  BURNING , VOIDS 1-2 X A/ DAY  . .. NO PAIN, NO COUGH , NO CHEST CONGESTION

## 2017-10-28 ENCOUNTER — Encounter (HOSPITAL_COMMUNITY): Payer: Self-pay | Admitting: *Deleted

## 2017-10-28 ENCOUNTER — Inpatient Hospital Stay (HOSPITAL_COMMUNITY)
Admission: AD | Admit: 2017-10-28 | Discharge: 2017-10-29 | Disposition: A | Discharge status: Home / Self Care | Payer: Medicaid Other | Source: Ambulatory Visit | Attending: Obstetrics & Gynecology | Admitting: Obstetrics & Gynecology

## 2017-10-28 DIAGNOSIS — K529 Noninfective gastroenteritis and colitis, unspecified: Secondary | ICD-10-CM | POA: Diagnosis not present

## 2017-10-28 DIAGNOSIS — O219 Vomiting of pregnancy, unspecified: Secondary | ICD-10-CM | POA: Diagnosis not present

## 2017-10-28 DIAGNOSIS — Z87891 Personal history of nicotine dependence: Secondary | ICD-10-CM | POA: Insufficient documentation

## 2017-10-28 DIAGNOSIS — Z3A24 24 weeks gestation of pregnancy: Secondary | ICD-10-CM | POA: Diagnosis not present

## 2017-10-28 DIAGNOSIS — R109 Unspecified abdominal pain: Secondary | ICD-10-CM | POA: Diagnosis present

## 2017-10-28 DIAGNOSIS — O26891 Other specified pregnancy related conditions, first trimester: Secondary | ICD-10-CM | POA: Diagnosis not present

## 2017-10-28 NOTE — MAU Note (Signed)
PT  SAYS SHE STARTED VOMITING   TONIGHT.   HAS HAD ABD  - STARTED YESTERDAY-  .   NO DIARRHEA.  [facility]

## 2017-10-29 ENCOUNTER — Other Ambulatory Visit: Payer: Self-pay | Admitting: Obstetrics and Gynecology

## 2017-10-29 ENCOUNTER — Other Ambulatory Visit: Payer: Self-pay | Admitting: Student

## 2017-10-29 DIAGNOSIS — O26899 Other specified pregnancy related conditions, unspecified trimester: Secondary | ICD-10-CM

## 2017-10-29 DIAGNOSIS — O26891 Other specified pregnancy related conditions, first trimester: Secondary | ICD-10-CM

## 2017-10-29 DIAGNOSIS — Z3A24 24 weeks gestation of pregnancy: Secondary | ICD-10-CM

## 2017-10-29 DIAGNOSIS — O219 Vomiting of pregnancy, unspecified: Secondary | ICD-10-CM

## 2017-10-29 DIAGNOSIS — R109 Unspecified abdominal pain: Principal | ICD-10-CM

## 2017-10-29 LAB — URINALYSIS, ROUTINE W REFLEX MICROSCOPIC
Bilirubin Urine: NEGATIVE
Glucose, UA: NEGATIVE mg/dL
Hgb urine dipstick: NEGATIVE
Ketones, ur: 20 mg/dL — AB
Leukocytes, UA: NEGATIVE
Nitrite: NEGATIVE
PROTEIN: 30 mg/dL — AB
SPECIFIC GRAVITY, URINE: 1.03 (ref 1.005–1.030)
pH: 5 (ref 5.0–8.0)

## 2017-10-29 LAB — COMPREHENSIVE METABOLIC PANEL
ALBUMIN: 3 g/dL — AB (ref 3.5–5.0)
ALK PHOS: 62 U/L (ref 38–126)
ALT: 13 U/L — AB (ref 14–54)
AST: 24 U/L (ref 15–41)
Anion gap: 10 (ref 5–15)
BILIRUBIN TOTAL: 0.6 mg/dL (ref 0.3–1.2)
BUN: 7 mg/dL (ref 6–20)
CO2: 21 mmol/L — ABNORMAL LOW (ref 22–32)
CREATININE: 0.59 mg/dL (ref 0.44–1.00)
Calcium: 8.7 mg/dL — ABNORMAL LOW (ref 8.9–10.3)
Chloride: 104 mmol/L (ref 101–111)
GFR calc Af Amer: >60 mL/min (ref >60)
GLUCOSE: 89 mg/dL (ref 65–99)
Potassium: 4.3 mmol/L (ref 3.5–5.1)
Sodium: 135 mmol/L (ref 135–145)
TOTAL PROTEIN: 7 g/dL (ref 6.5–8.1)

## 2017-10-29 LAB — CBC
HEMATOCRIT: 37.5 % (ref 36.0–46.0)
HEMOGLOBIN: 12.5 g/dL (ref 12.0–15.0)
MCH: 28.8 pg (ref 26.0–34.0)
MCHC: 33.3 g/dL (ref 30.0–36.0)
MCV: 86.4 fL (ref 78.0–100.0)
Platelets: 308 10*3/uL (ref 150–400)
RBC: 4.34 MIL/uL (ref 3.87–5.11)
RDW: 14.9 % (ref 11.5–15.5)
WBC: 13.7 10*3/uL — AB (ref 4.0–10.5)

## 2017-10-29 LAB — URINE CYTOLOGY ANCILLARY ONLY
CHLAMYDIA, DNA PROBE: NEGATIVE
Neisseria Gonorrhea: NEGATIVE

## 2017-10-29 MED ORDER — LACTATED RINGERS IV BOLUS (SEPSIS)
500.0000 mL | Freq: Once | INTRAVENOUS | Status: AC
  Administered 2017-10-29: 500 mL via INTRAVENOUS

## 2017-10-29 MED ORDER — ONDANSETRON HCL 8 MG PO TABS: 8.0000 mg | ORAL_TABLET | Freq: Three times a day (TID) | ORAL | 0 refills | Status: DC | PRN

## 2017-10-29 MED ORDER — ONDANSETRON 8 MG PO TBDP
8.0000 mg | ORAL_TABLET | Freq: Once | ORAL | Status: AC
  Administered 2017-10-29: 8 mg via ORAL
  Filled 2017-10-29: qty 1

## 2017-10-29 MED ORDER — PROMETHAZINE HCL 25 MG/ML IJ SOLN
25.0000 mg | Freq: Once | INTRAMUSCULAR | Status: AC
  Administered 2017-10-29: 25 mg via INTRAVENOUS
  Filled 2017-10-29: qty 1

## 2017-10-29 NOTE — MAU Note (Signed)
UPON D/C - VOMITED SMALL AMT - CALLED KATHERINE, CNM- RECEIVED ORDERS

## 2017-10-29 NOTE — Discharge Instructions (Signed)
Food Poisoning Food poisoning is an illness that is caused by eating or drinking contaminated foods or drinks. In most cases, food poisoning is mild and lasts 1-2 days. However, some cases can be serious, especially for people who have weak body defense (immune) systems, older people, children and infants, and pregnant women. What are the causes? Foods can become contaminated with viruses, bacteria, parasites, mold, or chemicals as a result of:  Poor personal hygiene, such as poor hand washing practices.  Storing food improperly, such as not refrigerating raw meat.  Using unclean surfaces for serving, preparing, and storing food.  Cooking or eating with unclean utensils.  If contaminated food is eaten, viruses, bacteria, or parasites can harm the intestine. This often causes severe diarrhea. The most common causes of food poisoning include:  Viruses, such as: ? Norovirus. ? Rotavirus.  Bacteria, such as: ? Salmonella. ? Listeria. ? E. coli (Escherichia coli).  Parasites, such as: ? Giardia. ? Toxoplasmosis.  What are the signs or symptoms? Symptoms may take several hours to appear after you consume contaminated food or drink. Symptoms include:  Nausea.  Vomiting.  Cramping.  Diarrhea.  Fever and chills.  Muscle aches.  Dehydration. Dehydration can cause you to be tired and thirsty, have a dry mouth, and urinate less frequently.  How is this diagnosed? Your health care provider can diagnose food poisoning with a medical history and physical exam. This will include asking you what you have recently eaten. You may also have tests, including:  Blood tests.  Stool tests.  How is this treated? Treatment focuses on relieving your symptoms and making sure that you are hydrated. You may also be given medicines. In severe cases, hospitalization may be required and you may need to receive fluids through an IV tube. Follow these instructions at home: Eating and  drinking   Drink enough fluids to keep your urine clear or pale yellow. You may need to drink small amounts of clear liquids frequently.  Avoid milk, caffeine, and alcohol.  Ask your health care provider for specific rehydration instructions.  Eat small, frequent meals rather than large meals. Medicines  Take over-the-counter and prescription medicines only as told by your health care provider. Ask your health care provider if you should continue to take any of your regular prescribed and over-the-counter medicines.  If you were prescribed an antibiotic medicine, take it as told by your health care provider. Do not stop taking the antibiotic even if you start to feel better. General instructions  Wash your hands thoroughly before you prepare food and after you go to the bathroom (use the toilet). Make sure people who live with you also wash their hands often.  Clean surfaces that you touch with a product that contains chlorine bleach.  Keep all follow-up visits as told by your health care provider. This is important. How is this prevented?  Wash your hands, food preparation surfaces, and utensils thoroughly before and after you handle raw foods.  Use separate food preparation surfaces and storage spaces for raw meat and for fruits and vegetables.  Keep refrigerated foods colder than 40F (5C).  Serve hot foods immediately or keep them heated above 140F (60C).  Store dry foods in cool, dry spaces away from excess heat or moisture. Throw out any foods that do not smell right or are in cans that are bulging.  Follow approved canning procedures.  Heat canned foods thoroughly before you taste them.  Drink bottled or sterile water when you travel.   Get help right away if: Call 911 or go to the emergency room if:  You have difficulty breathing, swallowing, talking, or moving.  You develop blurred vision.  You cannot eat or drink without vomiting.  You faint.  Your eyes  turn yellow.  Your vomiting or diarrhea is persistent.  Abdominal pain develops, increases, or localizes in one small area.  You have a fever.  You have blood or mucus in your stools, or your stools look dark black and tarry.  You have signs of dehydration, such as: ? Dark urine, very little urine, or no urine. ? Cracked lips. ? Not making tears while crying. ? Dry mouth. ? Sunken eyes. ? Sleepiness. ? Weakness. ? Dizziness.  This information is not intended to replace advice given to you by your health care provider. Make sure you discuss any questions you have with your health care provider. Document Released: 04/27/2004 Document Revised: 12/27/2015 Document Reviewed: 01/31/2015 Elsevier Interactive Patient Education  2018 [employer]  

## 2017-10-29 NOTE — MAU Provider Note (Signed)
Patient Jasmine Hudson is a 27 y.o. [phone] At 2272w5d here with complaints of abdominal pain and vomiting. She denies bleeding, discharge, dysuria, low back pain, or other ob-gyn complaint.   Her history is significant for no prenatal care in this pregnancy, as well as previous c-section, herpes, STIs, and pyelonephritis.    She has had 7 MAU visits in 6 months.  History     CSN: [id number]  Arrival date and time: 10/28/17 2329   First Provider Initiated Contact with Patient 10/29/17 0049      Chief Complaint  Patient presents with  . Emesis   Abdominal Pain  This is a new problem. The current episode started today. The problem occurs constantly. The problem has been unchanged. The pain is located in the generalized abdominal region. The pain is at a severity of 6/10. The quality of the pain is cramping. The abdominal pain does not radiate. Associated symptoms include nausea and vomiting. Pertinent negatives include no constipation, diarrhea, dysuria or headaches. Nothing aggravates the pain. The pain is relieved by nothing. She has tried nothing for the symptoms.   Patient says that her stomach was hurting when she woke up this morning. It felt like a cramp all over her stomach. She went to work. She had a sub and some chicken for lunch, and then started having vomiting this evening. She says her stomach hurts every 10 minutes for 2 minutes at a time.  She thinks it could be something she ate.  OB History    Gravida Para Term Preterm AB Living   5 3 3  0 1 3   SAB TAB Ectopic Multiple Live Births   0 1     3      Past Medical History:  Diagnosis Date  . Chronic kidney disease   . Herpes   . Pyelonephritis affecting pregnancy 2013  . Trichomoniasis 09/18/2013   09/18/13 Phone number disconnected > requested letter to be sent or inform at next visit     Past Surgical History:  Procedure Laterality Date  . CESAREAN SECTION  2007  . CESAREAN SECTION  02/29/2012   Procedure: CESAREAN  SECTION;  Surgeon: Tilda BurrowJohn V Ferguson, MD;  Location: [facility];  Service: Gynecology;  Laterality: N/A;  Repeat   . CESAREAN SECTION N/A 10/23/2013   Procedure: CESAREAN SECTION;  Surgeon: Reva Boresanya S Pratt, MD;  Location: [facility];  Service: Obstetrics;  Laterality: N/A;  Small abrasion located on the left lower lateral of the abdomen.  Dermabond applied to the abrasion by Dr. Reola CalkinsBeck.  See OR note.  . INDUCED ABORTION      Family History  Problem Relation Age of Onset  . Hypertension Mother   . Hypertension Maternal Grandfather     Social History   Tobacco Use  . Smoking status: Former Smoker    Types: Cigars    Last attempt to quit: 08/13/2010    Years since quitting: 7.2  . Smokeless tobacco: Never Used  Substance Use Topics  . Alcohol use: No    Frequency: Never    Comment: ocassionally  . Drug use: No    Allergies:  Allergies  Allergen Reactions  . Apple Itching and Swelling    Red Apples  . Banana Itching and Swelling    Medications Prior to Admission  Medication Sig Dispense Refill Last Dose  . prochlorperazine (COMPAZINE) 10 MG tablet Take 1 tablet (10 mg total) every 6 (six) hours as needed by mouth for nausea or vomiting (  1st line). (Patient not taking: Reported on 08/28/2017) 30 tablet 2 Not Taking at Unknown time  . promethazine (PHENERGAN) 25 MG suppository Place 1 suppository (25 mg total) every 6 (six) hours as needed rectally for nausea or vomiting (2nd line). (Patient not taking: Reported on 08/28/2017) 12 each 0 Not Taking at Unknown time  . pseudoephedrine (SUDAFED) 30 MG tablet Take 1 tablet (30 mg total) by mouth every 6 (six) hours as needed for congestion. 24 tablet 0   . ranitidine (ZANTAC) 150 MG tablet Take 1 tablet (150 mg total) 2 (two) times daily by mouth. (Patient not taking: Reported on 06/29/2017) 60 tablet 0 Not Taking at Unknown time  . sodium chloride (OCEAN) 0.65 % SOLN nasal spray Place 1 spray into both nostrils as needed for congestion. 1 Bottle 0      Review of Systems  Gastrointestinal: Positive for abdominal pain, nausea and vomiting. Negative for constipation and diarrhea.  Genitourinary: Negative for dysuria.  Neurological: Negative for headaches.   Physical Exam   Blood pressure 130/61, pulse 92, temperature 98.4 F (36.9 C), temperature source Oral, resp. rate 20, height 5\' 7"  (1.702 m), weight 285 lb 8 oz (129.5 kg), last menstrual period 05/09/2017, unknown if currently breastfeeding.  Physical Exam  Constitutional: She appears well-developed.  HENT:  Head: Normocephalic.  Eyes: Pupils are equal, round, and reactive to light.  Neck: Normal range of motion.  GI: Soft.  Neurological: She is alert.  Skin: Skin is warm and dry.  Psychiatric: She has a normal mood and affect.  Abdomen is soft, non-tender; cervix is long, closed and thick.   MAU Course  Procedures  MDM -NST: 150 bpm, mod var, 10 by 10 acels, no decels and uterine irratability. Patient is extremely difficult to monitor due to constant movement and discomfort with being monitored, as well as maternal habitus.  -Patient had IV fluids and phenergan IV, dozed on and off during MAU stay.  -Patient had one episode of vomiting after arriving in MAU, and then did not vomit until an hour after her phenergan. She failed her sprite PO challenge but then passed after receiving Zofran ODT.  -Cervix was long and closed while in MAU, no indication of pre-term labor.  -gc chlamydia probe pending -urine culture pending Assessment and Plan   1. Gastroenteritis    2. Discussed with patient and partner that patient most likely has a virus and that she will need to eat carefully and slowly to avoid repetitive vomiting.  3. Patient should [facility] for this Friday.  4. Patient will return to MAU if her symptoms were to change or worsen.  5. RX for Zofran given; patient stable for discharge.   Charlesetta Garibaldi Kooistra 10/29/2017, 3:02 AM

## 2017-10-30 LAB — CULTURE, OB URINE

## 2017-11-04 ENCOUNTER — Ambulatory Visit (INDEPENDENT_AMBULATORY_CARE_PROVIDER_SITE_OTHER): Payer: Self-pay | Admitting: Advanced Practice Midwife

## 2017-11-04 ENCOUNTER — Encounter: Payer: Self-pay | Admitting: Advanced Practice Midwife

## 2017-11-04 ENCOUNTER — Encounter: Payer: Self-pay | Admitting: *Deleted

## 2017-11-04 VITALS — BP 90/60 | HR 89 | Wt 283.7 lb

## 2017-11-04 DIAGNOSIS — N390 Urinary tract infection, site not specified: Secondary | ICD-10-CM

## 2017-11-04 DIAGNOSIS — O9989 Other specified diseases and conditions complicating pregnancy, childbirth and the puerperium: Secondary | ICD-10-CM

## 2017-11-04 DIAGNOSIS — Z124 Encounter for screening for malignant neoplasm of cervix: Secondary | ICD-10-CM | POA: Diagnosis not present

## 2017-11-04 DIAGNOSIS — Z3480 Encounter for supervision of other normal pregnancy, unspecified trimester: Secondary | ICD-10-CM

## 2017-11-04 DIAGNOSIS — Z283 Underimmunization status: Secondary | ICD-10-CM | POA: Diagnosis not present

## 2017-11-04 DIAGNOSIS — Z3482 Encounter for supervision of other normal pregnancy, second trimester: Secondary | ICD-10-CM | POA: Diagnosis not present

## 2017-11-04 DIAGNOSIS — Z3481 Encounter for supervision of other normal pregnancy, first trimester: Secondary | ICD-10-CM

## 2017-11-04 DIAGNOSIS — Z3400 Encounter for supervision of normal first pregnancy, unspecified trimester: Secondary | ICD-10-CM

## 2017-11-04 DIAGNOSIS — O09899 Supervision of other high risk pregnancies, unspecified trimester: Secondary | ICD-10-CM

## 2017-11-04 LAB — POCT URINALYSIS DIP (DEVICE)
Glucose, UA: NEGATIVE mg/dL
Hgb urine dipstick: NEGATIVE
Ketones, ur: >=160 mg/dL — AB
LEUKOCYTES UA: NEGATIVE
NITRITE: NEGATIVE
PH: 6 (ref 5.0–8.0)
Protein, ur: 30 mg/dL — AB
Specific Gravity, Urine: >=1.03 (ref 1.005–1.030)
UROBILINOGEN UA: 0.2 mg/dL (ref 0.0–1.0)

## 2017-11-04 NOTE — Progress Notes (Signed)
  Subjective:    Jasmine Hudson is being seen today for her first obstetrical visit.  This is not a planned pregnancy. She is at 6882w4d gestation. Her obstetrical history is significant for previous c-section x3 . Relationship with FOB: significant other, not living together. Patient does intend to breast feed. Pregnancy history fully reviewed.  Patient reports no complaints.  Review of Systems:   Review of Systems  All other systems reviewed and are negative.   Objective:     LMP 05/09/2017  Physical Exam  Nursing note and vitals reviewed. Constitutional: She is oriented to person, place, and time. She appears well-developed and well-nourished. No distress.  HENT:  Head: Normocephalic.  Cardiovascular: Normal rate.  Respiratory: Effort normal.  GI: Soft. There is no tenderness.  Genitourinary:  Genitourinary Comments:  External: no lesion Vagina: small amount of white discharge Cervix: pink, smooth, closed/thick  Uterus: AGA   Neurological: She is alert and oriented to person, place, and time.  Skin: Skin is warm and dry.  Psychiatric: She has a normal mood and affect.    Maternal Exam:  Abdomen: Fundal height is 25.       Fetal Exam Fetal Monitor Review: Mode: hand-held doppler probe.   Baseline rate: 154.         Assessment:    Pregnancy: [id number] Patient Active Problem List   Diagnosis Date Noted  . Pyelonephritis affecting pregnancy in first trimester 06/29/2017  . Supervision of normal intrauterine pregnancy in multigravida in first trimester 06/29/2017  . Previous cesarean section 09/10/2013  . Herpes 11/22/2011       Plan:     Initial labs drawn. Panorama  Prenatal vitamins. Problem list reviewed and updated. AFP3 discussed: too late. Role of ultrasound in pregnancy discussed; fetal survey: requested. Amniocentesis discussed: not indicated. Follow up in 3 weeks. 50% of 45 min visit spent on counseling and coordination of care.   If UC  isnegative will send in rx for macrodantin per recommendation when she was inpatient.     Thressa ShellerHeather Matrice Herro 11/04/2017

## 2017-11-04 NOTE — Progress Notes (Signed)
Declined flu vaccine  New OB packet given

## 2017-11-05 ENCOUNTER — Other Ambulatory Visit (HOSPITAL_COMMUNITY): Payer: Self-pay

## 2017-11-05 LAB — CYTOLOGY - PAP: DIAGNOSIS: NEGATIVE

## 2017-11-06 LAB — CULTURE, OB URINE

## 2017-11-06 LAB — URINE CULTURE, OB REFLEX

## 2017-11-11 ENCOUNTER — Encounter: Payer: Self-pay | Admitting: *Deleted

## 2017-11-11 DIAGNOSIS — O9989 Other specified diseases and conditions complicating pregnancy, childbirth and the puerperium: Secondary | ICD-10-CM

## 2017-11-11 DIAGNOSIS — N390 Urinary tract infection, site not specified: Secondary | ICD-10-CM | POA: Insufficient documentation

## 2017-11-11 DIAGNOSIS — O09899 Supervision of other high risk pregnancies, unspecified trimester: Secondary | ICD-10-CM | POA: Insufficient documentation

## 2017-11-11 DIAGNOSIS — Z283 Underimmunization status: Secondary | ICD-10-CM | POA: Insufficient documentation

## 2017-11-11 LAB — OBSTETRIC PANEL, INCLUDING HIV
ANTIBODY SCREEN: NEGATIVE
Basophils Absolute: 0 10*3/uL (ref 0.0–0.2)
Basos: 0 %
EOS (ABSOLUTE): 0.3 10*3/uL (ref 0.0–0.4)
EOS: 2 %
HEMOGLOBIN: 12.2 g/dL (ref 11.1–15.9)
HIV Screen 4th Generation wRfx: NONREACTIVE
Hematocrit: 35.9 % (ref 34.0–46.6)
Hepatitis B Surface Ag: NEGATIVE
IMMATURE GRANULOCYTES: 0 %
Immature Grans (Abs): 0 10*3/uL (ref 0.0–0.1)
LYMPHS ABS: 2.7 10*3/uL (ref 0.7–3.1)
Lymphs: 26 %
MCH: 28.8 pg (ref 26.6–33.0)
MCHC: 34 g/dL (ref 31.5–35.7)
MCV: 85 fL (ref 79–97)
MONOS ABS: 0.6 10*3/uL (ref 0.1–0.9)
Monocytes: 6 %
NEUTROS PCT: 66 %
Neutrophils Absolute: 6.8 10*3/uL (ref 1.4–7.0)
Platelets: 342 10*3/uL (ref 150–379)
RBC: 4.24 x10E6/uL (ref 3.77–5.28)
RDW: 14.8 % (ref 12.3–15.4)
RH TYPE: POSITIVE
RPR Ser Ql: NONREACTIVE
Rubella Antibodies, IGG: <0.9 index — ABNORMAL LOW (ref >0.99)
WBC: 10.5 10*3/uL (ref 3.4–10.8)

## 2017-11-11 LAB — HEMOGLOBINOPATHY EVALUATION
HEMOGLOBIN A2 QUANTITATION: 2.4 % (ref 1.8–3.2)
HGB C: 0 %
HGB S: 0 %
HGB VARIANT: 0 %
Hemoglobin F Quantitation: 0 % (ref 0.0–2.0)
Hgb A: 97.6 % (ref 96.4–98.8)

## 2017-11-11 LAB — SMN1 COPY NUMBER ANALYSIS (SMA CARRIER SCREENING)

## 2017-11-11 LAB — HEMOGLOBIN A1C
Est. average glucose Bld gHb Est-mCnc: 103 mg/dL
HEMOGLOBIN A1C: 5.2 % (ref 4.8–5.6)

## 2017-11-11 LAB — CYSTIC FIBROSIS MUTATION 97: Interpretation: NOT DETECTED

## 2017-11-11 MED ORDER — NITROFURANTOIN MACROCRYSTAL 100 MG PO CAPS: 100.0000 mg | ORAL_CAPSULE | Freq: Every day | ORAL | 3 refills | Status: DC

## 2017-11-11 NOTE — Addendum Note (Signed)
Addended by: Thressa ShellerHOGAN, HEATHER D on: 11/11/2017 01:28 PM   Modules accepted: Orders

## 2017-11-12 ENCOUNTER — Telehealth: Payer: Self-pay

## 2017-11-12 NOTE — Telephone Encounter (Addendum)
-----   Message from Heather D Hogan, CNM sent at 11/11/2017  1:29 PM EDT ----- Patient with negative UTI the plan was for her to start macrodantin QHS per Dr. Ferguson. I have sent the RX to her pharmacy, but can someone please call to let her know to start taking it. 

## 2017-11-12 NOTE — Telephone Encounter (Signed)
-----   Message from Armando ReichertHeather D Hogan, CNM sent at 11/11/2017  1:29 PM EDT ----- Patient with negative UTI the plan was for her to start macrodantin QHS per Dr. Emelda FearFerguson. I have sent the RX to her pharmacy, but can someone please call to let her know to start taking it.

## 2017-11-12 NOTE — Telephone Encounter (Signed)
Called patient- no answer or voicemail to leave a message. 

## 2017-11-14 ENCOUNTER — Other Ambulatory Visit (HOSPITAL_COMMUNITY): Payer: Self-pay

## 2017-11-15 ENCOUNTER — Encounter (HOSPITAL_COMMUNITY): Payer: Self-pay | Admitting: *Deleted

## 2017-11-20 ENCOUNTER — Encounter (HOSPITAL_COMMUNITY): Payer: Self-pay

## 2017-11-20 ENCOUNTER — Other Ambulatory Visit (HOSPITAL_COMMUNITY): Payer: Self-pay | Admitting: *Deleted

## 2017-11-20 ENCOUNTER — Ambulatory Visit (HOSPITAL_COMMUNITY)
Admission: RE | Admit: 2017-11-20 | Discharge: 2017-11-20 | Disposition: A | Discharge status: Home / Self Care | Payer: Medicaid Other | Source: Ambulatory Visit | Attending: Advanced Practice Midwife | Admitting: Advanced Practice Midwife

## 2017-11-20 DIAGNOSIS — N390 Urinary tract infection, site not specified: Secondary | ICD-10-CM

## 2017-11-20 DIAGNOSIS — Z3689 Encounter for other specified antenatal screening: Secondary | ICD-10-CM | POA: Insufficient documentation

## 2017-11-20 DIAGNOSIS — O0933 Supervision of pregnancy with insufficient antenatal care, third trimester: Secondary | ICD-10-CM | POA: Diagnosis not present

## 2017-11-20 DIAGNOSIS — O99213 Obesity complicating pregnancy, third trimester: Secondary | ICD-10-CM | POA: Diagnosis present

## 2017-11-20 DIAGNOSIS — Z3A28 28 weeks gestation of pregnancy: Secondary | ICD-10-CM | POA: Insufficient documentation

## 2017-11-20 DIAGNOSIS — O9989 Other specified diseases and conditions complicating pregnancy, childbirth and the puerperium: Secondary | ICD-10-CM

## 2017-11-20 DIAGNOSIS — Z362 Encounter for other antenatal screening follow-up: Secondary | ICD-10-CM

## 2017-11-20 DIAGNOSIS — O34219 Maternal care for unspecified type scar from previous cesarean delivery: Secondary | ICD-10-CM | POA: Diagnosis not present

## 2017-11-20 DIAGNOSIS — Z2839 Other underimmunization status: Secondary | ICD-10-CM

## 2017-11-20 DIAGNOSIS — Z283 Underimmunization status: Secondary | ICD-10-CM

## 2017-11-20 DIAGNOSIS — Z3480 Encounter for supervision of other normal pregnancy, unspecified trimester: Secondary | ICD-10-CM

## 2017-11-28 ENCOUNTER — Encounter: Payer: Self-pay | Admitting: Student

## 2017-11-28 ENCOUNTER — Other Ambulatory Visit: Payer: Self-pay

## 2017-11-28 ENCOUNTER — Telehealth: Payer: Self-pay | Admitting: General Practice

## 2017-11-28 NOTE — Telephone Encounter (Signed)
Patient came to window wanting to reschedule lab appt and OB appointment.  I informed patient that I will schedule first available and if she has any complications to visit our Maternity Admissions Unit.  Patient voiced understanding.

## 2017-12-02 ENCOUNTER — Other Ambulatory Visit: Payer: Self-pay

## 2017-12-02 DIAGNOSIS — Z3481 Encounter for supervision of other normal pregnancy, first trimester: Secondary | ICD-10-CM

## 2017-12-03 LAB — HIV ANTIBODY (ROUTINE TESTING W REFLEX): HIV SCREEN 4TH GENERATION: NONREACTIVE

## 2017-12-03 LAB — CBC
HEMOGLOBIN: 11.6 g/dL (ref 11.1–15.9)
Hematocrit: 35 % (ref 34.0–46.6)
MCH: 28.5 pg (ref 26.6–33.0)
MCHC: 33.1 g/dL (ref 31.5–35.7)
MCV: 86 fL (ref 79–97)
Platelets: 310 10*3/uL (ref 150–379)
RBC: 4.07 x10E6/uL (ref 3.77–5.28)
RDW: 15.6 % — ABNORMAL HIGH (ref 12.3–15.4)
WBC: 8.8 10*3/uL (ref 3.4–10.8)

## 2017-12-03 LAB — RPR: RPR: NONREACTIVE

## 2017-12-03 LAB — GLUCOSE TOLERANCE, 2 HOURS W/ 1HR
GLUCOSE, 1 HOUR: 115 mg/dL (ref 65–179)
GLUCOSE, FASTING: 78 mg/dL (ref 65–91)
Glucose, 2 hour: 78 mg/dL (ref 65–152)

## 2017-12-03 NOTE — Telephone Encounter (Signed)
LM for pt stating that I am calling in regards to following up with medication management to please give office a call.

## 2017-12-18 ENCOUNTER — Encounter: Payer: Self-pay | Admitting: Nurse Practitioner

## 2017-12-18 ENCOUNTER — Ambulatory Visit (HOSPITAL_COMMUNITY)
Admission: RE | Admit: 2017-12-18 | Discharge: 2017-12-18 | Disposition: A | Discharge status: Home / Self Care | Payer: Medicaid Other | Source: Ambulatory Visit | Attending: Advanced Practice Midwife | Admitting: Advanced Practice Midwife

## 2017-12-18 ENCOUNTER — Telehealth: Payer: Self-pay | Admitting: General Practice

## 2017-12-18 ENCOUNTER — Other Ambulatory Visit (HOSPITAL_COMMUNITY): Payer: Self-pay | Admitting: Maternal and Fetal Medicine

## 2017-12-18 ENCOUNTER — Encounter (HOSPITAL_COMMUNITY): Payer: Self-pay

## 2017-12-18 DIAGNOSIS — Z0489 Encounter for examination and observation for other specified reasons: Secondary | ICD-10-CM

## 2017-12-18 DIAGNOSIS — O359XX0 Maternal care for (suspected) fetal abnormality and damage, unspecified, not applicable or unspecified: Secondary | ICD-10-CM | POA: Diagnosis not present

## 2017-12-18 DIAGNOSIS — O09899 Supervision of other high risk pregnancies, unspecified trimester: Secondary | ICD-10-CM

## 2017-12-18 DIAGNOSIS — O34219 Maternal care for unspecified type scar from previous cesarean delivery: Secondary | ICD-10-CM | POA: Insufficient documentation

## 2017-12-18 DIAGNOSIS — Z3A32 32 weeks gestation of pregnancy: Secondary | ICD-10-CM | POA: Diagnosis not present

## 2017-12-18 DIAGNOSIS — O99213 Obesity complicating pregnancy, third trimester: Secondary | ICD-10-CM | POA: Insufficient documentation

## 2017-12-18 DIAGNOSIS — O9989 Other specified diseases and conditions complicating pregnancy, childbirth and the puerperium: Secondary | ICD-10-CM

## 2017-12-18 DIAGNOSIS — IMO0002 Reserved for concepts with insufficient information to code with codable children: Secondary | ICD-10-CM

## 2017-12-18 DIAGNOSIS — N390 Urinary tract infection, site not specified: Secondary | ICD-10-CM

## 2017-12-18 DIAGNOSIS — O0933 Supervision of pregnancy with insufficient antenatal care, third trimester: Secondary | ICD-10-CM | POA: Diagnosis not present

## 2017-12-18 DIAGNOSIS — Z283 Underimmunization status: Secondary | ICD-10-CM

## 2017-12-18 DIAGNOSIS — Z362 Encounter for other antenatal screening follow-up: Secondary | ICD-10-CM | POA: Insufficient documentation

## 2017-12-18 DIAGNOSIS — Z3A31 31 weeks gestation of pregnancy: Secondary | ICD-10-CM

## 2017-12-18 NOTE — Addendum Note (Signed)
Encounter addended by: Marcellina Millin, RTR on: 12/18/2017 4:12 PM  Actions taken: Imaging Exam ended

## 2017-12-18 NOTE — Telephone Encounter (Signed)
Patient missed LOB appt today.  Called patient via phone, but was unable to reach patient.  Patient was currently in MFM.  Called made to MFM to ask patient to come to clinic to reschedule appt.

## 2017-12-19 ENCOUNTER — Other Ambulatory Visit (HOSPITAL_COMMUNITY): Payer: Self-pay | Admitting: *Deleted

## 2017-12-19 DIAGNOSIS — Z362 Encounter for other antenatal screening follow-up: Secondary | ICD-10-CM

## 2017-12-25 ENCOUNTER — Ambulatory Visit (INDEPENDENT_AMBULATORY_CARE_PROVIDER_SITE_OTHER): Payer: Medicaid Other

## 2017-12-25 VITALS — BP 128/52 | HR 82 | Wt 285.6 lb

## 2017-12-25 DIAGNOSIS — Z3009 Encounter for other general counseling and advice on contraception: Secondary | ICD-10-CM

## 2017-12-25 DIAGNOSIS — N390 Urinary tract infection, site not specified: Secondary | ICD-10-CM

## 2017-12-25 DIAGNOSIS — Z3400 Encounter for supervision of normal first pregnancy, unspecified trimester: Secondary | ICD-10-CM

## 2017-12-25 DIAGNOSIS — Z98891 History of uterine scar from previous surgery: Secondary | ICD-10-CM

## 2017-12-25 NOTE — Patient Instructions (Signed)
Safe Medications in Pregnancy   Acne: Benzoyl Peroxide Salicylic Acid  Backache/Headache: Tylenol: 2 regular strength every 4 hours OR              2 Extra strength every 6 hours  Colds/Coughs/Allergies: Benadryl (alcohol free) 25 mg every 6 hours as needed Breath right strips Claritin Cepacol throat lozenges Chloraseptic throat spray Cold-Eeze- up to three times per day Cough drops, alcohol free Flonase (by prescription only) Guaifenesin Mucinex Robitussin DM (plain only, alcohol free) Saline nasal spray/drops Sudafed (pseudoephedrine) & Actifed ** use only after [redacted] weeks gestation and if you do not have high blood pressure Tylenol Vicks Vaporub Zinc lozenges Zyrtec   Constipation: Colace Ducolax suppositories Fleet enema Glycerin suppositories Metamucil Milk of magnesia Miralax Senokot Smooth move tea  Diarrhea: Kaopectate Imodium A-D  *NO pepto Bismol  Hemorrhoids: Anusol Anusol HC Preparation H Tucks  Indigestion: Tums Maalox Mylanta Zantac  Pepcid  Insomnia: Benadryl (alcohol free) 25mg every 6 hours as needed Tylenol PM Unisom, no Gelcaps  Leg Cramps: Tums MagGel  Nausea/Vomiting:  Bonine Dramamine Emetrol Ginger extract Sea bands Meclizine  Nausea medication to take during pregnancy:  Unisom (doxylamine succinate 25 mg tablets) Take one tablet daily at bedtime. If symptoms are not adequately controlled, the dose can be increased to a maximum recommended dose of two tablets daily (1/2 tablet in the morning, 1/2 tablet mid-afternoon and one at bedtime). Vitamin B6 100mg tablets. Take one tablet twice a day (up to 200 mg per day).  Skin Rashes: Aveeno products Benadryl cream or 25mg every 6 hours as needed Calamine Lotion 1% cortisone cream  Yeast infection: Gyne-lotrimin 7 Monistat 7   **If taking multiple medications, please check labels to avoid duplicating the same active ingredients **take medication as directed on  the label ** Do not exceed 4000 mg of tylenol in 24 hours **Do not take medications that contain aspirin or ibuprofen     Braxton Hicks Contractions Contractions of the uterus can occur throughout pregnancy, but they are not always a sign that you are in labor. You may have practice contractions called Braxton Hicks contractions. These false labor contractions are sometimes confused with true labor. What are Braxton Hicks contractions? Braxton Hicks contractions are tightening movements that occur in the muscles of the uterus before labor. Unlike true labor contractions, these contractions do not result in opening (dilation) and thinning of the cervix. Toward the end of pregnancy (32-34 weeks), Braxton Hicks contractions can happen more often and may become stronger. These contractions are sometimes difficult to tell apart from true labor because they can be very uncomfortable. You should not feel embarrassed if you go to the hospital with false labor. Sometimes, the only way to tell if you are in true labor is for your health care provider to look for changes in the cervix. The health care provider will do a physical exam and may monitor your contractions. If you are not in true labor, the exam should show that your cervix is not dilating and your water has not broken. If there are other health problems associated with your pregnancy, it is completely safe for you to be sent home with false labor. You may continue to have Braxton Hicks contractions until you go into true labor. How to tell the difference between true labor and false labor True labor  Contractions last 30-70 seconds.  Contractions become very regular.  Discomfort is usually felt in the top of the uterus, and it spreads to the lower   abdomen and low back.  Contractions do not go away with walking.  Contractions usually become more intense and increase in frequency.  The cervix dilates and gets thinner. False  labor  Contractions are usually shorter and not as strong as true labor contractions.  Contractions are usually irregular.  Contractions are often felt in the front of the lower abdomen and in the groin.  Contractions may go away when you walk around or change positions while lying down.  Contractions get weaker and are shorter-lasting as time goes on.  The cervix usually does not dilate or become thin. Follow these instructions at home:  Take over-the-counter and prescription medicines only as told by your health care provider.  Keep up with your usual exercises and follow other instructions from your health care provider.  Eat and drink lightly if you think you are going into labor.  If Braxton Hicks contractions are making you uncomfortable: ? Change your position from lying down or resting to walking, or change from walking to resting. ? Sit and rest in a tub of warm water. ? Drink enough fluid to keep your urine pale yellow. Dehydration may cause these contractions. ? Do slow and deep breathing several times an hour.  Keep all follow-up prenatal visits as told by your health care provider. This is important. Contact a health care provider if:  You have a fever.  You have continuous pain in your abdomen. Get help right away if:  Your contractions become stronger, more regular, and closer together.  You have fluid leaking or gushing from your vagina.  You pass blood-tinged mucus (bloody show).  You have bleeding from your vagina.  You have low back pain that you never had before.  You feel your baby's head pushing down and causing pelvic pressure.  Your baby is not moving inside you as much as it used to. Summary  Contractions that occur before labor are called Braxton Hicks contractions, false labor, or practice contractions.  Braxton Hicks contractions are usually shorter, weaker, farther apart, and less regular than true labor contractions. True labor  contractions usually become progressively stronger and regular and they become more frequent.  Man[facility] discomfort [facility] contractions by changing position, resting in a warm bath, drinking plenty of water, or practicing deep breathing. This information is not intended to replace advice given to you by your health care provider. Make sure you discuss any questions you have with your health care provider. Document Released: 12/13/2016 Document Revised: 12/13/2016 Document Reviewed: 12/13/2016 Elsevier Interactive Patient Education  2018 [employer]

## 2017-12-25 NOTE — Progress Notes (Signed)
Pt states is having a lot of pressure & soreness, also contractions every 20 mins but went away.

## 2017-12-25 NOTE — Progress Notes (Signed)
   PRENATAL VISIT NOTE  Subjective:  Jasmine Hudson is a 27 y.o. [phone] at [redacted]w[redacted]d being seen today for ongoing prenatal care.  She is currently monitored for the following issues for this high-risk pregnancy and has Herpes; Previous cesarean section; Pyelonephritis affecting pregnancy in first trimester; Supervision of normal intrauterine pregnancy in multigravida in first trimester; Recurrent UTI; and Rubella non-immune status, antepartum on their problem list.  Patient reports no complaints.  Contractions: Irregular. Vag. Bleeding: None.  Movement: Present. Denies leaking of fluid.   The following portions of the patient's history were reviewed and updated as appropriate: allergies, current medications, past family history, past medical history, past social history, past surgical history and problem list. Problem list updated.  Objective:   Vitals:   12/25/17 1026  BP: (!) 128/52  Pulse: 82  Weight: 285 lb 9.6 oz (129.5 kg)    Fetal Status: Fetal Heart Rate (bpm): 152 Fundal Height: 33 cm Movement: Present     General:  Alert, oriented and cooperative. Patient is in no acute distress.  Skin: Skin is warm and dry. No rash noted.   Cardiovascular: Normal heart rate noted  Respiratory: Normal respiratory effort, no problems with respiration noted  Abdomen: Soft, gravid, appropriate for gestational age.  Pain/Pressure: Present     Pelvic: Cervical exam deferred        Extremities: Normal range of motion.  Edema: None  Mental Status: Normal mood and affect. Normal behavior. Normal judgment and thought content.   Assessment and Plan:  Pregnancy: [id number] at [redacted]w[redacted]d  1. Supervision of normal first pregnancy, antepartum - No complaints. Routine care  2. Recurrent UTI - Taking medication. Denies any symptoms  3. History of 3 cesarean sections  4. Unwanted fertility - Extensive discussion with patient regarding contraceptive options and permanent sterilization. Risks and benefits  reviewed. Patient desires BTL. Consent signed.    Preterm labor symptoms and general obstetric precautions including but not limited to vaginal bleeding, contractions, leaking of fluid and fetal movement were reviewed in detail with the patient. Please refer to After Visit Summary for other counseling recommendations.  Return in about 2 weeks (around 01/08/2018) for Return OB visit.  Future Appointments  Date Time Provider Department Center  01/09/2018  3:15 PM Judeth Horn, NP [facility]  01/13/2018  9:15 AM Armando Reichert, CNM [facility]  01/13/2018 11:15 AM [facility] [place] [facility]  01/20/2018 11:15 AM Armando Reichert, CNM [facility]  01/27/2018  9:15 AM Armando Reichert, CNM [facility]  02/03/2018  9:55 AM Armando Reichert, CNM [facility]   Rolm Bookbinder, [place] 12/25/17 10:55 AM

## 2017-12-26 ENCOUNTER — Encounter: Payer: Self-pay | Admitting: *Deleted

## 2017-12-26 ENCOUNTER — Encounter (HOSPITAL_COMMUNITY): Payer: Self-pay

## 2017-12-26 ENCOUNTER — Inpatient Hospital Stay (HOSPITAL_COMMUNITY)
Admission: AD | Admit: 2017-12-26 | Discharge: 2017-12-26 | Disposition: A | Discharge status: Home / Self Care | Payer: Medicaid Other | Source: Ambulatory Visit | Attending: Obstetrics and Gynecology | Admitting: Obstetrics and Gynecology

## 2017-12-26 ENCOUNTER — Encounter: Payer: Self-pay | Admitting: Student

## 2017-12-26 DIAGNOSIS — O26833 Pregnancy related renal disease, third trimester: Secondary | ICD-10-CM | POA: Insufficient documentation

## 2017-12-26 DIAGNOSIS — R109 Unspecified abdominal pain: Secondary | ICD-10-CM | POA: Diagnosis not present

## 2017-12-26 DIAGNOSIS — N949 Unspecified condition associated with female genital organs and menstrual cycle: Secondary | ICD-10-CM

## 2017-12-26 DIAGNOSIS — O4703 False labor before 37 completed weeks of gestation, third trimester: Secondary | ICD-10-CM | POA: Insufficient documentation

## 2017-12-26 DIAGNOSIS — N189 Chronic kidney disease, unspecified: Secondary | ICD-10-CM | POA: Diagnosis not present

## 2017-12-26 DIAGNOSIS — Z87891 Personal history of nicotine dependence: Secondary | ICD-10-CM | POA: Insufficient documentation

## 2017-12-26 DIAGNOSIS — R102 Pelvic and perineal pain: Secondary | ICD-10-CM | POA: Insufficient documentation

## 2017-12-26 DIAGNOSIS — O26893 Other specified pregnancy related conditions, third trimester: Secondary | ICD-10-CM | POA: Diagnosis not present

## 2017-12-26 DIAGNOSIS — O479 False labor, unspecified: Secondary | ICD-10-CM

## 2017-12-26 DIAGNOSIS — Z3A33 33 weeks gestation of pregnancy: Secondary | ICD-10-CM | POA: Insufficient documentation

## 2017-12-26 LAB — URINALYSIS, ROUTINE W REFLEX MICROSCOPIC
Bilirubin Urine: NEGATIVE
GLUCOSE, UA: NEGATIVE mg/dL
Hgb urine dipstick: NEGATIVE
KETONES UR: NEGATIVE mg/dL
Nitrite: NEGATIVE
PROTEIN: NEGATIVE mg/dL
Specific Gravity, Urine: 1.023 (ref 1.005–1.030)
pH: 7 (ref 5.0–8.0)

## 2017-12-26 MED ORDER — NIFEDIPINE 10 MG PO CAPS
20.0000 mg | ORAL_CAPSULE | Freq: Once | ORAL | Status: AC
  Administered 2017-12-26: 20 mg via ORAL
  Filled 2017-12-26: qty 2

## 2017-12-26 MED ORDER — NIFEDIPINE 10 MG PO CAPS: 10.0000 mg | ORAL_CAPSULE | Freq: Three times a day (TID) | ORAL | 1 refills | Status: DC

## 2017-12-26 NOTE — MAU Provider Note (Signed)
History     CSN: [id number]  Arrival date and time: 12/26/17 1245   First Provider Initiated Contact with Patient 12/26/17 1343      Chief Complaint  Patient presents with  . Contractions   HPI   [name] is a 27 y.o. female [phone] @ [redacted]w[redacted]d here in MAU with contractions that started yesterday. Last intercourse was 3-4 days ago. No history of preterm contractions. Says the contractions were so strong last night that they were making her cry. The contraction pain comes and goes. Currently the pain is very mild. Has not taken anything over the counter for the pain. + fetal movement. She currently rates her pain 5/10.   OB History    Gravida  5   Para  3   Term  3   Preterm  0   AB  1   Living  3     SAB  0   TAB  1   Ectopic      Multiple      Live Births  3           Past Medical History:  Diagnosis Date  . Chronic kidney disease   . Herpes   . Pyelonephritis affecting pregnancy 2013  . Trichomoniasis 09/18/2013   09/18/13 Phone number disconnected > requested letter to be sent or inform at next visit     Past Surgical History:  Procedure Laterality Date  . CESAREAN SECTION  2007  . CESAREAN SECTION  02/29/2012   Procedure: CESAREAN SECTION;  Surgeon: Tilda Burrow, MD;  Location: [facility];  Service: Gynecology;  Laterality: N/A;  Repeat   . CESAREAN SECTION N/A 10/23/2013   Procedure: CESAREAN SECTION;  Surgeon: Reva Bores, MD;  Location: [facility];  Service: Obstetrics;  Laterality: N/A;  Small abrasion located on the left lower lateral of the abdomen.  Dermabond applied to the abrasion by Dr. Reola Calkins.  See OR note.  . INDUCED ABORTION      Family History  Problem Relation Age of Onset  . Hypertension Mother   . Hypertension Maternal Grandfather     Social History   Tobacco Use  . Smoking status: Former Smoker    Types: Cigars    Last attempt to quit: 08/13/2010    Years since quitting: 7.3  . Smokeless tobacco: Never Used  Substance Use  Topics  . Alcohol use: No    Frequency: Never    Comment: ocassionally  . Drug use: No    Allergies:  Allergies  Allergen Reactions  . Apple Itching and Swelling    Red Apples  . Banana Itching and Swelling    Medications Prior to Admission  Medication Sig Dispense Refill Last Dose  . Multiple Vitamin (MULTIVITAMIN) tablet Take 1 tablet by mouth daily.   Taking  . nitrofurantoin (MACRODANTIN) 100 MG capsule Take 1 capsule (100 mg total) by mouth at bedtime. (Patient not taking: Reported on 11/20/2017) 30 capsule 3 Not Taking  . ondansetron (ZOFRAN) 8 MG tablet Take 1 tablet (8 mg total) by mouth every 8 (eight) hours as needed for nausea or vomiting. (Patient not taking: Reported on 11/04/2017) 20 tablet 0 Not Taking   Results for orders placed or performed during the hospital encounter of 12/26/17 (from the past 48 hour(s))  Urinalysis, Routine w reflex microscopic     Status: Abnormal   Collection Time: 12/26/17  1:16 PM  Result Value Ref Range   Color, Urine YELLOW YELLOW  APPearance HAZY (A) CLEAR   Specific Gravity, Urine 1.023 1.005 - 1.030   pH 7.0 5.0 - 8.0   Glucose, UA NEGATIVE NEGATIVE mg/dL   Hgb urine dipstick NEGATIVE NEGATIVE   Bilirubin Urine NEGATIVE NEGATIVE   Ketones, ur NEGATIVE NEGATIVE mg/dL   Protein, ur NEGATIVE NEGATIVE mg/dL   Nitrite NEGATIVE NEGATIVE   Leukocytes, UA TRACE (A) NEGATIVE   RBC / HPF 0-5 0 - 5 RBC/hpf   WBC, UA 0-5 0 - 5 WBC/hpf   Bacteria, UA RARE (A) NONE SEEN   Squamous Epithelial / LPF 11-20 0 - 5   Mucus PRESENT     Comment: Performed at [facility], [address], [place], [place] [address]    Review of Systems  Constitutional: Negative for fever.  Gastrointestinal: Positive for abdominal pain. Negative for nausea.  Genitourinary: Negative for dysuria and vaginal bleeding.   Physical Exam   Blood pressure 109/83, pulse 98, temperature 98.6 F (37 C), resp. rate 18, height  (1.702 m), weight 287 lb (130.2  kg), last menstrual period 05/09/2017, unknown if currently breastfeeding.  Physical Exam  Constitutional: She is oriented to person, place, and time. She appears well-developed and well-nourished. No distress.  HENT:  Head: Normocephalic.  Eyes: Pupils are equal, round, and reactive to light.  Genitourinary:  Genitourinary Comments: Dilation: Closed Effacement (%): Thick Cervical Position: Posterior Exam by:: J Chari Parmenter NP  Musculoskeletal: Normal range of motion.  Neurological: She is alert and oriented to person, place, and time.  Skin: Skin is warm. She is not diaphoretic.  Psychiatric: Her behavior is normal.   Fetal Tracing: Baseline: 145 bpm Variability: Moderate  Accelerations: 15x15 Decelerations: None Toco: UI   MAU Course  Procedures  None  MDM  UA Procardia given 20 mg PO, patient says she is feeling much better Oral fluids pushed in MAU.   Assessment and Plan   A:  1. Abdominal pain in pregnancy, third trimester   2. Round ligament pain   3. Braxton Hick's contraction   4. [redacted] weeks gestation of pregnancy     P:  Discharge home with strict return precautions Rx: Procardia Preterm labor precautions Increase oral fluid intake Pregnancy support belt  Jadyn Barge, Harolyn Rutherford, NP 12/26/2017 5:31 PM

## 2017-12-26 NOTE — MAU Note (Signed)
Pt reports she has been having ctx  q15-20 min on and off since yesterday and reports increased pelvic pressure. Denies any vag discharge or bleeding. Good fetal movement reported.

## 2017-12-26 NOTE — Discharge Instructions (Signed)

## 2018-01-09 ENCOUNTER — Encounter: Payer: Self-pay | Admitting: Student

## 2018-01-13 ENCOUNTER — Encounter: Payer: Self-pay | Admitting: Advanced Practice Midwife

## 2018-01-13 ENCOUNTER — Ambulatory Visit (HOSPITAL_COMMUNITY)
Admission: RE | Admit: 2018-01-13 | Discharge: 2018-01-13 | Disposition: A | Discharge status: Home / Self Care | Payer: Medicaid Other | Source: Ambulatory Visit | Attending: Advanced Practice Midwife | Admitting: Advanced Practice Midwife

## 2018-01-13 ENCOUNTER — Encounter (HOSPITAL_COMMUNITY): Payer: Self-pay

## 2018-01-13 DIAGNOSIS — Z362 Encounter for other antenatal screening follow-up: Secondary | ICD-10-CM | POA: Diagnosis not present

## 2018-01-13 DIAGNOSIS — Z3A36 36 weeks gestation of pregnancy: Secondary | ICD-10-CM | POA: Diagnosis not present

## 2018-01-16 ENCOUNTER — Other Ambulatory Visit (HOSPITAL_COMMUNITY)
Admission: RE | Admit: 2018-01-16 | Discharge: 2018-01-16 | Disposition: A | Discharge status: Home / Self Care | Payer: Medicaid Other | Source: Ambulatory Visit | Attending: Family Medicine | Admitting: Family Medicine

## 2018-01-16 ENCOUNTER — Ambulatory Visit (INDEPENDENT_AMBULATORY_CARE_PROVIDER_SITE_OTHER): Payer: Medicaid Other | Admitting: Family Medicine

## 2018-01-16 ENCOUNTER — Encounter (HOSPITAL_COMMUNITY): Payer: Self-pay

## 2018-01-16 VITALS — BP 111/76 | HR 100 | Wt 285.0 lb

## 2018-01-16 DIAGNOSIS — Z3481 Encounter for supervision of other normal pregnancy, first trimester: Secondary | ICD-10-CM | POA: Diagnosis present

## 2018-01-16 DIAGNOSIS — Z98891 History of uterine scar from previous surgery: Secondary | ICD-10-CM

## 2018-01-16 DIAGNOSIS — Z283 Underimmunization status: Secondary | ICD-10-CM

## 2018-01-16 DIAGNOSIS — Z2839 Other underimmunization status: Secondary | ICD-10-CM

## 2018-01-16 DIAGNOSIS — O2301 Infections of kidney in pregnancy, first trimester: Secondary | ICD-10-CM

## 2018-01-16 DIAGNOSIS — O9989 Other specified diseases and conditions complicating pregnancy, childbirth and the puerperium: Secondary | ICD-10-CM

## 2018-01-16 NOTE — Patient Instructions (Signed)

## 2018-01-16 NOTE — Progress Notes (Signed)
   PRENATAL VISIT NOTE  Subjective:  Jasmine Hudson is a 27 y.o. [phone] at 12w0dbeing seen today for ongoing prenatal care.  She is currently monitored for the following issues for this low-risk pregnancy and has Herpes; Previous cesarean section; Pyelonephritis affecting pregnancy in first trimester; Supervision of normal intrauterine pregnancy in multigravida in first trimester; Recurrent UTI; and Rubella non-immune status, antepartum on their problem list.  Patient reports no complaints.  Contractions: Irregular. Vag. Bleeding: None.  Movement: Present. Denies leaking of fluid.   The following portions of the patient's history were reviewed and updated as appropriate: allergies, current medications, past family history, past medical history, past social history, past surgical history and problem list. Problem list updated.  Objective:   Vitals:   01/16/18 1047  BP: 111/76  Pulse: 100  Weight: 285 lb (129.3 kg)    Fetal Status: Fetal Heart Rate (bpm): 145 Fundal Height: 36 cm Movement: Present  Presentation: Vertex  General:  Alert, oriented and cooperative. Patient is in no acute distress.  Skin: Skin is warm and dry. No rash noted.   Cardiovascular: Normal heart rate noted  Respiratory: Normal respiratory effort, no problems with respiration noted  Abdomen: Soft, gravid, appropriate for gestational age.  Pain/Pressure: Present     Pelvic: Cervical exam performed Dilation: Fingertip Effacement (%): Thick Station: Ballotable  Extremities: Normal range of motion.  Edema: None  Mental Status: Normal mood and affect. Normal behavior. Normal judgment and thought content.   Assessment and Plan:  Pregnancy: [id number] 33w0d1. Pyelonephritis affecting pregnancy in first trimester On daily suppression  2. Supervision of normal intrauterine pregnancy in multigravida in first trimester Cultures today - Culture, beta strep (group b only) - GC[facility]Chlam[facility]. Rubella non-immune status, antepartum MMR pp  4. Previous cesarean section For RCS with BTL desires 6/28 date--will try to schedule with me.  Preterm labor symptoms and general obstetric precautions including but not limited to vaginal bleeding, contractions, leaking of fluid and fetal movement were reviewed in detail with the patient. Please refer to After Visit Summary for other counseling recommendations.  Return in 1 week (on 01/23/2018).  Future Appointments  Date Time Provider [place]6/05/2018 11:15 AM [facility] [facility] [place]/17/2019  9:15 AM [facility] [place]/24/2019  9:55 AM [facility]    TaDonnamae JudeMD

## 2018-01-17 LAB — GC/CHLAMYDIA PROBE AMP (~~LOC~~) NOT AT ARMC
Chlamydia: NEGATIVE
NEISSERIA GONORRHEA: NEGATIVE

## 2018-01-19 ENCOUNTER — Encounter: Payer: Self-pay | Admitting: Family Medicine

## 2018-01-19 DIAGNOSIS — O99820 Streptococcus B carrier state complicating pregnancy: Secondary | ICD-10-CM | POA: Insufficient documentation

## 2018-01-19 LAB — CULTURE, BETA STREP (GROUP B ONLY): STREP GP B CULTURE: POSITIVE — AB

## 2018-01-20 ENCOUNTER — Ambulatory Visit (INDEPENDENT_AMBULATORY_CARE_PROVIDER_SITE_OTHER): Payer: Medicaid Other | Admitting: Advanced Practice Midwife

## 2018-01-20 ENCOUNTER — Encounter (HOSPITAL_COMMUNITY): Payer: Self-pay

## 2018-01-20 VITALS — BP 126/73 | HR 87 | Wt 284.4 lb

## 2018-01-20 DIAGNOSIS — Z3483 Encounter for supervision of other normal pregnancy, third trimester: Secondary | ICD-10-CM

## 2018-01-20 DIAGNOSIS — O09899 Supervision of other high risk pregnancies, unspecified trimester: Secondary | ICD-10-CM

## 2018-01-20 DIAGNOSIS — B009 Herpesviral infection, unspecified: Secondary | ICD-10-CM

## 2018-01-20 DIAGNOSIS — O98513 Other viral diseases complicating pregnancy, third trimester: Secondary | ICD-10-CM

## 2018-01-20 DIAGNOSIS — O9989 Other specified diseases and conditions complicating pregnancy, childbirth and the puerperium: Secondary | ICD-10-CM

## 2018-01-20 DIAGNOSIS — Z283 Underimmunization status: Secondary | ICD-10-CM

## 2018-01-20 NOTE — Progress Notes (Signed)
   PRENATAL VISIT NOTE  Subjective:  Jasmine Hudson is a 27 y.o. [phone] at 6830w4d being seen today for ongoing prenatal care.  She is currently monitored for the following issues for this low-risk pregnancy and has Herpes; Previous cesarean section; Pyelonephritis affecting pregnancy in first trimester; Supervision of normal intrauterine pregnancy in multigravida in first trimester; Recurrent UTI; Rubella non-immune status, antepartum; and Group B Streptococcus carrier, +RV culture, currently pregnant on their problem list.  Patient reports no bleeding, no cramping, no leaking and occasional contractions "all the time" that resolve with rest.  Contractions: Irregular. Vag. Bleeding: None.  Movement: Present. Denies leaking of fluid.   The following portions of the patient's history were reviewed and updated as appropriate: allergies, current medications, past family history, past medical history, past social history, past surgical history and problem list. Problem list updated.  Objective:   Vitals:   01/20/18 1139  BP: 126/73  Pulse: 87  Weight: 284 lb 6.4 oz (129 kg)    Fetal Status: Fetal Heart Rate (bpm): 152 Fundal Height: 37 cm Movement: Present  Presentation: Vertex  General:  Alert, oriented and cooperative. Patient is in no acute distress.  Skin: Skin is warm and dry. No rash noted.   Cardiovascular: Normal heart rate noted  Respiratory: Normal respiratory effort, no problems with respiration noted  Abdomen: Soft, gravid, appropriate for gestational age.  Pain/Pressure: Present     Pelvic: Cervical exam performed      0.5/thick/posterior  Extremities: Normal range of motion.  Edema: None  Mental Status: Normal mood and affect. Normal behavior. Normal judgment and thought content.   Assessment and Plan:  Pregnancy: [id number] at 530w4d  1. Encounter for supervision of other normal pregnancy in third trimester - Doing well today - Reviewed Braxton Hicks vs authentic labor  contractions - RLTCS scheduled for 0730 on 02/08/18  2. Herpes infection in pregnancy, third trimester - Continues to decline suppression - No outbreaks or lesions noted  3. Rubella non-immune status, antepartum -- Postpartum immunization  Preterm labor symptoms and general obstetric precautions including but not limited to vaginal bleeding, contractions, leaking of fluid and fetal movement were reviewed in detail with the patient. Please refer to After Visit Summary for other counseling recommendations.  No follow-ups on file.  Future Appointments  Date Time Provider Department Center  01/27/2018  9:15 AM Armando ReichertHogan, Heather D, CNM [facility]  02/03/2018  9:55 AM Armando ReichertHogan, Heather D, CNM [facility]    Calvert CantorSamantha C [place], [place]  01/20/18  11:58 AM

## 2018-01-20 NOTE — Patient Instructions (Signed)
Braxton Hicks Contractions °Contractions of the uterus can occur throughout pregnancy, but they are not always a sign that you are in labor. You may have practice contractions called Braxton Hicks contractions. These false labor contractions are sometimes confused with true labor. °What are Braxton Hicks contractions? °Braxton Hicks contractions are tightening movements that occur in the muscles of the uterus before labor. Unlike true labor contractions, these contractions do not result in opening (dilation) and thinning of the cervix. Toward the end of pregnancy (32-34 weeks), Braxton Hicks contractions can happen more often and may become stronger. These contractions are sometimes difficult to tell apart from true labor because they can be very uncomfortable. You should not feel embarrassed if you go to the hospital with false labor. °Sometimes, the only way to tell if you are in true labor is for your health care provider to look for changes in the cervix. The health care provider will do a physical exam and may monitor your contractions. If you are not in true labor, the exam should show that your cervix is not dilating and your water has not broken. °If there are other health problems associated with your pregnancy, it is completely safe for you to be sent home with false labor. You may continue to have Braxton Hicks contractions until you go into true labor. °How to tell the difference between true labor and false labor °True labor °· Contractions last 30-70 seconds. °· Contractions become very regular. °· Discomfort is usually felt in the top of the uterus, and it spreads to the lower abdomen and low back. °· Contractions do not go away with walking. °· Contractions usually become more intense and increase in frequency. °· The cervix dilates and gets thinner. °False labor °· Contractions are usually shorter and not as strong as true labor contractions. °· Contractions are usually irregular. °· Contractions  are often felt in the front of the lower abdomen and in the groin. °· Contractions may go away when you walk around or change positions while lying down. °· Contractions get weaker and are shorter-lasting as time goes on. °· The cervix usually does not dilate or become thin. °Follow these instructions at home: °· Take over-the-counter and prescription medicines only as told by your health care provider. °· Keep up with your usual exercises and follow other instructions from your health care provider. °· Eat and drink lightly if you think you are going into labor. °· If Braxton Hicks contractions are making you uncomfortable: °? Change your position from lying down or resting to walking, or change from walking to resting. °? Sit and rest in a tub of warm water. °? Drink enough fluid to keep your urine pale yellow. Dehydration may cause these contractions. °? Do slow and deep breathing several times an hour. °· Keep all follow-up prenatal visits as told by your health care provider. This is important. °Contact a health care provider if: °· You have a fever. °· You have continuous pain in your abdomen. °Get help right away if: °· Your contractions become stronger, more regular, and closer together. °· You have fluid leaking or gushing from your vagina. °· You pass blood-tinged mucus (bloody show). °· You have bleeding from your vagina. °· You have low back pain that you never had before. °· You feel your baby’s head pushing down and causing pelvic pressure. °· Your baby is not moving inside you as much as it used to. °Summary °· Contractions that occur before labor are called Braxton   Hicks contractions, false labor, or practice contractions. °· Braxton Hicks contractions are usually shorter, weaker, farther apart, and less regular than true labor contractions. True labor contractions usually become progressively stronger and regular and they become more frequent. °· Manage discomfort from Braxton Hicks contractions by  changing position, resting in a warm bath, drinking plenty of water, or practicing deep breathing. °This information is not intended to replace advice given to you by your health care provider. Make sure you discuss any questions you have with your health care provider. °Document Released: 12/13/2016 Document Revised: 12/13/2016 Document Reviewed: 12/13/2016 °Elsevier Interactive Patient Education © 2018 [employer] Inc[employer] ° °

## 2018-01-24 ENCOUNTER — Encounter (HOSPITAL_COMMUNITY): Payer: Self-pay

## 2018-01-27 ENCOUNTER — Ambulatory Visit (INDEPENDENT_AMBULATORY_CARE_PROVIDER_SITE_OTHER): Payer: Medicaid Other | Admitting: Advanced Practice Midwife

## 2018-01-27 ENCOUNTER — Encounter: Payer: Self-pay | Admitting: Advanced Practice Midwife

## 2018-01-27 VITALS — BP 126/65 | HR 78 | Wt 286.0 lb

## 2018-01-27 DIAGNOSIS — O2301 Infections of kidney in pregnancy, first trimester: Secondary | ICD-10-CM

## 2018-01-27 DIAGNOSIS — Z3481 Encounter for supervision of other normal pregnancy, first trimester: Secondary | ICD-10-CM

## 2018-01-27 DIAGNOSIS — O09899 Supervision of other high risk pregnancies, unspecified trimester: Secondary | ICD-10-CM

## 2018-01-27 DIAGNOSIS — Z98891 History of uterine scar from previous surgery: Secondary | ICD-10-CM

## 2018-01-27 DIAGNOSIS — O9989 Other specified diseases and conditions complicating pregnancy, childbirth and the puerperium: Secondary | ICD-10-CM

## 2018-01-27 DIAGNOSIS — O99820 Streptococcus B carrier state complicating pregnancy: Secondary | ICD-10-CM

## 2018-01-27 DIAGNOSIS — Z283 Underimmunization status: Secondary | ICD-10-CM

## 2018-01-27 NOTE — Progress Notes (Signed)
   PRENATAL VISIT NOTE  Subjective:  Jasmine Hudson is a 27 y.o. [phone] at 6158w4d being seen today for ongoing prenatal care.  She is currently monitored for the following issues for this low-risk pregnancy and has Herpes; Previous cesarean section; Pyelonephritis affecting pregnancy in first trimester; Supervision of normal intrauterine pregnancy in multigravida in first trimester; Recurrent UTI; Rubella non-immune status, antepartum; and Group B Streptococcus carrier, +RV culture, currently pregnant on their problem list.  Patient reports no complaints.  Contractions: Irregular. Vag. Bleeding: None.  Movement: Present. Denies leaking of fluid.   The following portions of the patient's history were reviewed and updated as appropriate: allergies, current medications, past family history, past medical history, past social history, past surgical history and problem list. Problem list updated.  Objective:   Vitals:   01/27/18 1019  BP: 126/65  Pulse: 78  Weight: 286 lb (129.7 kg)    Fetal Status: Fetal Heart Rate (bpm): 152 Fundal Height: 38 cm Movement: Present     General:  Alert, oriented and cooperative. Patient is in no acute distress.  Skin: Skin is warm and dry. No rash noted.   Cardiovascular: Normal heart rate noted  Respiratory: Normal respiratory effort, no problems with respiration noted  Abdomen: Soft, gravid, appropriate for gestational age.  Pain/Pressure: Present     Pelvic: Cervical exam performed Dilation: Fingertip Effacement (%): Thick Station: Ballotable  Extremities: Normal range of motion.  Edema: Trace  Mental Status: Normal mood and affect. Normal behavior. Normal judgment and thought content.   Assessment and Plan:  Pregnancy: [id number] at 1358w4d  1. Pyelonephritis affecting pregnancy in first trimester - On suppression   2. Supervision of normal intrauterine pregnancy in multigravida in first trimester - Routine care   3. Rubella non-immune status,  antepartum - Immunize PP   4. Previous cesarean section - repeat scheduled for 02/08/18   5. Group B Streptococcus carrier, +RV culture, currently pregnant   Preterm labor symptoms and general obstetric precautions including but not limited to vaginal bleeding, contractions, leaking of fluid and fetal movement were reviewed in detail with the patient. Please refer to After Visit Summary for other counseling recommendations.  Return in about 1 week (around 02/03/2018).  Future Appointments  Date Time Provider Department Center  02/03/2018  9:55 AM Armando ReichertHogan, Tahjae Clausing D, CNM [facility]  02/07/2018  9:00 AM [facility] PAT 5 [facility] None    Thressa ShellerHeather Zalayah Pizzuto, CNM

## 2018-01-27 NOTE — Patient Instructions (Signed)

## 2018-02-03 ENCOUNTER — Ambulatory Visit (INDEPENDENT_AMBULATORY_CARE_PROVIDER_SITE_OTHER): Payer: Medicaid Other | Admitting: Advanced Practice Midwife

## 2018-02-03 ENCOUNTER — Encounter: Payer: Self-pay | Admitting: Advanced Practice Midwife

## 2018-02-03 VITALS — BP 132/73 | HR 83

## 2018-02-03 DIAGNOSIS — Z3480 Encounter for supervision of other normal pregnancy, unspecified trimester: Secondary | ICD-10-CM

## 2018-02-03 DIAGNOSIS — Z3481 Encounter for supervision of other normal pregnancy, first trimester: Secondary | ICD-10-CM

## 2018-02-03 NOTE — Progress Notes (Signed)
   PRENATAL VISIT NOTE  Subjective:  Jasmine Hudson is a 27 y.o. [phone] at 7253w4d being seen today for ongoing prenatal care.  She is currently monitored for the following issues for this low-risk pregnancy and has Herpes; Previous cesarean section; Pyelonephritis affecting pregnancy in first trimester; Supervision of normal intrauterine pregnancy in multigravida in first trimester; Recurrent UTI; Rubella non-immune status, antepartum; and Group B Streptococcus carrier, +RV culture, currently pregnant on their problem list.  Patient reports no complaints.  Contractions: Irregular. Vag. Bleeding: None.  Movement: Present. Denies leaking of fluid.   The following portions of the patient's history were reviewed and updated as appropriate: allergies, current medications, past family history, past medical history, past social history, past surgical history and problem list. Problem list updated.  Objective:   Vitals:   02/03/18 1049  BP: 132/73  Pulse: 83    Fetal Status: Fetal Heart Rate (bpm): 150 Fundal Height: 39 cm Movement: Present     General:  Alert, oriented and cooperative. Patient is in no acute distress.  Skin: Skin is warm and dry. No rash noted.   Cardiovascular: Normal heart rate noted  Respiratory: Normal respiratory effort, no problems with respiration noted  Abdomen: Soft, gravid, appropriate for gestational age.  Pain/Pressure: Present     Pelvic: Cervical exam deferred        Extremities: Normal range of motion.  Edema: Trace  Mental Status: Normal mood and affect. Normal behavior. Normal judgment and thought content.   Assessment and Plan:  Pregnancy: [id number] at 9153w4d  1. Supervision of other normal pregnancy, antepartum - Routine care - c-section scheduled for 02/08/18   Term labor symptoms and general obstetric precautions including but not limited to vaginal bleeding, contractions, leaking of fluid and fetal movement were reviewed in detail with the  patient. Please refer to After Visit Summary for other counseling recommendations.  Return in about 3 weeks (around 02/21/2018) for incision check .  Future Appointments  Date Time Provider Department Center  02/07/2018  9:00 AM [facility] PAT 5 [facility] None    Thressa ShellerHeather Averlee Swartz, CNM

## 2018-02-03 NOTE — Patient Instructions (Signed)

## 2018-02-05 NOTE — Patient Instructions (Signed)
Raelyn EnsignKaalea T Moure  02/05/2018   Your procedure is scheduled on:  02/08/2018  [facility].  Pick up the phone at the desk and dial [phone]  Call this number if you have problems the morning of surgery:[phone]  Remember:   Do not eat food:(After Midnight) Desps de medianoche.  Do not drink clear liquids: (After Midnight) Desps de medianoche.  Take these medicines the morning of surgery with A SIP OF WATER: none   Do not wear jewelry, make-up or nail polish.  Do not wear lotions, powders, or perfumes. Do not wear deodorant.  Do not shave 48 hours prior to surgery.  Do not bring valuables to the hospital.  [facility] is not   responsible for any belongings or valuables brought to the hospital.  Contacts, dentures or bridgework may not be worn into surgery.  Leave suitcase in the car. After surgery it may be brought to your room.  For patients admitted to the hospital, checkout time is 11:00 AM the day of              discharge.    N/A   Please read over the following fact sheets that you were given:   Surgical Site Infection Prevention

## 2018-02-06 NOTE — H&P (Signed)
Jasmine Hudson is an 27 y.o. [phone] 64w0dfemale.   Chief Complaint: Previous C-section and undesired fertility HPI: Previous C-section x 3 who needs repeat at 372 wksand desires BTL  Past Medical History:  Diagnosis Date  . Chronic kidney disease   . Herpes   . Pyelonephritis affecting pregnancy 2013  . Trichomoniasis 09/18/2013   09/18/13 Phone number disconnected > requested letter to be sent or inform at next visit     Past Surgical History:  Procedure Laterality Date  . CESAREAN SECTION  2007  . CESAREAN SECTION  02/29/2012   Procedure: CESAREAN SECTION;  Surgeon: JJonnie Kind MD;  [facility];  Service: Gynecology;  Laterality: N/A;  Repeat   . CESAREAN SECTION N/A 10/23/2013   Procedure: CESAREAN SECTION;  Surgeon: TDonnamae Jude MD;  [facility];  Service: Obstetrics;  Laterality: N/A;  Small abrasion located on the left lower lateral of the abdomen.  Dermabond applied to the abrasion by Dr. BOlevia Bowens  See OR note.  . INDUCED ABORTION      Family History  Problem Relation Age of Onset  . Hypertension Mother   . Hypertension Maternal Grandfather    Social History:  reports that she quit smoking about 7 years ago. Her smoking use included cigars. She has never used smokeless tobacco. She reports that she does not drink alcohol or use drugs.    Allergies  Allergen Reactions  . Apple Itching and Swelling    Red Apples  . Banana Itching and Swelling    No medications prior to admission.     A comprehensive review of systems was negative.  Last menstrual period 05/09/2017, unknown if currently breastfeeding. LMP 05/09/2017  General appearance: alert, cooperative and appears stated age Head: Normocephalic, without obvious abnormality, atraumatic Neck: supple, symmetrical, trachea midline Lungs: normal effort Heart: regular rate and rhythm Abdomen: gravid, NT Extremities: Homans sign is negative, no sign of DVT Skin: Skin color, texture, turgor normal. No  rashes or lesions   Lab Results  Component Value Date   WBC 8.8 12/02/2017   HGB 11.6 12/02/2017   HCT 35.0 12/02/2017   MCV 86 12/02/2017   PLT 310 12/02/2017         ABO, Rh: A/Positive/-- (03/25 1630)  Antibody: Negative (03/25 1630)  Rubella: <0.90 (03/25 1630)  RPR: Non Reactive (04/22 0931)  HBsAg: Negative (03/25 1630)  HIV: Non Reactive (04/22 0931)  GBS:       Assessment/Plan Principal Problem:   Previous cesarean section Active Problems:   Rubella non-immune status, antepartum  For RCS and BTL Needs MMR pp  Risks include but are not limited to bleeding, infection, injury to surrounding structures, including bowel, bladder and ureters, blood clots, and death.  Likelihood of success is high.   TDonnamae Jude6/27/2019, 6:57 PM

## 2018-02-07 ENCOUNTER — Encounter (HOSPITAL_COMMUNITY)
Admission: RE | Admit: 2018-02-07 | Discharge: 2018-02-07 | Disposition: A | Discharge status: Home / Self Care | Payer: Medicaid Other | Source: Ambulatory Visit | Attending: Family Medicine | Admitting: Family Medicine

## 2018-02-07 LAB — TYPE AND SCREEN
ABO/RH(D): A POS
Antibody Screen: NEGATIVE

## 2018-02-07 LAB — CBC
HEMATOCRIT: 37.1 % (ref 36.0–46.0)
Hemoglobin: 12 g/dL (ref 12.0–15.0)
MCH: 27 pg (ref 26.0–34.0)
MCHC: 32.3 g/dL (ref 30.0–36.0)
MCV: 83.4 fL (ref 78.0–100.0)
Platelets: 278 10*3/uL (ref 150–400)
RBC: 4.45 MIL/uL (ref 3.87–5.11)
RDW: 15.2 % (ref 11.5–15.5)
WBC: 8.5 10*3/uL (ref 4.0–10.5)

## 2018-02-08 ENCOUNTER — Encounter (HOSPITAL_COMMUNITY): Payer: Self-pay | Admitting: Anesthesiology

## 2018-02-08 ENCOUNTER — Inpatient Hospital Stay (HOSPITAL_COMMUNITY)
Admission: AD | Admit: 2018-02-08 | Discharge: 2018-02-11 | DRG: 785 | Disposition: A | Discharge status: Home / Self Care | Payer: Medicaid Other | Attending: Family Medicine | Admitting: Family Medicine

## 2018-02-08 ENCOUNTER — Encounter (HOSPITAL_COMMUNITY)
Admission: AD | Disposition: A | Discharge status: Home / Self Care | Payer: Self-pay | Source: Home / Self Care | Attending: Family Medicine

## 2018-02-08 ENCOUNTER — Inpatient Hospital Stay (HOSPITAL_COMMUNITY): Payer: Medicaid Other | Admitting: Anesthesiology

## 2018-02-08 DIAGNOSIS — N390 Urinary tract infection, site not specified: Secondary | ICD-10-CM | POA: Diagnosis present

## 2018-02-08 DIAGNOSIS — Z3A39 39 weeks gestation of pregnancy: Secondary | ICD-10-CM

## 2018-02-08 DIAGNOSIS — O2301 Infections of kidney in pregnancy, first trimester: Secondary | ICD-10-CM | POA: Diagnosis present

## 2018-02-08 DIAGNOSIS — Z98891 History of uterine scar from previous surgery: Secondary | ICD-10-CM

## 2018-02-08 DIAGNOSIS — Z283 Underimmunization status: Secondary | ICD-10-CM

## 2018-02-08 DIAGNOSIS — Z87891 Personal history of nicotine dependence: Secondary | ICD-10-CM | POA: Diagnosis not present

## 2018-02-08 DIAGNOSIS — O09899 Supervision of other high risk pregnancies, unspecified trimester: Secondary | ICD-10-CM

## 2018-02-08 DIAGNOSIS — B009 Herpesviral infection, unspecified: Secondary | ICD-10-CM | POA: Diagnosis present

## 2018-02-08 DIAGNOSIS — O99214 Obesity complicating childbirth: Secondary | ICD-10-CM | POA: Diagnosis present

## 2018-02-08 DIAGNOSIS — Z302 Encounter for sterilization: Secondary | ICD-10-CM

## 2018-02-08 DIAGNOSIS — O99820 Streptococcus B carrier state complicating pregnancy: Secondary | ICD-10-CM

## 2018-02-08 DIAGNOSIS — Z2839 Other underimmunization status: Secondary | ICD-10-CM

## 2018-02-08 DIAGNOSIS — O34211 Maternal care for low transverse scar from previous cesarean delivery: Secondary | ICD-10-CM | POA: Diagnosis present

## 2018-02-08 DIAGNOSIS — O9989 Other specified diseases and conditions complicating pregnancy, childbirth and the puerperium: Secondary | ICD-10-CM

## 2018-02-08 LAB — CREATININE, SERUM
Creatinine, Ser: 0.48 mg/dL (ref 0.44–1.00)
GFR calc non Af Amer: >60 mL/min (ref >60)

## 2018-02-08 LAB — CBC
HCT: 35.6 % — ABNORMAL LOW (ref 36.0–46.0)
Hemoglobin: 11.6 g/dL — ABNORMAL LOW (ref 12.0–15.0)
MCH: 27 pg (ref 26.0–34.0)
MCHC: 32.6 g/dL (ref 30.0–36.0)
MCV: 82.8 fL (ref 78.0–100.0)
Platelets: 273 10*3/uL (ref 150–400)
RBC: 4.3 MIL/uL (ref 3.87–5.11)
RDW: 15.3 % (ref 11.5–15.5)
WBC: 8.6 10*3/uL (ref 4.0–10.5)

## 2018-02-08 LAB — RPR: RPR Ser Ql: NONREACTIVE

## 2018-02-08 SURGERY — Surgical Case
Anesthesia: Spinal | Laterality: Bilateral

## 2018-02-08 MED ORDER — BUPIVACAINE HCL (PF) 0.25 % IJ SOLN
INTRAMUSCULAR | Status: AC
  Filled 2018-02-08: qty 10

## 2018-02-08 MED ORDER — OXYTOCIN 10 UNIT/ML IJ SOLN
INTRAVENOUS | Status: DC | PRN
  Administered 2018-02-08: 40 [IU] via INTRAVENOUS

## 2018-02-08 MED ORDER — SCOPOLAMINE 1 MG/3DAYS TD PT72
MEDICATED_PATCH | TRANSDERMAL | Status: AC
  Filled 2018-02-08: qty 1

## 2018-02-08 MED ORDER — OXYCODONE HCL 5 MG/5ML PO SOLN: 5.0000 mg | Freq: Once | ORAL | Status: DC | PRN

## 2018-02-08 MED ORDER — OXYCODONE HCL 5 MG PO TABS: 5.0000 mg | ORAL_TABLET | Freq: Once | ORAL | Status: DC | PRN

## 2018-02-08 MED ORDER — BUPIVACAINE IN DEXTROSE 0.75-8.25 % IT SOLN
INTRATHECAL | Status: DC | PRN
  Administered 2018-02-08: 1.6 mL via INTRATHECAL

## 2018-02-08 MED ORDER — BUPIVACAINE HCL (PF) 0.25 % IJ SOLN
INTRAMUSCULAR | Status: DC | PRN
  Administered 2018-02-08: 30 mL

## 2018-02-08 MED ORDER — DIBUCAINE 1 % RE OINT: 1.0000 "application " | TOPICAL_OINTMENT | RECTAL | Status: DC | PRN

## 2018-02-08 MED ORDER — COCONUT OIL OIL: 1.0000 "application " | TOPICAL_OIL | Status: DC | PRN

## 2018-02-08 MED ORDER — NALBUPHINE HCL 10 MG/ML IJ SOLN: 5.0000 mg | INTRAMUSCULAR | Status: DC | PRN

## 2018-02-08 MED ORDER — SCOPOLAMINE 1 MG/3DAYS TD PT72
1.0000 | MEDICATED_PATCH | Freq: Once | TRANSDERMAL | Status: DC
  Administered 2018-02-08: 1.5 mg via TRANSDERMAL

## 2018-02-08 MED ORDER — WITCH HAZEL-GLYCERIN EX PADS: 1.0000 "application " | MEDICATED_PAD | CUTANEOUS | Status: DC | PRN

## 2018-02-08 MED ORDER — MEPERIDINE HCL 25 MG/ML IJ SOLN: 6.2500 mg | INTRAMUSCULAR | Status: DC | PRN

## 2018-02-08 MED ORDER — SOD CITRATE-CITRIC ACID 500-334 MG/5ML PO SOLN: 30.0000 mL | Freq: Once | ORAL | Status: DC

## 2018-02-08 MED ORDER — SODIUM CHLORIDE 0.9 % IR SOLN
Status: DC | PRN
  Administered 2018-02-08: 1000 mL

## 2018-02-08 MED ORDER — SENNOSIDES-DOCUSATE SODIUM 8.6-50 MG PO TABS
2.0000 | ORAL_TABLET | ORAL | Status: DC
  Administered 2018-02-09 – 2018-02-10 (×3): 2 via ORAL
  Filled 2018-02-08 (×3): qty 2

## 2018-02-08 MED ORDER — DEXTROSE 5 % IV SOLN
3.0000 g | INTRAVENOUS | Status: AC
  Administered 2018-02-08: 3 g via INTRAVENOUS
  Filled 2018-02-08: qty 3000

## 2018-02-08 MED ORDER — PROMETHAZINE HCL 25 MG/ML IJ SOLN: 6.2500 mg | INTRAMUSCULAR | Status: DC | PRN

## 2018-02-08 MED ORDER — DIPHENHYDRAMINE HCL 25 MG PO CAPS
25.0000 mg | ORAL_CAPSULE | Freq: Four times a day (QID) | ORAL | Status: DC | PRN
  Administered 2018-02-08: 25 mg via ORAL

## 2018-02-08 MED ORDER — ACETAMINOPHEN 10 MG/ML IV SOLN: 1000.0000 mg | Freq: Once | INTRAVENOUS | Status: DC | PRN

## 2018-02-08 MED ORDER — HYDROMORPHONE HCL 1 MG/ML IJ SOLN: 0.2500 mg | INTRAMUSCULAR | Status: DC | PRN

## 2018-02-08 MED ORDER — SOD CITRATE-CITRIC ACID 500-334 MG/5ML PO SOLN
ORAL | Status: AC
  Administered 2018-02-08: 30 mL
  Filled 2018-02-08: qty 15

## 2018-02-08 MED ORDER — OXYCODONE HCL 5 MG PO TABS
10.0000 mg | ORAL_TABLET | ORAL | Status: DC | PRN
  Administered 2018-02-09 – 2018-02-10 (×5): 10 mg via ORAL
  Filled 2018-02-08 (×6): qty 2

## 2018-02-08 MED ORDER — FENTANYL CITRATE (PF) 100 MCG/2ML IJ SOLN
INTRAMUSCULAR | Status: AC
  Filled 2018-02-08: qty 2

## 2018-02-08 MED ORDER — SIMETHICONE 80 MG PO CHEW
80.0000 mg | CHEWABLE_TABLET | ORAL | Status: DC | PRN
  Administered 2018-02-10: 80 mg via ORAL

## 2018-02-08 MED ORDER — IBUPROFEN 600 MG PO TABS
600.0000 mg | ORAL_TABLET | Freq: Four times a day (QID) | ORAL | Status: DC
  Administered 2018-02-08 – 2018-02-11 (×11): 600 mg via ORAL
  Filled 2018-02-08 (×9): qty 1

## 2018-02-08 MED ORDER — ONDANSETRON HCL 4 MG/2ML IJ SOLN: 4.0000 mg | Freq: Three times a day (TID) | INTRAMUSCULAR | Status: DC | PRN

## 2018-02-08 MED ORDER — ZOLPIDEM TARTRATE 5 MG PO TABS: 5.0000 mg | ORAL_TABLET | Freq: Every evening | ORAL | Status: DC | PRN

## 2018-02-08 MED ORDER — KETOROLAC TROMETHAMINE 30 MG/ML IJ SOLN
INTRAMUSCULAR | Status: AC
  Filled 2018-02-08: qty 1

## 2018-02-08 MED ORDER — BUPIVACAINE HCL (PF) 0.25 % IJ SOLN
INTRAMUSCULAR | Status: AC
  Filled 2018-02-08: qty 30

## 2018-02-08 MED ORDER — TETANUS-DIPHTH-ACELL PERTUSSIS 5-2.5-18.5 LF-MCG/0.5 IM SUSP: 0.5000 mL | Freq: Once | INTRAMUSCULAR | Status: DC

## 2018-02-08 MED ORDER — DEXTROSE 5 % IV SOLN
1.0000 ug/kg/h | INTRAVENOUS | Status: DC | PRN
  Filled 2018-02-08: qty 5

## 2018-02-08 MED ORDER — OXYTOCIN 10 UNIT/ML IJ SOLN
INTRAMUSCULAR | Status: AC
  Filled 2018-02-08: qty 4

## 2018-02-08 MED ORDER — OXYCODONE HCL 5 MG PO TABS
5.0000 mg | ORAL_TABLET | ORAL | Status: DC | PRN
  Administered 2018-02-09 – 2018-02-11 (×2): 5 mg via ORAL
  Filled 2018-02-08: qty 1

## 2018-02-08 MED ORDER — DEXTROSE 5 % IV SOLN
INTRAVENOUS | Status: AC
  Filled 2018-02-08: qty 3000

## 2018-02-08 MED ORDER — PRENATAL MULTIVITAMIN CH
1.0000 | ORAL_TABLET | Freq: Every day | ORAL | Status: DC
  Administered 2018-02-09 – 2018-02-11 (×3): 1 via ORAL
  Filled 2018-02-08 (×2): qty 1

## 2018-02-08 MED ORDER — NALBUPHINE HCL 10 MG/ML IJ SOLN: 5.0000 mg | Freq: Once | INTRAMUSCULAR | Status: DC | PRN

## 2018-02-08 MED ORDER — DIPHENHYDRAMINE HCL 25 MG PO CAPS
25.0000 mg | ORAL_CAPSULE | ORAL | Status: DC | PRN
  Filled 2018-02-08: qty 1

## 2018-02-08 MED ORDER — SIMETHICONE 80 MG PO CHEW
80.0000 mg | CHEWABLE_TABLET | ORAL | Status: DC
  Administered 2018-02-09 – 2018-02-10 (×3): 80 mg via ORAL
  Filled 2018-02-08 (×3): qty 1

## 2018-02-08 MED ORDER — MORPHINE SULFATE (PF) 0.5 MG/ML IJ SOLN
INTRAMUSCULAR | Status: AC
  Filled 2018-02-08: qty 10

## 2018-02-08 MED ORDER — SODIUM CHLORIDE 0.9% FLUSH: 3.0000 mL | INTRAVENOUS | Status: DC | PRN

## 2018-02-08 MED ORDER — EPHEDRINE SULFATE 50 MG/ML IJ SOLN
INTRAMUSCULAR | Status: DC | PRN
  Administered 2018-02-08: 10 mg via INTRAVENOUS

## 2018-02-08 MED ORDER — SIMETHICONE 80 MG PO CHEW
80.0000 mg | CHEWABLE_TABLET | Freq: Three times a day (TID) | ORAL | Status: DC
  Administered 2018-02-08 – 2018-02-10 (×5): 80 mg via ORAL
  Filled 2018-02-08 (×7): qty 1

## 2018-02-08 MED ORDER — LACTATED RINGERS IV SOLN
INTRAVENOUS | Status: DC | PRN
  Administered 2018-02-08: 08:00:00 via INTRAVENOUS

## 2018-02-08 MED ORDER — LACTATED RINGERS IV SOLN
INTRAVENOUS | Status: DC
  Administered 2018-02-08 (×2): via INTRAVENOUS

## 2018-02-08 MED ORDER — MENTHOL 3 MG MT LOZG: 1.0000 | LOZENGE | OROMUCOSAL | Status: DC | PRN

## 2018-02-08 MED ORDER — DIPHENHYDRAMINE HCL 50 MG/ML IJ SOLN: 12.5000 mg | INTRAMUSCULAR | Status: DC | PRN

## 2018-02-08 MED ORDER — ONDANSETRON HCL 4 MG/2ML IJ SOLN
INTRAMUSCULAR | Status: DC | PRN
  Administered 2018-02-08: 4 mg via INTRAVENOUS

## 2018-02-08 MED ORDER — PHENYLEPHRINE 8 MG IN D5W 100 ML (0.08MG/ML) PREMIX OPTIME
INJECTION | INTRAVENOUS | Status: AC
  Filled 2018-02-08: qty 100

## 2018-02-08 MED ORDER — PHENYLEPHRINE 8 MG IN D5W 100 ML (0.08MG/ML) PREMIX OPTIME
INJECTION | INTRAVENOUS | Status: DC | PRN
  Administered 2018-02-08: 6 ug/min via INTRAVENOUS

## 2018-02-08 MED ORDER — OXYTOCIN 40 UNITS IN LACTATED RINGERS INFUSION - SIMPLE MED
2.5000 [IU]/h | INTRAVENOUS | Status: AC
  Administered 2018-02-08: 2.5 [IU]/h via INTRAVENOUS

## 2018-02-08 MED ORDER — KETOROLAC TROMETHAMINE 30 MG/ML IJ SOLN
30.0000 mg | Freq: Once | INTRAMUSCULAR | Status: AC
  Administered 2018-02-08: 30 mg via INTRAMUSCULAR

## 2018-02-08 MED ORDER — EPHEDRINE 5 MG/ML INJ
INTRAVENOUS | Status: AC
  Filled 2018-02-08: qty 10

## 2018-02-08 MED ORDER — MORPHINE SULFATE (PF) 0.5 MG/ML IJ SOLN
INTRAMUSCULAR | Status: DC | PRN
  Administered 2018-02-08: .2 mg via INTRATHECAL

## 2018-02-08 MED ORDER — NALOXONE HCL 0.4 MG/ML IJ SOLN: 0.4000 mg | INTRAMUSCULAR | Status: DC | PRN

## 2018-02-08 MED ORDER — ACETAMINOPHEN 325 MG PO TABS
650.0000 mg | ORAL_TABLET | ORAL | Status: DC | PRN
  Administered 2018-02-09 – 2018-02-11 (×5): 650 mg via ORAL
  Filled 2018-02-08 (×6): qty 2

## 2018-02-08 MED ORDER — ONDANSETRON HCL 4 MG/2ML IJ SOLN
INTRAMUSCULAR | Status: AC
  Filled 2018-02-08: qty 2

## 2018-02-08 MED ORDER — ENOXAPARIN SODIUM 80 MG/0.8ML ~~LOC~~ SOLN
0.5000 mg/kg | SUBCUTANEOUS | Status: DC
  Filled 2018-02-08 (×4): qty 0.8

## 2018-02-08 MED ORDER — FENTANYL CITRATE (PF) 100 MCG/2ML IJ SOLN
INTRAMUSCULAR | Status: DC | PRN
  Administered 2018-02-08: 10 ug via INTRATHECAL

## 2018-02-08 MED ORDER — LACTATED RINGERS IV SOLN
INTRAVENOUS | Status: DC
  Administered 2018-02-08 – 2018-02-09 (×2): via INTRAVENOUS

## 2018-02-08 SURGICAL SUPPLY — 35 items
BENZOIN TINCTURE PRP APPL 2/3 (GAUZE/BANDAGES/DRESSINGS) ×3 IMPLANT
CHLORAPREP W/TINT 26ML (MISCELLANEOUS) ×3 IMPLANT
CLAMP CORD UMBIL (MISCELLANEOUS) IMPLANT
CLIP FILSHIE TUBAL LIGA STRL (Clip) ×3 IMPLANT
CLOSURE STERI STRIP 1/2 X4 (GAUZE/BANDAGES/DRESSINGS) ×3 IMPLANT
CLOTH BEACON ORANGE TIMEOUT ST (SAFETY) ×3 IMPLANT
DRSG OPSITE POSTOP 4X10 (GAUZE/BANDAGES/DRESSINGS) ×3 IMPLANT
ELECT REM PT RETURN 9FT ADLT (ELECTROSURGICAL) ×3
ELECTRODE REM PT RTRN 9FT ADLT (ELECTROSURGICAL) ×1 IMPLANT
EXTRACTOR VACUUM M CUP 4 TUBE (SUCTIONS) IMPLANT
EXTRACTOR VACUUM M CUP 4' TUBE (SUCTIONS)
GLOVE BIOGEL PI IND STRL 7.0 (GLOVE) ×3 IMPLANT
GLOVE BIOGEL PI INDICATOR 7.0 (GLOVE) ×6
GLOVE ECLIPSE 7.0 STRL STRAW (GLOVE) ×3 IMPLANT
GOWN STRL REUS W/TWL LRG LVL3 (GOWN DISPOSABLE) ×9 IMPLANT
KIT ABG SYR 3ML LUER SLIP (SYRINGE) IMPLANT
NEEDLE HYPO 22GX1.5 SAFETY (NEEDLE) ×3 IMPLANT
NEEDLE HYPO 25X5/8 SAFETYGLIDE (NEEDLE) IMPLANT
NS IRRIG 1000ML POUR BTL (IV SOLUTION) ×3 IMPLANT
PACK C SECTION WH (CUSTOM PROCEDURE TRAY) ×3 IMPLANT
PAD ABD 7.5X8 STRL (GAUZE/BANDAGES/DRESSINGS) ×6 IMPLANT
PAD OB MATERNITY 4.3X12.25 (PERSONAL CARE ITEMS) ×3 IMPLANT
PENCIL SMOKE EVAC W/HOLSTER (ELECTROSURGICAL) ×3 IMPLANT
RETRACTOR TRAXI PANNICULUS (MISCELLANEOUS) ×1 IMPLANT
RTRCTR C-SECT PINK 25CM LRG (MISCELLANEOUS) IMPLANT
SPONGE GAUZE 4X4 12PLY STER LF (GAUZE/BANDAGES/DRESSINGS) ×3 IMPLANT
STRIP CLOSURE SKIN 1/2X4 (GAUZE/BANDAGES/DRESSINGS) ×2 IMPLANT
SUT VIC AB 0 CTX 36 (SUTURE) ×6
SUT VIC AB 0 CTX36XBRD ANBCTRL (SUTURE) ×3 IMPLANT
SUT VIC AB 2-0 CT1 (SUTURE) ×6 IMPLANT
SUT VIC AB 4-0 KS 27 (SUTURE) ×3 IMPLANT
SYR 30ML LL (SYRINGE) ×3 IMPLANT
TOWEL OR 17X24 6PK STRL BLUE (TOWEL DISPOSABLE) ×3 IMPLANT
TRAXI PANNICULUS RETRACTOR (MISCELLANEOUS) ×2
TRAY FOLEY W/BAG SLVR 14FR LF (SET/KITS/TRAYS/PACK) ×3 IMPLANT

## 2018-02-08 NOTE — Op Note (Signed)
Jasmine Hudson PROCEDURE DATE: 02/08/2018  PREOPERATIVE DIAGNOSES: Intrauterine pregnancy at 185w2d weeks gestation; prior c-section x4; undesired fertility  POSTOPERATIVE DIAGNOSES: The same  PROCEDURE: Repeat Low Transverse Cesarean Section, Bilateral Tubal Sterilization using Filshie clips  SURGEON:  Dr. Tinnie Gensanya Pratt  ASSISTANT:  Dr. Caryl AdaJazma Phelps [facility](OB Fellow)  ANESTHESIOLOGIST: Dr. Bradley FerrisEllender, Catheryn Baconyan P, MD  INDICATIONS: Jasmine Hudson is a 27 y.o. [phone] at 4785w2d here for cesarean section and bilateral tubal sterilization secondary to the indications listed under preoperative diagnoses; please see preoperative note for further details.  The risks of surgery were discussed with the patient including but were not limited to: bleeding which may require transfusion or reoperation; infection which may require antibiotics; injury to bowel, bladder, ureters or other surrounding organs; injury to the fetus; need for additional procedures including hysterectomy in the event of a life-threatening hemorrhage; placental abnormalities wth subsequent pregnancies, incisional problems, thromboembolic phenomenon and other postoperative/anesthesia complications.  Patient also desires permanent sterilization.  Other reversible forms of contraception were discussed with patient; she declines all other modalities. Risks of procedure discussed with patient including but not limited to: risk of regret, permanence of method, bleeding, infection, injury to surrounding organs and need for additional procedures.  Failure risk of 1-2% with increased risk of ectopic gestation if pregnancy occurs was also discussed with patient.  The patient concurred with the proposed plan, giving informed written consent for the procedures.    FINDINGS:  Viable female infant in cephalic presentation.  Apgars 9 and 9.  Meconium stained amniotic fluid.  Intact placenta, three vessel cord.  Normal uterus, fallopian tubes and ovaries bilaterally.  Fallopian tubes sterilized with Filshie clips bilaterally.  ANESTHESIA: Spinal INTRAVENOUS FLUIDS: 2000 ml ESTIMATED BLOOD LOSS: 442 ml URINE OUTPUT:  100  ml SPECIMENS: Placenta sent to L&D COMPLICATIONS: None immediate  PROCEDURE IN DETAIL:  The patient preoperatively received intravenous antibiotics and had sequential compression devices applied to her lower extremities. She was then taken to the operating room where spinal anesthesia was administered and was found to be adequate. She was then placed in a dorsal supine position with a leftward tilt, and prepped and draped in a sterile manner.  A foley catheter was placed into her bladder and attached to constant gravity.  After an adequate timeout was performed, a Pfannenstiel skin incision was made with scalpel above her preexisting scar and carried through to the underlying layer of fascia. The fascia was incised in the midline, and this incision was extended bilaterally using the Mayo scissors.  Kocher clamps were applied to the superior aspect of the fascial incision and the underlying rectus muscles were dissected off bluntly. A similar process was carried out on the inferior aspect of the fascial incision. The rectus muscles were separated in the midline bluntly and the peritoneum was entered bluntly. Attention was turned to the lower uterine segment where a low transverse hysterotomy was made with a scalpel and extended bilaterally bluntly.  The infant was successfully delivered, the cord was clamped and cut after one minute, and the infant was handed over to awaiting neonatology team. Uterine massage was then administered, and the placenta delivered intact with a three-vessel cord. The uterus was then cleared of clots and debris.  The hysterotomy was closed in single layer with 0 Vicryl in a running locked fashion. Hemostasis was appreciated.  Attention was then turned to the fallopian tubes, and Filshie clips were placed about 3 cm from the  cornua, with care given to incorporate the  underlying mesosalpinx on both sides, allowing for bilateral tubal sterilization. The fallopian tubes were injected with .25% Marcaine around Filshie clip. The pelvis was cleared of all clot and debris. Hemostasis was confirmed on all surfaces. The peritoneum was closed with a 0 Vicryl running stitch. The fascia was then closed using 0 Vicryl in a running fashion. The subcutaneous layer was irrigated, then reapproximated with 2-0 plain gut in running fashion and 30 ml of 0.25% Marcaine was injected subcutaneously around the incision. The skin was closed with a 4-0 Vicryl subcuticular stitch. The patient tolerated the procedure well. Sponge, lap, instrument and needle counts were correct x 3.  She was taken to the recovery room in stable condition.   Caryl Ada, DO OB Fellow Center for [facility], [facility]

## 2018-02-08 NOTE — Anesthesia Preprocedure Evaluation (Addendum)
Anesthesia Evaluation  Patient identified by MRN, date of birth, ID band Patient awake    Reviewed: Allergy & Precautions, NPO status , Patient's Chart, lab work & pertinent test results  Airway Mallampati: II  TM Distance: >3 FB Neck ROM: Full    Dental  (+) Chipped,    Pulmonary neg pulmonary ROS, former smoker,    Pulmonary exam normal breath sounds clear to auscultation       Cardiovascular negative cardio ROS Normal cardiovascular exam Rhythm:Regular Rate:Normal     Neuro/Psych negative neurological ROS  negative psych ROS   GI/Hepatic negative GI ROS, Neg liver ROS,   Endo/Other  Morbid obesity  Renal/GU negative Renal ROS     Musculoskeletal negative musculoskeletal ROS (+)   Abdominal (+) + obese,   Peds  Hematology negative hematology ROS (+)   Anesthesia Other Findings RCS Undesired Fertility  Reproductive/Obstetrics (+) Pregnancy                            Anesthesia Physical Anesthesia Plan  ASA: III  Anesthesia Plan: Spinal   Post-op Pain Management:    Induction: Intravenous  PONV Risk Score and Plan: 2 and Ondansetron, Scopolamine patch - Pre-op, Treatment may vary due to age or medical condition and Dexamethasone  Airway Management Planned:   Additional Equipment:   Intra-op Plan:   Post-operative Plan:   Informed Consent: I have reviewed the patients History and Physical, chart, labs and discussed the procedure including the risks, benefits and alternatives for the proposed anesthesia with the patient or authorized representative who has indicated his/her understanding and acceptance.   Dental advisory given  Plan Discussed with: CRNA  Anesthesia Plan Comments:        Anesthesia Quick Evaluation

## 2018-02-08 NOTE — Interval H&P Note (Signed)
History and Physical Interval Note:  02/08/2018 6:56 AM  Jasmine Hudson  has presented today for surgery, with the diagnosis of RCS Undesired Fertility  The various methods of treatment have been discussed with the patient and family. After consideration of risks, benefits and other options for treatment, the patient has consented to  Procedure(s): REPEAT CESAREAN SECTION WITH BILATERAL TUBAL LIGATION (Bilateral) as a surgical intervention .  The patient's history has been reviewed, patient examined, no change in status, stable for surgery.  I have reviewed the patient's chart and labs.  Questions were answered to the patient's satisfaction.     Reva Boresanya S Anapaula Severt

## 2018-02-08 NOTE — Progress Notes (Signed)
RN called pt at 0600 and left a voice mail for patient about her pre op appointment. Patient called back at 0615 and said she is running late.

## 2018-02-08 NOTE — Transfer of Care (Signed)
Immediate Anesthesia Transfer of Care Note  Patient: Alisia FerrariKaalae T Danner  Procedure(s) Performed: REPEAT CESAREAN SECTION WITH BILATERAL TUBAL LIGATION (Bilateral )  Patient Location: PACU  Anesthesia Type:Spinal  Level of Consciousness: awake and alert   Airway & Oxygen Therapy: Patient Spontanous Breathing  Post-op Assessment: Report given to RN and Post -op Vital signs reviewed and stable  Post vital signs: Reviewed  Last Vitals:  Vitals Value Taken Time  BP    Temp    Pulse 85 02/08/2018  9:08 AM  Resp    SpO2 100 % 02/08/2018  9:08 AM  Vitals shown include unvalidated device data.  Last Pain:  Vitals:   02/08/18 0648  TempSrc: Oral         Complications: No apparent anesthesia complications

## 2018-02-08 NOTE — Progress Notes (Signed)
[facility] 1 hour VS/assessment, pt refused care.  Pt educated on reason for hourly VS after RCS and increased of bleeding or complications.  Pt refuses care at this time.

## 2018-02-08 NOTE — Anesthesia Postprocedure Evaluation (Signed)
Anesthesia Post Note  Patient: Jasmine Hudson  Procedure(s) Performed: REPEAT CESAREAN SECTION WITH BILATERAL TUBAL LIGATION (Bilateral )     Patient location during evaluation: Mother Baby Anesthesia Type: Spinal Level of consciousness: awake and alert Pain management: pain level controlled Vital Signs Assessment: post-procedure vital signs reviewed and stable Respiratory status: spontaneous breathing Cardiovascular status: stable Postop Assessment: no headache, patient able to bend at knees, no apparent nausea or vomiting and spinal receding Anesthetic complications: no Comments: Pain score 3.     Last Vitals:  Vitals:   02/08/18 1218 02/08/18 1353  BP: 118/75 (!) 111/56  Pulse: 62 71  Resp: 18 17  Temp: 36.6 C 36.7 C  SpO2: 99% 98%    Last Pain:  Vitals:   02/08/18 1353  TempSrc: Axillary  PainSc: 2    Pain Goal:                 [facility],Kentrell Hallahan

## 2018-02-08 NOTE — Anesthesia Procedure Notes (Signed)
Spinal  Patient location during procedure: OR Start time: 02/08/2018 7:45 AM End time: 02/08/2018 7:55 AM Staffing Anesthesiologist: Leonides GrillsEllender, Jesstin Studstill P, MD Performed: anesthesiologist  Preanesthetic Checklist Completed: patient identified, surgical consent, pre-op evaluation, timeout performed, IV checked, risks and benefits discussed and monitors and equipment checked Spinal Block Patient position: sitting Prep: DuraPrep Patient monitoring: cardiac monitor, continuous pulse ox and blood pressure Approach: midline Location: L5-S1 Injection technique: single-shot Needle Needle type: Pencan  Needle gauge: 24 G Needle length: 9 cm Assessment Sensory level: T10 Additional Notes Functioning IV was confirmed and monitors were applied. Sterile prep and drape, including hand hygiene and sterile gloves were used. The patient was positioned and the spine was prepped. The skin was anesthetized with lidocaine.  Free flow of clear CSF was obtained on the second attempt prior to injecting local anesthetic into the CSF.  The spinal needle aspirated freely following injection.  The needle was carefully withdrawn.  The patient tolerated the procedure well.

## 2018-02-09 LAB — CBC
HEMATOCRIT: 32 % — AB (ref 36.0–46.0)
Hemoglobin: 10.3 g/dL — ABNORMAL LOW (ref 12.0–15.0)
MCH: 26.8 pg (ref 26.0–34.0)
MCHC: 32.2 g/dL (ref 30.0–36.0)
MCV: 83.3 fL (ref 78.0–100.0)
Platelets: 255 10*3/uL (ref 150–400)
RBC: 3.84 MIL/uL — ABNORMAL LOW (ref 3.87–5.11)
RDW: 15.2 % (ref 11.5–15.5)
WBC: 7.7 10*3/uL (ref 4.0–10.5)

## 2018-02-09 MED ORDER — MEASLES, MUMPS & RUBELLA VAC ~~LOC~~ INJ
0.5000 mL | INJECTION | Freq: Once | SUBCUTANEOUS | Status: DC
  Filled 2018-02-09: qty 0.5

## 2018-02-09 NOTE — Progress Notes (Signed)
POSTPARTUM PROGRESS NOTE  POD #1  Subjective:  Jasmine Hudson is a 27 y.o. [id number] s/p LTC/BTLS at 7136w2d.  She reports she doing well. No acute events overnight. She reports she is doing well. She denies any problems with ambulating, voiding or po intake. Denies nausea or vomiting. She has not passed flatus. Pain is well controlled.  Lochia is small.  Objective: Blood pressure 124/74, pulse 76, temperature 98.1 F (36.7 C), temperature source Oral, resp. rate 18, height 5\' 7"  (1.702 m), weight 286 lb 14.4 oz (130.1 kg), last menstrual period 05/09/2017, SpO2 98 %, unknown if currently breastfeeding.  Physical Exam:  General: alert, cooperative and no distress Chest: no respiratory distress Heart:regular rate, distal pulses intact Abdomen: soft, nontender,  Uterine Fundus: firm, appropriately tender DVT Evaluation: No calf swelling or tenderness Extremities: no edema Skin: warm, dry; pressure dressing in place w/o saturation  Recent Labs    02/08/18 1051 02/09/18 0529  HGB 11.6* 10.3*  HCT 35.6* 32.0*    Assessment/Plan: Jasmine FerrariKaalae T Knapke is a 27 y.o. [id number] s/p RCS/BTL  POD#1 - Doing welll; pain well controlled. H/H appropriate  Routine postpartum care  Remove pressure dressing  OOB, ambulate  Lovenox for VTE prophylaxis  Contraception: s/p BTL  Dispo: Plan for discharge tomorrow.   LOS: 1 day   Kandra NicolasJulie P DegeleMD 02/09/2018, 8:48 AM

## 2018-02-09 NOTE — Progress Notes (Signed)
Pt refused MMR and TDAP vaccine because she "does not believe in vaccines for myself or my baby."  Risk of pertussis and measles to mother and baby discussed, benefit of "herd immunity" for baby discussed with pt.  Pt unsure at this time, declines MMR and TDAP but will revisit prior to DC home.

## 2018-02-09 NOTE — Progress Notes (Signed)
Lovenox refused by patient because she is  "tired of needles" and has never received it in prior pregnancies.  Pt educated on ris/benefit of refusal discussed with specific education on  increased risk of PE and blood clots after childbirth and surgery.  Pt verbalizes understanding and again refused.  Pt encouraged to ambulate frequently and to wear SCD when in bed to decrease risk of developing blood clots.  Pt verbalizes understanding.

## 2018-02-10 ENCOUNTER — Encounter (HOSPITAL_COMMUNITY): Payer: Self-pay

## 2018-02-10 NOTE — Lactation Note (Signed)
This note was copied from a baby's chart. Lactation Consultation Note  Patient Name: Jasmine Hudson'UToday's Date: 02/10/2018 Reason for consult: Initial assessment  LC entered room Mom was asleep,  Per dad,  mom was given pain medication and infant was given formula.  Per dad,  mom will call LC prn.   Maternal Data Does the patient have breastfeeding experience prior to this delivery?: Yes  Feeding    LATCH Score                   Interventions    Lactation Tools Discussed/Used     Consult Status Consult Status: Follow-up    Danelle EarthlyRobin Hazelene Doten 02/10/2018, 7:53 PM

## 2018-02-10 NOTE — Lactation Note (Signed)
This note was copied from a baby's chart. [facility] with this mother.  Mother was very upset that she didn't get her "whole case" of formula.  I explained our policy of supplying one bottle at a time for babies who are breast fed to support successful breastfeeding.  The mother then asked "OK, so can I get the rest of my case?"  I told her that I would get our ChiropodistAssistant Director and would come back and speak with her in her room.  When the assistant director and myself again explained that the RN was following our correct policy, the mother asked to speak to the manager.  Danella Deisatty Phillips explained she is the Chiropodistassistant director on the unit.  The other responded that she wanted to talk to the "Head Manager".  She then said that the head director saw her during the day and she would call her.  She then went in her room and closed the door.  Two more bottles were taken to the mother's room per our assistant director.  The mother said "No thank you, I will call for formula when I need more."  I then told the mother to cal out and ask to speak to the charge RN if she had any problems receiving formula.

## 2018-02-10 NOTE — Progress Notes (Signed)
Subjective: Postpartum Day 2: Cesarean Delivery Patient reports incisional pain, tolerating PO and no problems voiding.    Objective: Vital signs in last 24 hours: Temp:  [97.8 F (36.6 C)-98.3 F (36.8 C)] 98.1 F (36.7 C) (07/01 0536) Pulse Rate:  [80-92] 80 (07/01 0536) Resp:  [18] 18 (07/01 0536) BP: (128-135)/(62-88) 128/71 (07/01 0536) SpO2:  [99 %] 99 % (06/30 2230)  Physical Exam:  General: alert, cooperative and no distress Lochia: appropriate Uterine Fundus: firm Incision: no significant drainage DVT Evaluation: No evidence of DVT seen on physical exam. No cords or calf tenderness.  Recent Labs    02/08/18 1051 02/09/18 0529  HGB 11.6* 10.3*  HCT 35.6* 32.0*    Assessment/Plan: Status post Cesarean section. Doing well postoperatively.  Continue current care Plan for discharge tomorrow.  Jasmine AdaJazma Roi Jafari, DO 02/10/2018, 11:11 AM

## 2018-02-10 NOTE — Progress Notes (Signed)
2030 Mother came out of room complaining that she was not given "a full case" of formula when requested. Explained that we give formula one bottle at a time. Mother very belligerently stated that she had been getting cases all along and why is this suddenly a problem. Explained again that this is our policy. Charge nurse behind desk dealing with another issue. Asked her to talk to patient.

## 2018-02-10 NOTE — Lactation Note (Signed)
This note was copied from a baby's chart. Lactation Consultation Note  Patient Name: Jasmine Hudson ZOXWR'UToday's Date: 02/10/2018 Reason for consult: Initial assessment  Initial lactation visit at 57 hours of life. Mom is a P4 who nursed her last child (now 544yo) for 1 year. Mom reports an abundant supply with her last child ("like a cow," per Mom). Mom was not forthcoming with how long she nursed her first 2 children. Mom reports + breast changes w/this pregnancy & had leakage during this pregnancy.   Mom is disappointed that her milk has not come in, yet. With her other children, her milk was in on the 1st day postpartum. Mom says she's not getting any colostrum with hand expression or pumping. Hand expression technique was reviewed by Danelle Earthlyobin Ollison, IBCLC. No colostrum was yielded.   Mom says she is putting infant to the L breast for about 15 min and then offering a bottle ([employer]). Importance of breast stimulation discussed. Mom is agreeable to pumping the R breast to assist in laying down the prolactin receptors.   Mom would like us to return in 1 hr, but Mom has our # to call, also.     Jasmine Hudson, Jasmine Hudson [facility] 02/10/2018, 5:42 PM

## 2018-02-10 NOTE — Progress Notes (Addendum)
Patient has complained of 7-10 pain through out shift.  She is stating that the pain is throbbing on the right side of her incision. She says it is on going and seems to have gotten worse when her pressure dressing was removed earlier in the day.  Patient had been declining her Motrin stating that it was not helping.  Encouraged her to take it on schedule as Dr ordered to help manage pain along with Oxy IR and Tylenol as needed. Patient agreed to take motrin as scheduled.  With Motrin, Tylenol, and Oxy Ir pain has been reduced from 10 to 5 at this time. Also assisted her to apply abdominal dressing and offered heat and cold packs.  Patient denied Ice and Heat at the time.

## 2018-02-11 ENCOUNTER — Encounter (HOSPITAL_COMMUNITY): Payer: Self-pay | Admitting: Advanced Practice Midwife

## 2018-02-11 MED ORDER — IBUPROFEN 600 MG PO TABS: 600.0000 mg | ORAL_TABLET | Freq: Four times a day (QID) | ORAL | 0 refills | Status: DC | PRN

## 2018-02-11 MED ORDER — OXYCODONE HCL 5 MG PO TABS: 5.0000 mg | ORAL_TABLET | ORAL | 0 refills | Status: DC | PRN

## 2018-02-11 NOTE — Discharge Instructions (Signed)

## 2018-02-11 NOTE — Discharge Summary (Addendum)
OB Discharge Summary     Patient Name: Jasmine Hudson DOB: 25-Jan-1991 MRN: [id number]  Date of admission: 02/08/2018 Delivering MD: Reva Bores   Date of discharge: 02/11/2018  Admitting diagnosis: RCS Undesired Fertility Intrauterine pregnancy: [redacted]w[redacted]d     Secondary diagnosis:  Principal Problem:   Previous cesarean section Active Problems:   Herpes   Pyelonephritis affecting pregnancy in first trimester   Recurrent UTI   Rubella non-immune status, antepartum   Group B Streptococcus carrier, +RV culture, currently pregnant   Status post repeat low transverse cesarean section  Additional problems: none     Discharge diagnosis: Term Pregnancy Delivered                                                                                                Post partum procedures:postpartum tubal ligation  Augmentation: none.  repeat c/section  Complications: None  Hospital course:  Scheduled C/S & BTL   27 y.o. yo [id number] at [redacted]w[redacted]d was admitted to the hospital 02/08/2018 for scheduled cesarean section with the following indication:Elective Repeat.  Membrane Rupture Time/Date: 8:18 AM ,02/08/2018   Patient delivered a Viable infant.02/08/2018  Details of operation can be found in separate operative note.  Pateint had an uncomplicated postpartum course.  She is ambulating, tolerating a regular diet, passing flatus, and urinating well. Patient is discharged home in stable condition on  02/11/18         Physical exam  Vitals:   02/09/18 2230 02/10/18 0536 02/10/18 1521 02/11/18 0530  BP: 128/62 128/71 (!) 118/50 140/78  Pulse: 92 80 70 88  Resp: 18 18 18 18   Temp: 98.3 F (36.8 C) 98.1 F (36.7 C) 98 F (36.7 C) 98.3 F (36.8 C)  TempSrc: Oral Oral Oral Oral  SpO2: 99%     Weight:      Height:       General: alert Lochia: appropriate Uterine Fundus: firm Incision: Healing well with no significant drainage DVT Evaluation: No evidence of DVT seen on physical exam. Labs: Lab  Results  Component Value Date   WBC 7.7 02/09/2018   HGB 10.3 (L) 02/09/2018   HCT 32.0 (L) 02/09/2018   MCV 83.3 02/09/2018   PLT 255 02/09/2018   CMP Latest Ref Rng & Units 02/08/2018  Glucose 65 - 99 mg/dL -  BUN 6 - 20 mg/dL -  Creatinine 1.47 - 8.29 mg/dL 5.62  Sodium 130 - 865 mmol/L -  Potassium 3.5 - 5.1 mmol/L -  Chloride 101 - 111 mmol/L -  CO2 22 - 32 mmol/L -  Calcium 8.9 - 10.3 mg/dL -  Total Protein 6.5 - 8.1 g/dL -  Total Bilirubin 0.3 - 1.2 mg/dL -  Alkaline Phos 38 - 784 U/L -  AST 15 - 41 U/L -  ALT 14 - 54 U/L -    Discharge instruction: per After Visit Summary and "Baby and Me Booklet".  After visit meds:  Allergies as of 02/11/2018      Reactions   Apple Itching, Swelling   Red Apples   Banana Itching, Swelling  Medication List    TAKE these medications   ibuprofen 600 MG tablet Commonly known as:  ADVIL,MOTRIN Take 1 tablet (600 mg total) by mouth every 6 (six) hours as needed.   oxyCODONE 5 MG immediate release tablet Commonly known as:  Oxy IR/ROXICODONE Take 1 tablet (5 mg total) by mouth every 4 (four) hours as needed (pain scale 4-7).       Diet: routine diet  Activity: Advance as tolerated. Pelvic rest for 6 weeks.   Outpatient follow up:1 week for incision cvheck and 4 weeks for post partum visit Follow up Appt:No future appointments. Follow up Visit:No follow-ups on file.  Postpartum contraception: Tubal Ligation- completed  Newborn Data: Live born female  Birth Weight: 8 lb 1.6 oz (3675 g) APGAR: 9, 9  Newborn Delivery   Birth date/time:  02/08/2018 08:18:00 Delivery type:  C-Section, Low Transverse Trial of labor:  No C-section categorization:  Repeat     Baby Feeding: Bottle and Breast Disposition:home with mother   02/11/2018 Sandre Kittyaniel K Olson, MD PGY-1  CNM attestation I have seen and examined this patient and agree with above documentation in the resident's note.   Jasmine Hudson is a 27 y.o. [id number] s/p  rLTCS and BTL.   Pain is well controlled.  Plan for birth control is bilateral tubal ligation.  Method of Feeding: both  PE:  BP 140/78 (BP Location: Right Arm)   Pulse 88   Temp 98.3 F (36.8 C) (Oral)   Resp 18   Ht 5\' 7"  (1.702 m)   Wt 130.1 kg (286 lb 14.4 oz)   LMP 05/09/2017   SpO2 99%   Breastfeeding? Unknown   BMI 44.93 kg/m  Fundus firm  Recent Labs    02/08/18 1051 02/09/18 0529  HGB 11.6* 10.3*  HCT 35.6* 32.0*     Plan: discharge today - postpartum care discussed - f/u clinic in 1wk for incision check and 4 weeks for postpartum visit   Cam HaiSHAW, Quincee Gittens, CNM 9:44 AM  02/11/2018

## 2018-02-11 NOTE — Lactation Note (Signed)
This note was copied from a baby's chart. Lactation Consultation Note: Mother reports that she is unsure of her intentions on bottle or breast. Mother reports that her milk is not in yet. mother is mostly bottle feeding and not pumping her breast. Mother reports that she has had experience with breastfeeding and doesn't have any breastfeeding questions or concerns.  Discussed treatment and prevention of engorgement. Mother is aware of available LC services and community support.   Patient Name: Jasmine Hudson [place] ZOXWR'UToday's Date: 02/11/2018 Reason for consult: Follow-up assessment   Maternal Data    Feeding    LATCH Score                   Interventions Interventions: Breast massage;Ice(mother unsure of bf goals . discussed tx for engorgement)  Lactation Tools Discussed/Used     Consult Status Consult Status: Complete    Michel BickersKendrick, Eisa Necaise McCoy 02/11/2018, 11:50 AM

## 2018-02-11 NOTE — Plan of Care (Signed)
Progressing. Encouraged to call for assistance as needed, and for LATCH assessment. 

## 2018-02-11 NOTE — Progress Notes (Signed)
CSW received consult for Edinburgh Postnatal Depression Scale score of 9, which does not meet criteria for automatic CSW consult.  Please contact CSW if concerns arise or by MOB's request.  CSW is screening out referral at this time. 

## 2018-02-17 ENCOUNTER — Telehealth: Payer: Self-pay | Admitting: General Practice

## 2018-02-17 NOTE — Telephone Encounter (Signed)
[facility] into office stating she performed a home visit on the patient earlier today. The patient delivered on 6/29. The patient was reporting severe pain in her abdomen with urination & patient has a history of pyleo & kidney disease. Told her we would reach out to the patient.  Called patient, no answer- left message on her voicemail stating we are trying to reach you regarding a phone call we received from the [facility] nurse, please call us back.

## 2018-02-20 NOTE — Telephone Encounter (Signed)
Per chart review, patient has incision check appt tomorrow- will perform UA then.

## 2018-02-21 ENCOUNTER — Ambulatory Visit: Payer: Self-pay

## 2018-02-25 ENCOUNTER — Ambulatory Visit (INDEPENDENT_AMBULATORY_CARE_PROVIDER_SITE_OTHER): Payer: Medicaid Other

## 2018-02-25 VITALS — BP 125/74 | HR 79 | Wt 265.5 lb

## 2018-02-25 DIAGNOSIS — Z5189 Encounter for other specified aftercare: Secondary | ICD-10-CM

## 2018-02-25 NOTE — Progress Notes (Signed)
I have reviewed the chart and agree with nursing staff's documentation of this patient's encounter.  Logan Vegh, CNM 02/25/2018 5:54 PM    

## 2018-02-25 NOTE — Progress Notes (Signed)
Pt here today for incision s/p c-section x 4.  Pt reports minimal pain.  Incision well approximated, no redness, no drainage, and no odor.  Pt advised on how to continue to monitor for infection and to take care of wound.  Pt informed of pp visit. Pt stated understanding.

## 2018-03-24 ENCOUNTER — Encounter: Payer: Self-pay | Admitting: Family Medicine

## 2018-03-24 ENCOUNTER — Ambulatory Visit: Payer: Self-pay | Admitting: Family Medicine

## 2018-03-24 NOTE — Progress Notes (Signed)
Patient did not keep appointment today. She will be called to reschedule.  

## 2019-12-20 ENCOUNTER — Encounter (HOSPITAL_BASED_OUTPATIENT_CLINIC_OR_DEPARTMENT_OTHER): Payer: Self-pay

## 2019-12-20 ENCOUNTER — Other Ambulatory Visit: Payer: Self-pay

## 2019-12-20 ENCOUNTER — Encounter (HOSPITAL_COMMUNITY): Payer: Self-pay

## 2019-12-20 ENCOUNTER — Emergency Department (HOSPITAL_BASED_OUTPATIENT_CLINIC_OR_DEPARTMENT_OTHER)
Admission: EM | Admit: 2019-12-20 | Discharge: 2019-12-21 | Disposition: A | Discharge status: Home / Self Care | Payer: PRIVATE HEALTH INSURANCE | Attending: Emergency Medicine | Admitting: Emergency Medicine

## 2019-12-20 ENCOUNTER — Ambulatory Visit (HOSPITAL_COMMUNITY)
Admission: EM | Admit: 2019-12-20 | Discharge: 2019-12-20 | Disposition: A | Discharge status: Home / Self Care | Payer: Medicaid Other | Attending: Emergency Medicine | Admitting: Emergency Medicine

## 2019-12-20 DIAGNOSIS — Z91018 Allergy to other foods: Secondary | ICD-10-CM | POA: Diagnosis not present

## 2019-12-20 DIAGNOSIS — Z87891 Personal history of nicotine dependence: Secondary | ICD-10-CM | POA: Insufficient documentation

## 2019-12-20 DIAGNOSIS — M79609 Pain in unspecified limb: Secondary | ICD-10-CM

## 2019-12-20 DIAGNOSIS — M25561 Pain in right knee: Secondary | ICD-10-CM | POA: Insufficient documentation

## 2019-12-20 MED ORDER — KETOROLAC TROMETHAMINE 30 MG/ML IJ SOLN
30.0000 mg | Freq: Once | INTRAMUSCULAR | Status: AC
  Administered 2019-12-20: 30 mg via INTRAMUSCULAR
  Filled 2019-12-20: qty 1

## 2019-12-20 MED ORDER — IBUPROFEN 600 MG PO TABS: 600.0000 mg | ORAL_TABLET | Freq: Four times a day (QID) | ORAL | 0 refills | Status: DC | PRN

## 2019-12-20 MED ORDER — HYDROCODONE-ACETAMINOPHEN 5-325 MG PO TABS: 1.0000 | ORAL_TABLET | Freq: Four times a day (QID) | ORAL | 0 refills | Status: DC | PRN

## 2019-12-20 NOTE — ED Provider Notes (Signed)
HPI  SUBJECTIVE:  Jasmine Hudson is a 29 y.o. female who presents with 1 to 2 weeks of constant right lateral posterior knee pain that occasionally radiates up her buttock and down her foot.  She denies trauma, change in her physical activity.  Patient states that she is unable to bend her knee.  She denies fevers, calf swelling, back pain.  No lower extremity weakness, joint swelling, erythema.  No rash over her lower extremity.  No surgery in the past 4 weeks, recent immobilization, hemoptysis, shortness of breath.  States that her hip and ankle are fine.  She has never had symptoms like this before.  She tried BC powders, icy hot and ice.  BC powder seemed to help.  Symptoms are worse with bending her knee, standing, and with application of ice.   Denies history of kidney disease.  No history of osteoarthritis,  rheumatoid arthritis, right knee injury, sciatica, low back injury, DVT, PE, gout.  No history of hypercoagulability, OCP use.  LMP: Denies the possibility being pregnant, status post bilateral tubal ligation.  PMD: Cannot remember.   Past Medical History:  Diagnosis Date  . Chronic kidney disease   . Herpes   . Pyelonephritis affecting pregnancy 2013  . Trichomoniasis 09/18/2013   09/18/13 Phone number disconnected > requested letter to be sent or inform at next visit     Past Surgical History:  Procedure Laterality Date  . CESAREAN SECTION  2007  . CESAREAN SECTION  02/29/2012   Procedure: CESAREAN SECTION;  Surgeon: Tilda Burrow, MD;  Location: [facility];  Service: Gynecology;  Laterality: N/A;  Repeat   . CESAREAN SECTION N/A 10/23/2013   Procedure: CESAREAN SECTION;  Surgeon: Reva Bores, MD;  Location: [facility];  Service: Obstetrics;  Laterality: N/A;  Small abrasion located on the left lower lateral of the abdomen.  Dermabond applied to the abrasion by Dr. Reola Calkins.  See OR note.  Marland Kitchen CESAREAN SECTION WITH BILATERAL TUBAL LIGATION Bilateral 02/08/2018   Procedure: REPEAT CESAREAN  SECTION WITH BILATERAL TUBAL LIGATION;  Surgeon: Reva Bores, MD;  Location: [facility]TH[facility] SU[facility];  Service: Obstetrics;  Laterality: Bilateral;  . INDUCED ABORTION      Family History  Problem Relation Age of Onset  . Hypertension Mother   . Hypertension Maternal Grandfather     Social History   Tobacco Use  . Smoking status: Former Smoker    Types: Cigars    Quit date: 08/13/2010    Years since quitting: 9.3  . Smokeless tobacco: Never Used  Substance Use Topics  . Alcohol use: No    Comment: ocassionally  . Drug use: No    No current facility-administered medications for this encounter.  Current Outpatient Medications:  .  HYDROcodone-acetaminophen (NORCO/VICODIN) 5-325 MG tablet, Take 1-2 tablets by mouth every 6 (six) hours as needed for moderate pain or severe pain., Disp: 12 tablet, Rfl: 0 .  ibuprofen (ADVIL) 600 MG tablet, Take 1 tablet (600 mg total) by mouth every 6 (six) hours as needed., Disp: 30 tablet, Rfl: 0  Allergies  Allergen Reactions  . Apple Itching and Swelling    Red Apples  . Banana Itching and Swelling     ROS  As noted in HPI.   Physical Exam  BP 129/87 (BP Location: Left Arm)   Pulse 89   Temp 98.2 F (36.8 C) (Oral)   Resp 20   SpO2 99%   Constitutional: Well developed, well nourished, no acute distress Eyes:  EOMI, conjunctiva normal bilaterally HENT: Normocephalic, atraumatic,mucus membranes moist Respiratory: Normal inspiratory effort Cardiovascular: Normal rate GI: nondistended skin: No rash, skin intact Musculoskeletal: Calves symmetric.  Exquisite tenderness in the right popliteal region.  No tenderness over the gastrox.  No palpable cord.  No tenderness over the medial thigh.  No appreciable popliteal asymmetry.  Sensation and motor distally intact.  DP 2+.  Pain with knee flexion.  No pain with extension.  No tenderness along the medial/lateral knee joint.  No tenderness over the patellar tendon, patella.  No obvious knee  effusion.  No joint erythema, edema.  No increased temperature.  MCL/LCL stable on varus/valgus stress.  McMurray negative.  Anterior drawer test negative.  Patient able to bear weight in the department. No tenderness over the L-spine, no paralumbar tenderness.  No tenderness over the sciatic notch, buttock. Neurologic: Alert & oriented x 3, no focal neuro deficits Psychiatric: Speech and behavior appropriate   ED Course   Medications - No data to display  No orders of the defined types were placed in this encounter.   No results found for this or any previous visit (from the past 24 hour(s)). No results found.  ED Clinical Impression  1. Popliteal pain      ED Assessment/Plan  [place] Narcotic database reviewed for this patient, and feel that the risk/benefit ratio today is favorable for proceeding with a prescription for controlled substance.  No opiate prescriptions in 2 years.  Suspect Baker's cyst.  DVT in the differential.  I have ordered an outpatient ultrasound to be done tomorrow to evaluate for these 2 entities.   Doubt gout, septic arthritis.  Her knee is stable.  She has no history of trauma, deferred x-rays.  Gave patient the number so that she can schedule the ultrasound.  Discussed with her that the order expires in a week.  Gave her written and verbal instructions that she is to remain in the ultrasound area until I speak with either the tech or the radiologist regarding her results and then I will talk to her regarding the results and next steps.  If she has a DVT, she will go to the ED to be started on anticoagulation.  If she has a Baker's cyst, then she will follow-up with Dr.Xu, orthopedics on call.    She is to take ibuprofen with a Tylenol containing product 3-4 times a day as needed for pain.  Ibuprofen/1000 g plain Tylenol for mild to moderate pain, ibuprofen/1-2 Norco for severe pain.  Discussed with patient that it was fine to go to the emergency department tonight  if she had any concerns.  Discussed  MDM, treatment plan, and plan for follow-up with patient. Discussed sn/sx that should prompt return to the ED. patient agrees with plan.   Meds ordered this encounter  Medications  . ibuprofen (ADVIL) 600 MG tablet    Sig: Take 1 tablet (600 mg total) by mouth every 6 (six) hours as needed.    Dispense:  30 tablet    Refill:  0  . HYDROcodone-acetaminophen (NORCO/VICODIN) 5-325 MG tablet    Sig: Take 1-2 tablets by mouth every 6 (six) hours as needed for moderate pain or severe pain.    Dispense:  12 tablet    Refill:  0    *This clinic note was created using [employer]. Therefore, there may be occasional mistakes despite careful proofreading.   ?    Melynda Ripple, MD 12/20/19 [phone]

## 2019-12-20 NOTE — Discharge Instructions (Addendum)
I think you have a Baker's cyst, however we need to make sure that it is not a blood clot in your leg, otherwise known as a DVT.  Unfortunately, we were not able to schedule an ultrasound for you now since they are closed. Please call [phone] to try and schedule this tomorrow.  I have put an order in for this.  It will expire next week.  Take 600 mg of ibuprofen with a Tylenol containing product 3 or 4 times a day as needed for pain.  Either ibuprofen with 1000 mg of plain Tylenol for mild to moderate pain or ibuprofen with 1-2 Norco for severe pain.  Go immediately to the ER if your pain is not controlled with ibuprofen/Norco, chest pain, shortness of breath, or for any other concerns.  If you have a Baker's cyst, follow-up with orthopedics as needed if you are still having pain in a week.  If you have a blood clot in your leg, you will need to go immediately to the emergency department.

## 2019-12-20 NOTE — ED Triage Notes (Signed)
Pt reports bilateral knee pain and right leg pain for over a week, after she stand up for a prolonged period of time at work. Pt reports the pain started in the right knee and goes down to the right ankle and then goes up to the right buttock.

## 2019-12-20 NOTE — ED Triage Notes (Signed)
Pt has been having bilateral knee pain for over a week. Today pt reports increased pain to posterior knee with radiation up and down the leg. Pt was seen at [facility] and was provided information for scheduling an [place] tomorrow. Pt also was given a Norco that she took 40 min PTA.

## 2019-12-21 ENCOUNTER — Ambulatory Visit (HOSPITAL_BASED_OUTPATIENT_CLINIC_OR_DEPARTMENT_OTHER)
Admit: 2019-12-21 | Discharge: 2019-12-21 | Disposition: A | Discharge status: Home / Self Care | Payer: PRIVATE HEALTH INSURANCE | Attending: Emergency Medicine | Admitting: Emergency Medicine

## 2019-12-21 ENCOUNTER — Emergency Department (HOSPITAL_BASED_OUTPATIENT_CLINIC_OR_DEPARTMENT_OTHER): Payer: PRIVATE HEALTH INSURANCE

## 2019-12-21 ENCOUNTER — Ambulatory Visit: Payer: Self-pay

## 2019-12-21 ENCOUNTER — Emergency Department (HOSPITAL_BASED_OUTPATIENT_CLINIC_OR_DEPARTMENT_OTHER)
Admission: EM | Admit: 2019-12-21 | Discharge: 2019-12-21 | Disposition: A | Discharge status: Home / Self Care | Payer: PRIVATE HEALTH INSURANCE | Source: Home / Self Care | Attending: Emergency Medicine | Admitting: Emergency Medicine

## 2019-12-21 ENCOUNTER — Other Ambulatory Visit: Payer: Self-pay

## 2019-12-21 ENCOUNTER — Ambulatory Visit (HOSPITAL_COMMUNITY): Admission: RE | Admit: 2019-12-21 | Payer: PRIVATE HEALTH INSURANCE | Source: Ambulatory Visit

## 2019-12-21 ENCOUNTER — Encounter (HOSPITAL_BASED_OUTPATIENT_CLINIC_OR_DEPARTMENT_OTHER): Payer: Self-pay | Admitting: Emergency Medicine

## 2019-12-21 ENCOUNTER — Ambulatory Visit (INDEPENDENT_AMBULATORY_CARE_PROVIDER_SITE_OTHER): Payer: PRIVATE HEALTH INSURANCE | Admitting: Family Medicine

## 2019-12-21 ENCOUNTER — Encounter: Payer: Self-pay | Admitting: Family Medicine

## 2019-12-21 VITALS — BP 125/82 | HR 83 | Ht 67.0 in | Wt 284.0 lb

## 2019-12-21 DIAGNOSIS — M25561 Pain in right knee: Secondary | ICD-10-CM

## 2019-12-21 DIAGNOSIS — M25461 Effusion, right knee: Secondary | ICD-10-CM | POA: Diagnosis not present

## 2019-12-21 DIAGNOSIS — Z87891 Personal history of nicotine dependence: Secondary | ICD-10-CM | POA: Insufficient documentation

## 2019-12-21 MED ORDER — OXYCODONE-ACETAMINOPHEN 5-325 MG PO TABS
1.0000 | ORAL_TABLET | Freq: Once | ORAL | Status: AC
  Administered 2019-12-21: 1 via ORAL
  Filled 2019-12-21: qty 1

## 2019-12-21 MED ORDER — PREDNISONE 5 MG PO TABS: ORAL_TABLET | ORAL | 0 refills | Status: DC

## 2019-12-21 NOTE — Patient Instructions (Signed)
Nicd to meet you Please try the medicine  Please try ice   Please send me a message in MyChart with any questions or updates.  Please see me back in 2 weeks or sooner if needed.   --Dr. Jordan Likes

## 2019-12-21 NOTE — ED Provider Notes (Signed)
[facility] EMERGENCY DEPARTMENT Provider Note   CSN: [id number] Arrival date & time: 12/21/19  5038     History Chief Complaint  Patient presents with  . Knee Pain    Jasmine Hudson is a 29 y.o. female.  HPI     This is a 29 year old female who presents with right knee pain.  Patient was seen and evaluated by myself approximately 3 hours ago.  Patient reports pain improved but then worsened acutely at home.  She did not take any of her Norco for breakthrough pain.  She is reporting 10 out of 10 pain.  Worse with ambulation and movement.  It is throbbing and continues to radiate into the calf.  Again no trauma, no fevers, no redness to suggest infection.  Past Medical History:  Diagnosis Date  . Chronic kidney disease   . Herpes   . Pyelonephritis affecting pregnancy 2013  . Trichomoniasis 09/18/2013   09/18/13 Phone number disconnected > requested letter to be sent or inform at next visit     Patient Active Problem List   Diagnosis Date Noted  . Status post repeat low transverse cesarean section 02/08/2018  . Group B Streptococcus carrier, +RV culture, currently pregnant 01/19/2018  . Recurrent UTI 11/11/2017  . Rubella non-immune status, antepartum 11/11/2017  . Pyelonephritis affecting pregnancy in first trimester 06/29/2017  . Supervision of normal intrauterine pregnancy in multigravida in first trimester 06/29/2017  . Previous cesarean section 09/10/2013  . Herpes 11/22/2011    Past Surgical History:  Procedure Laterality Date  . CESAREAN SECTION  2007  . CESAREAN SECTION  02/29/2012   Procedure: CESAREAN SECTION;  Surgeon: Tilda Burrow, MD;  Location: [facility];  Service: Gynecology;  Laterality: N/A;  Repeat   . CESAREAN SECTION N/A 10/23/2013   Procedure: CESAREAN SECTION;  Surgeon: Reva Bores, MD;  Location: [facility];  Service: Obstetrics;  Laterality: N/A;  Small abrasion located on the left lower lateral of the abdomen.  Dermabond applied to the  abrasion by Dr. Reola Calkins.  See OR note.  Marland Kitchen CESAREAN SECTION WITH BILATERAL TUBAL LIGATION Bilateral 02/08/2018   Procedure: REPEAT CESAREAN SECTION WITH BILATERAL TUBAL LIGATION;  Surgeon: Reva Bores, MD;  Location: [facility]THING SU[facility];  Service: Obstetrics;  Laterality: Bilateral;  . INDUCED ABORTION       OB History    Gravida  5   Para  4   Term  4   Preterm  0   AB  1   Living  4     SAB  0   TAB  1   Ectopic      Multiple  0   Live Births  4           Family History  Problem Relation Age of Onset  . Hypertension Mother   . Hypertension Maternal Grandfather     Social History   Tobacco Use  . Smoking status: Former Smoker    Types: Cigars    Quit date: 08/13/2010    Years since quitting: 9.3  . Smokeless tobacco: Never Used  Substance Use Topics  . Alcohol use: Yes    Comment: ocassionally  . Drug use: No    Home Medications Prior to Admission medications   Medication Sig Start Date End Date Taking? Authorizing Provider  HYDROcodone-acetaminophen (NORCO/VICODIN) 5-325 MG tablet Take 1-2 tablets by mouth every 6 (six) hours as needed for moderate pain or severe pain. 12/20/19   Domenick Gong, MD  ibuprofen (ADVIL) 600 MG tablet Take 1 tablet (600 mg total) by mouth every 6 (six) hours as needed. 12/20/19   Domenick Gong, MD    Allergies    Apple and Banana  Review of Systems   Review of Systems  Constitutional: Negative for fever.  Musculoskeletal:       Knee pain  Neurological: Negative for weakness and numbness.  All other systems reviewed and are negative.   Physical Exam Updated Vital Signs BP (!) 132/58 (BP Location: Right Arm)   Pulse 96   Temp 98.6 F (37 C) (Oral)   Resp 20   Ht 1.702 m (5\' 7" )   Wt 128.8 kg   LMP 12/02/2019   SpO2 100%   BMI 44.48 kg/m   Physical Exam Vitals and nursing note reviewed.  Constitutional:      General: She is not in acute distress.    Appearance: She is well-developed. She is obese.   HENT:     Head: Normocephalic and atraumatic.     Nose: Nose normal.     Mouth/Throat:     Mouth: Mucous membranes are moist.  Eyes:     Pupils: Pupils are equal, round, and reactive to light.  Cardiovascular:     Rate and Rhythm: Normal rate and regular rhythm.  Pulmonary:     Effort: Pulmonary effort is normal. No respiratory distress.  Abdominal:     Palpations: Abdomen is soft.     Tenderness: There is no abdominal tenderness.  Musculoskeletal:     Cervical back: Neck supple.     Comments: Exam limited by body habitus, normal range of motion of the knee, tenderness palpation over the popliteal fossa, no obvious swelling, no joint line tenderness or laxity, 2+ DP pulse  Skin:    General: Skin is warm and dry.  Neurological:     Mental Status: She is alert and oriented to person, place, and time.  Psychiatric:     Comments: Anxious appearing     ED Results / Procedures / Treatments   Labs (all labs ordered are listed, but only abnormal results are displayed) Labs Reviewed - No data to display  EKG None  Radiology DG Knee Complete 4 Views Right  Result Date: 12/21/2019 CLINICAL DATA:  29 year old female with right knee pain. EXAM: RIGHT KNEE - COMPLETE 4+ VIEW COMPARISON:  None. FINDINGS: There is no acute fracture or dislocation. The bones are well mineralized. No arthritic changes. There is a small suprapatellar effusion. The soft tissues are unremarkable. IMPRESSION: 1. No acute fracture or dislocation. 2. Small suprapatellar effusion. Electronically Signed   By: 26 M.D.   On: 12/21/2019 03:10    Procedures Procedures (including critical care time)  Medications Ordered in ED Medications  oxyCODONE-acetaminophen (PERCOCET/ROXICET) 5-325 MG per tablet 1 tablet (1 tablet Oral Given 12/21/19 0250)    ED Course  I have reviewed the triage vital signs and the nursing notes.  Pertinent labs & imaging results that were available during my care of the  patient were reviewed by me and considered in my medical decision making (see chart for details).    MDM Rules/Calculators/A&P                       Patient again presents with right knee pain.  She did not take anything for breakthrough pain at home.  Again her exam is highly suspicious of a ruptured Baker's cyst.  I discussed with her that this can  be acutely painful but it is very important that she keep her leg iced and elevated.  She also needs significant anti-inflammatories.  I did obtain x-rays this time and they show no evidence of fracture.  She has a prepatellar effusion that is very small.  No overlying skin changes or infectious symptoms suggestive of septic joint.  No known history of osteoarthritis or gout.  Her weight certainly puts her at risk for osteoarthritis and likely exacerbates her pain with weightbearing.  I discussed with her that given the significance of her pain, it is very important for her to maintain anti-inflammatories and take medications for breakthrough pain.  Joint aspiration and injection of steroid would also be a possibility; however, this is not routinely done in the ER with the exception of ruling out a septic joint which I think is highly unlikely.  I will refer her to sports medicine and encouraged her to return for ultrasound as previously scheduled to rule out DVT or other cause.  After history, exam, and medical workup I feel the patient has been appropriately medically screened and is safe for discharge home. Pertinent diagnoses were discussed with the patient. Patient was given return precautions.   Final Clinical Impression(s) / ED Diagnoses Final diagnoses:  Acute pain of right knee    Rx / DC Orders ED Discharge Orders    None       Annistyn Depass, Barbette Hair, MD 12/21/19 [phone]

## 2019-12-21 NOTE — Progress Notes (Signed)
Jasmine Hudson - 29 y.o. female MRN [id number]  Date of birth: July 05, 1991  SUBJECTIVE:  Including CC & ROS.  Chief Complaint  Patient presents with  . Knee Pain    right x 2 weeks    Jasmine Hudson is a 29 y.o. female that is presenting with right leg pain and right knee pain.  The pain is gotten severe to where she cannot walk.  It seems to be occurring over the lateral aspect of the leg as well as the knee.  She denies any specific inciting event or trauma.  Has not been doing any new or different exercises.  Pain is worse with certain movements and at night.  No history of surgery..  Independent review of the right knee x-ray from 5/10 shows mild medial joint space narrowing.  Venous duplex from today shows no DVT.   Review of Systems See HPI   HISTORY: Past Medical, Surgical, Social, and Family History Reviewed & Updated per EMR.   Pertinent Historical Findings include:  Past Medical History:  Diagnosis Date  . Chronic kidney disease   . Herpes   . Pyelonephritis affecting pregnancy 2013  . Trichomoniasis 09/18/2013   09/18/13 Phone number disconnected > requested letter to be sent or inform at next visit     Past Surgical History:  Procedure Laterality Date  . CESAREAN SECTION  2007  . CESAREAN SECTION  02/29/2012   Procedure: CESAREAN SECTION;  Surgeon: Jonnie Kind, MD;  Location: [facility];  Service: Gynecology;  Laterality: N/A;  Repeat   . CESAREAN SECTION N/A 10/23/2013   Procedure: CESAREAN SECTION;  Surgeon: Donnamae Jude, MD;  Location: [facility];  Service: Obstetrics;  Laterality: N/A;  Small abrasion located on the left lower lateral of the abdomen.  Dermabond applied to the abrasion by Dr. Olevia Bowens.  See OR note.  Marland Kitchen CESAREAN SECTION WITH BILATERAL TUBAL LIGATION Bilateral 02/08/2018   Procedure: REPEAT CESAREAN SECTION WITH BILATERAL TUBAL LIGATION;  Surgeon: Donnamae Jude, MD;  Location: [place];  Service: Obstetrics;  Laterality: Bilateral;  . INDUCED ABORTION       Family History  Problem Relation Age of Onset  . Hypertension Mother   . Hypertension Maternal Grandfather     Social History   Socioeconomic History  . Marital status: Single    Spouse name: Not on file  . Number of children: Not on file  . Years of education: Not on file  . Highest education level: Not on file  Occupational History  . Not on file  Tobacco Use  . Smoking status: Former Smoker    Types: Cigars    Quit date: 08/13/2010    Years since quitting: 9.3  . Smokeless tobacco: Never Used  Substance and Sexual Activity  . Alcohol use: Yes    Comment: ocassionally  . Drug use: No  . Sexual activity: Yes    Birth control/protection: None    Comment: Patient want birth control pills  Other Topics Concern  . Not on file  Social History Narrative  . Not on file   Social Determinants of Health   Financial Resource Strain:   . Difficulty of Paying Living Expenses:   Food Insecurity:   . Worried About Charity fundraiser in the Last Year:   . Arboriculturist in the Last Year:   Transportation Needs:   . Film/video editor (Medical):   Marland Kitchen Lack of Transportation (Non-Medical):   Physical Activity:   .  Days of Exercise per Week:   . Minutes of Exercise per Session:   Stress:   . Feeling of Stress :   Social Connections:   . Frequency of Communication with Friends and Family:   . Frequency of Social Gatherings with Friends and Family:   . Attends Religious Services:   . Active Member of Clubs or Organizations:   . Attends Banker Meetings:   Marland Kitchen Marital Status:   Intimate Partner Violence:   . Fear of Current or Ex-Partner:   . Emotionally Abused:   Marland Kitchen Physically Abused:   . Sexually Abused:      PHYSICAL EXAM:  VS: BP 125/82   Pulse 83   Ht 5\' 7"  (1.702 m)   Wt 284 lb (128.8 kg)   LMP 12/02/2019   BMI 44.48 kg/m  Physical Exam Gen: NAD, alert, cooperative with exam, well-appearing MSK:  Right knee: Passive range of motion. No  tenderness palpation of the quadricep and patellar tendon. Mild effusion. No instability with valgus or varus stress testing. Right hip: Normal internal and external rotation of the hip. Normal strength with hip flexion. Negative straight leg raise. Neurovascularly intact  Limited ultrasound: Right knee:  Moderate effusion of the suprapatellar pouch.  Left knee also with an effusion. Normal-appearing quadricep and patellar tendon. Mild medial joint space narrowing. Normal-appearing lateral joint space.  Summary: Effusions noted in each knee  Ultrasound and interpretation by 12/04/2019, MD    ASSESSMENT & PLAN:   Knee effusion, right Unclear if the pain is more associated with the knee or radicular in nature.  She has an effusion but also occurring in the left knee.  Possible for more systemic in nature. -Counseled on home exercise therapy and supportive care. -Prednisone. -Could consider aspiration and analysis of fluid and injection. -May need to consider imaging of the lumbar spine. -Could consider inflammatory labs.

## 2019-12-21 NOTE — ED Provider Notes (Signed)
[facility] EMERGENCY DEPARTMENT Provider Note   CSN: [id number] Arrival date & time: 12/20/19  2116     History Chief Complaint  Patient presents with  . Knee Pain    Jasmine Hudson is a 29 y.o. female.  HPI     This is a 29 year old female who presents with knee pain.  Patient reports over the last 2 weeks she has had progressive right greater than left knee pain.  She has significantly worsening right knee pain today.  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  Denies significant injury or specific injury.  She was seen in urgent care and prescribed Norco and ibuprofen.  She has not taken any ibuprofen but took a Norco prior to arrival without significant relief.  She rates her pain at 10 out of 10.  It is worse with ambulation and range of motion.  She reports that the pain radiates sometimes upwards and sometimes down into the calf.  She denies any recent travel, recent surgeries, history of blood clots.  She was told that she may need an ultrasound.  She denies any chest pain or shortness of breath.  Past Medical History:  Diagnosis Date  . Chronic kidney disease   . Herpes   . Pyelonephritis affecting pregnancy 2013  . Trichomoniasis 09/18/2013   09/18/13 Phone number disconnected > requested letter to be sent or inform at next visit     Patient Active Problem List   Diagnosis Date Noted  . Status post repeat low transverse cesarean section 02/08/2018  . Group B Streptococcus carrier, +RV culture, currently pregnant 01/19/2018  . Recurrent UTI 11/11/2017  . Rubella non-immune status, antepartum 11/11/2017  . Pyelonephritis affecting pregnancy in first trimester 06/29/2017  . Supervision of normal intrauterine pregnancy in multigravida in first trimester 06/29/2017  . Previous cesarean section 09/10/2013  . Herpes 11/22/2011    Past Surgical History:   Procedure Laterality Date  . CESAREAN SECTION  2007  . CESAREAN SECTION  02/29/2012   Procedure: CESAREAN SECTION;  Surgeon: Tilda Burrow, MD;  Location: [facility];  Service: Gynecology;  Laterality: N/A;  Repeat   . CESAREAN SECTION N/A 10/23/2013   Procedure: CESAREAN SECTION;  Surgeon: Reva Bores, MD;  Location: [facility];  Service: Obstetrics;  Laterality: N/A;  Small abrasion located on the left lower lateral of the abdomen.  Dermabond applied to the abrasion by Dr. Reola Calkins.  See OR note.  Marland Kitchen CESAREAN SECTION WITH BILATERAL TUBAL LIGATION Bilateral 02/08/2018   Procedure: REPEAT CESAREAN SECTION WITH BILATERAL TUBAL LIGATION;  Surgeon: Reva Bores, MD;  Location: [facility];  Service: Obstetrics;  Laterality: Bilateral;  . INDUCED ABORTION       OB History    Gravida  5   Para  4   Term  4   Preterm  0   AB  1   Living  4     SAB  0   TAB  1   Ectopic      Multiple  0   Live Births  4           Family History  Problem Relation Age of Onset  . Hypertension Mother   . Hypertension Maternal Grandfather     Social History   Tobacco Use  . Smoking status: Former Smoker    Types:  Cigars    Quit date: 08/13/2010    Years since quitting: 9.3  . Smokeless tobacco: Never Used  Substance Use Topics  . Alcohol use: Yes    Comment: ocassionally  . Drug use: No    Home Medications Prior to Admission medications   Medication Sig Start Date End Date Taking? Authorizing Provider  HYDROcodone-acetaminophen (NORCO/VICODIN) 5-325 MG tablet Take 1-2 tablets by mouth every 6 (six) hours as needed for moderate pain or severe pain. 12/20/19   Domenick Gong, MD  ibuprofen (ADVIL) 600 MG tablet Take 1 tablet (600 mg total) by mouth every 6 (six) hours as needed. 12/20/19   Domenick Gong, MD    Allergies    Apple and Banana  Review of Systems   Review of Systems  Constitutional: Negative for fever.  Respiratory: Negative for shortness of breath.    Cardiovascular: Negative for chest pain.  Musculoskeletal:       Knee pain  All other systems reviewed and are negative.   Physical Exam Updated Vital Signs BP (!) 136/96   Pulse 99   Temp 98.3 F (36.8 C) (Oral)   Resp 20   Ht 1.702 m (5\' 7" )   Wt 127 kg   LMP 12/02/2019   SpO2 98%   BMI 43.85 kg/m   Physical Exam Vitals and nursing note reviewed.  Constitutional:      Appearance: She is well-developed. She is obese. She is not ill-appearing.  HENT:     Head: Normocephalic and atraumatic.     Nose: Nose normal.     Mouth/Throat:     Mouth: Mucous membranes are moist.  Cardiovascular:     Rate and Rhythm: Normal rate and regular rhythm.  Pulmonary:     Effort: Pulmonary effort is normal. No respiratory distress.  Musculoskeletal:     Cervical back: Neck supple.     Comments: Exam somewhat limited by body habitus.  Normal range of motion the right knee, no overlying erythema, no joint line tenderness or laxity noted, there is tenderness palpation over the posterior aspect of the knee in the popliteal fossa, no fullness or redness noted, 2+ DP pulse  Skin:    General: Skin is warm and dry.  Neurological:     Mental Status: She is alert and oriented to person, place, and time.  Psychiatric:        Mood and Affect: Mood normal.     ED Results / Procedures / Treatments   Labs (all labs ordered are listed, but only abnormal results are displayed) Labs Reviewed - No data to display  EKG None  Radiology No results found.  Procedures Procedures (including critical care time)  Medications Ordered in ED Medications  ketorolac (TORADOL) 30 MG/ML injection 30 mg (30 mg Intramuscular Given 12/20/19 2346)    ED Course  I have reviewed the triage vital signs and the nursing notes.  Pertinent labs & imaging results that were available during my care of the patient were reviewed by me and considered in my medical decision making (see chart for details).    MDM  Rules/Calculators/A&P                       Patient presents with left knee pain.  Acute on chronic.  Reports worsening pain over the last 2 weeks but acutely worsening over the last 24 hours.  No redness or erythema to suggest infection.  No trauma history.  Doubt fracture or dislocation given exam.  Do not feel x-ray imaging would be helpful at this time as I doubt fracture.  Given location of pain, Baker's cyst is a consideration.  DVT is also consideration.  No known history of DVTs.  Discussed with patient that I agree that she should be set up with ultrasound tomorrow to rule out DVT but I highly suspect that this is a Baker's cyst.  Would recommend that she start the ibuprofen every 6 hours for anti-inflammatory effect and use the Norco only for breakthrough pain.  Anti-inflammatory would also help with any osteoarthritis that may be present.  After history, exam, and medical workup I feel the patient has been appropriately medically screened and is safe for discharge home. Pertinent diagnoses were discussed with the patient. Patient was given return precautions.   Final Clinical Impression(s) / ED Diagnoses Final diagnoses:  Posterior right knee pain    Rx / DC Orders ED Discharge Orders         Ordered    US Venous Img Lower Unilateral Right     12/21/19 0001           Xeng Kucher, Barbette Hair, MD 12/21/19 [phone]

## 2019-12-21 NOTE — Discharge Instructions (Addendum)
You were seen today for knee pain.  Return tomorrow for ultrasound imaging to rule out Baker's cyst and DVT.  Make sure that you are taking ibuprofen every 6 hours for anti-inflammatory effect.

## 2019-12-21 NOTE — ED Triage Notes (Addendum)
Pt reports right knee pain radiating up and down leg, with no relief. Discharged from [facility] about an hour ago, returned because of pain. Unable to bear weight

## 2019-12-21 NOTE — Discharge Instructions (Addendum)
You were seen today for recurrent knee pain.  Make sure that you are taking scheduled ibuprofen.  You may also take hydrocodone as needed for breakthrough pain.  Keep iced and elevated.  Follow-up for ultrasound imaging and you will be given sports medicine follow-up.

## 2019-12-22 DIAGNOSIS — M25461 Effusion, right knee: Secondary | ICD-10-CM | POA: Insufficient documentation

## 2019-12-22 NOTE — Assessment & Plan Note (Signed)
Unclear if the pain is more associated with the knee or radicular in nature.  She has an effusion but also occurring in the left knee.  Possible for more systemic in nature. -Counseled on home exercise therapy and supportive care. -Prednisone. -Could consider aspiration and analysis of fluid and injection. -May need to consider imaging of the lumbar spine. -Could consider inflammatory labs.

## 2019-12-24 ENCOUNTER — Other Ambulatory Visit: Payer: Self-pay

## 2019-12-24 ENCOUNTER — Ambulatory Visit (INDEPENDENT_AMBULATORY_CARE_PROVIDER_SITE_OTHER): Payer: PRIVATE HEALTH INSURANCE | Admitting: Family Medicine

## 2019-12-24 DIAGNOSIS — M5416 Radiculopathy, lumbar region: Secondary | ICD-10-CM

## 2019-12-24 DIAGNOSIS — M25461 Effusion, right knee: Secondary | ICD-10-CM

## 2019-12-24 MED ORDER — HYDROCODONE-ACETAMINOPHEN 5-325 MG PO TABS: 1.0000 | ORAL_TABLET | Freq: Three times a day (TID) | ORAL | 0 refills | Status: DC | PRN

## 2019-12-24 NOTE — Patient Instructions (Signed)
Good to see you Please start the ibuprofen once the prednisone has finished  Please use the norco for severe pain  Please try the exercises   Please send me a message in MyChart with any questions or updates.  Please see me back in 2-3 weeks.   --Dr. Jordan Likes

## 2019-12-24 NOTE — Assessment & Plan Note (Signed)
Pain that she is experiencing around the knee as well as laterally on the leg may be more related to radiculopathy as opposed to the knee.  Has had some improvement with the prednisone.  Pain is worse with standing ambulation. -Counseled on home exercise therapy and supportive care. -Can finish prednisone. -Would start ibuprofen once prednisone is finished. -Norco for severe pain. -Could consider MRI if pain is still severe in order for epidurals.

## 2019-12-24 NOTE — Assessment & Plan Note (Signed)
Pain seems more radicular as opposed to being just solely associated with the knee.  Could still consider any injection and aspiration if pain is ongoing. -Counseled on supportive care. -Could consider aspiration injection.

## 2019-12-24 NOTE — Progress Notes (Signed)
Jasmine Hudson - 29 y.o. female MRN [id number]  Date of birth: [birth year]  SUBJECTIVE:  Including CC & ROS.  No chief complaint on file.   Jasmine Hudson is a 29 y.o. female that is following up for her right leg pain and right knee pain.  Pain is improved somewhat.  She still has severe pain intermittently.  She has been using the pain medication to help with the pain that the prednisone does not dampen down.  Denies any numbness or tingling.  Still has pain with standing and ambulation.   Review of Systems See HPI   HISTORY: Past Medical, Surgical, Social, and Family History Reviewed & Updated per EMR.   Pertinent Historical Findings include:  Past Medical History:  Diagnosis Date  . Chronic kidney disease   . Herpes   . Pyelonephritis affecting pregnancy 2013  . Trichomoniasis 09/18/2013   09/18/13 Phone number disconnected > requested letter to be sent or inform at next visit     Past Surgical History:  Procedure Laterality Date  . CESAREAN SECTION  2007  . CESAREAN SECTION  02/29/2012   Procedure: CESAREAN SECTION;  Surgeon: Jonnie Kind, MD;  Location: [place] [facility];  Service: Gynecology;  Laterality: N/A;  Repeat   . CESAREAN SECTION N/A 10/23/2013   Procedure: CESAREAN SECTION;  Surgeon: Donnamae Jude, MD;  Location: [place] [facility];  Service: Obstetrics;  Laterality: N/A;  Small abrasion located on the left lower lateral of the abdomen.  Dermabond applied to the abrasion by Dr. Olevia Bowens.  See OR note.  Marland Kitchen CESAREAN SECTION WITH BILATERAL TUBAL LIGATION Bilateral 02/08/2018   Procedure: REPEAT CESAREAN SECTION WITH BILATERAL TUBAL LIGATION;  Surgeon: Donnamae Jude, MD;  Location: [place];  Service: Obstetrics;  Laterality: Bilateral;  . INDUCED ABORTION      Family History  Problem Relation Age of Onset  . Hypertension Mother   . Hypertension Maternal Grandfather     Social History   Socioeconomic History  . Marital status: Single    Spouse name: Not on file  . Number of  children: Not on file  . Years of education: Not on file  . Highest education level: Not on file  Occupational History  . Not on file  Tobacco Use  . Smoking status: Former Smoker    Types: Cigars    Quit date: 08/13/2010    Years since quitting: 9.3  . Smokeless tobacco: Never Used  Substance and Sexual Activity  . Alcohol use: Yes    Comment: ocassionally  . Drug use: No  . Sexual activity: Yes    Birth control/protection: None    Comment: Patient want birth control pills  Other Topics Concern  . Not on file  Social History Narrative  . Not on file   Social Determinants of Health   Financial Resource Strain:   . Difficulty of Paying Living Expenses:   Food Insecurity:   . Worried About Charity fundraiser in the Last Year:   . Arboriculturist in the Last Year:   Transportation Needs:   . Film/video editor (Medical):   Marland Kitchen Lack of Transportation (Non-Medical):   Physical Activity:   . Days of Exercise per Week:   . Minutes of Exercise per Session:   Stress:   . Feeling of Stress :   Social Connections:   . Frequency of Communication with Friends and Family:   . Frequency of Social Gatherings with Friends and Family:   .  Attends Religious Services:   . Active Member of Clubs or Organizations:   . Attends Banker Meetings:   Marland Kitchen Marital Status:   Intimate Partner Violence:   . Fear of Current or Ex-Partner:   . Emotionally Abused:   Marland Kitchen Physically Abused:   . Sexually Abused:      PHYSICAL EXAM:  VS: BP 114/80   Ht 5\' 7"  (1.702 m)   Wt 284 lb (128.8 kg)   LMP 12/02/2019   BMI 44.48 kg/m  Physical Exam Gen: NAD, alert, cooperative with exam, well-appearing MSK:  Right leg: No signs of atrophy. No obvious effusion of the knee. Normal knee flexion extension. Normal strength resistance. Neurovascularly intact     ASSESSMENT & PLAN:   Lumbar radiculopathy Pain that she is experiencing around the knee as well as laterally on the leg may  be more related to radiculopathy as opposed to the knee.  Has had some improvement with the prednisone.  Pain is worse with standing ambulation. -Counseled on home exercise therapy and supportive care. -Can finish prednisone. -Would start ibuprofen once prednisone is finished. -Norco for severe pain. -Could consider MRI if pain is still severe in order for epidurals.  Knee effusion, right Pain seems more radicular as opposed to being just solely associated with the knee.  Could still consider any injection and aspiration if pain is ongoing. -Counseled on supportive care. -Could consider aspiration injection.

## 2019-12-31 ENCOUNTER — Ambulatory Visit (INDEPENDENT_AMBULATORY_CARE_PROVIDER_SITE_OTHER): Payer: PRIVATE HEALTH INSURANCE | Admitting: Family Medicine

## 2019-12-31 DIAGNOSIS — Z5329 Procedure and treatment not carried out because of patient's decision for other reasons: Secondary | ICD-10-CM

## 2019-12-31 NOTE — Progress Notes (Signed)
NO SHOW

## 2020-08-16 ENCOUNTER — Emergency Department (HOSPITAL_BASED_OUTPATIENT_CLINIC_OR_DEPARTMENT_OTHER): Payer: Self-pay

## 2020-08-16 ENCOUNTER — Other Ambulatory Visit: Payer: Self-pay

## 2020-08-16 ENCOUNTER — Emergency Department (HOSPITAL_COMMUNITY): Admission: EM | Admit: 2020-08-16 | Payer: PRIVATE HEALTH INSURANCE | Source: Home / Self Care

## 2020-08-16 ENCOUNTER — Emergency Department (HOSPITAL_BASED_OUTPATIENT_CLINIC_OR_DEPARTMENT_OTHER)
Admission: EM | Admit: 2020-08-16 | Discharge: 2020-08-17 | Disposition: A | Discharge status: Home / Self Care | Payer: Self-pay | Attending: Emergency Medicine | Admitting: Emergency Medicine

## 2020-08-16 ENCOUNTER — Encounter (HOSPITAL_BASED_OUTPATIENT_CLINIC_OR_DEPARTMENT_OTHER): Payer: Self-pay

## 2020-08-16 DIAGNOSIS — N189 Chronic kidney disease, unspecified: Secondary | ICD-10-CM | POA: Insufficient documentation

## 2020-08-16 DIAGNOSIS — S91114A Laceration without foreign body of right lesser toe(s) without damage to nail, initial encounter: Secondary | ICD-10-CM | POA: Insufficient documentation

## 2020-08-16 DIAGNOSIS — W25XXXA Contact with sharp glass, initial encounter: Secondary | ICD-10-CM | POA: Insufficient documentation

## 2020-08-16 DIAGNOSIS — Z87891 Personal history of nicotine dependence: Secondary | ICD-10-CM | POA: Insufficient documentation

## 2020-08-16 NOTE — ED Triage Notes (Signed)
Pt states she stepped on broken glass yesterday-cut to right 4th toe-bandaid in place with no bleed through-NAD-steady gait

## 2020-08-16 NOTE — ED Provider Notes (Signed)
[facility]EMERG[facility]Y DEPT [facility] Emergency Department Provider Note MRN:  [id number]  Arrival date & time: 08/17/20     Chief Complaint   Foot Injury   History of Present Illness   Jasmine Hudson is a 30 y.o. year-old female with no pertinent past medical presenting to the ED with chief complaint of foot injury.  Patient cut her toe on some glass today at 2 PM, has had continued pain and bleeding from the site.  Denies falling, no head trauma, no other injuries or complaints.  Up-to-date on tetanus.  Pain is currently mild to moderate, worse with motion or palpation.  Review of Systems  A problem-focused ROS was performed. Positive for toe laceration.  Patient denies head trauma.  Patient's Health History    Past Medical History:  Diagnosis Date  . Chronic kidney disease   . Herpes   . Pyelonephritis affecting pregnancy 2013  . Trichomoniasis 09/18/2013   09/18/13 Phone number disconnected > requested letter to be sent or inform at next visit     Past Surgical History:  Procedure Laterality Date  . CESAREAN SECTION  2007  . CESAREAN SECTION  02/29/2012   Procedure: CESAREAN SECTION;  Surgeon: Tilda Burrow, MD;  Location: [facility];  Service: Gynecology;  Laterality: N/A;  Repeat   . CESAREAN SECTION N/A 10/23/2013   Procedure: CESAREAN SECTION;  Surgeon: Reva Bores, MD;  Location: [facility];  Service: Obstetrics;  Laterality: N/A;  Small abrasion located on the left lower lateral of the abdomen.  Dermabond applied to the abrasion by Dr. Reola Calkins.  See OR note.  Marland Kitchen CESAREAN SECTION WITH BILATERAL TUBAL LIGATION Bilateral 02/08/2018   Procedure: REPEAT CESAREAN SECTION WITH BILATERAL TUBAL LIGATION;  Surgeon: Reva Bores, MD;  Location: [facility];  Service: Obstetrics;  Laterality: Bilateral;  . INDUCED ABORTION      Family History  Problem Relation Age of Onset  . Hypertension Mother   . Hypertension Maternal Grandfather     Social History   Socioeconomic  History  . Marital status: Single    Spouse name: Not on file  . Number of children: Not on file  . Years of education: Not on file  . Highest education level: Not on file  Occupational History  . Not on file  Tobacco Use  . Smoking status: Former Smoker    Types: Cigars    Quit date: 08/13/2010    Years since quitting: 10.0  . Smokeless tobacco: Never Used  Vaping Use  . Vaping Use: Never used  Substance and Sexual Activity  . Alcohol use: Yes    Comment: ocassionally  . Drug use: No  . Sexual activity: Not on file  Other Topics Concern  . Not on file  Social History Narrative  . Not on file   Social Determinants of Health   Financial Resource Strain: Not on file  Food Insecurity: Not on file  Transportation Needs: Not on file  Physical Activity: Not on file  Stress: Not on file  Social Connections: Not on file  Intimate Partner Violence: Not on file     Physical Exam   Vitals:   08/16/20 1915 08/16/20 2346  BP: 132/73 120/75  Pulse: 93 86  Resp: 18 14  Temp: 98.7 F (37.1 C) 98.1 F (36.7 C)  SpO2: 100% 100%    CONSTITUTIONAL: Well-appearing, NAD NEURO:  Alert and oriented x 3, no focal deficits EYES:  eyes equal and reactive ENT/NECK:  no LAD,  no JVD CARDIO: Regular rate, well-perfused, normal S1 and S2 PULM:  CTAB no wheezing or rhonchi GI/GU:  normal bowel sounds, non-distended, non-tender MSK/SPINE:  No gross deformities, no edema SKIN: Laceration to medial aspect of right fourth toe, hemostatic, neurovascularly intact distally PSYCH:  Appropriate speech and behavior  *Additional and/or pertinent findings included in MDM below  Diagnostic and Interventional Summary    EKG Interpretation  Date/Time:    Ventricular Rate:    PR Interval:    QRS Duration:   QT Interval:    QTC Calculation:   R Axis:     Text Interpretation:        Labs Reviewed - No data to display  DG Foot Complete Right  Final Result      Medications - No data to  display   Procedures  /  Critical Care .Marland KitchenLaceration Repair  Date/Time: 08/17/2020 12:10 AM Performed by: Maudie Flakes, MD Authorized by: Maudie Flakes, MD   Consent:    Consent obtained:  Verbal   Consent given by:  Patient   Risks, benefits, and alternatives were discussed: yes     Risks discussed:  Infection, need for additional repair, nerve damage, poor wound healing, poor cosmetic result, pain and retained foreign body Universal protocol:    Procedure explained and questions answered to patient or proxy's satisfaction: yes     Imaging studies available: yes     Patient identity confirmed:  Verbally with patient Anesthesia:    Anesthesia method:  None Laceration details:    Location:  Toe   Toe location:  R fourth toe   Length (cm):  2   Depth (mm):  1 Exploration:    Hemostasis achieved with:  Direct pressure   Imaging obtained: x-ray     Imaging outcome: foreign body not noted     Wound exploration: wound explored through full range of motion and entire depth of wound visualized     Contaminated: no   Treatment:    Area cleansed with:  Soap and water   Amount of cleaning:  Standard   Debridement:  None   Undermining:  None Skin repair:    Repair method:  Tissue adhesive Approximation:    Approximation:  Close Repair type:    Repair type:  Simple Post-procedure details:    Dressing:  Open (no dressing)   Procedure completion:  Tolerated well, no immediate complications    ED Course and Medical Decision Making  I have reviewed the triage vital signs, the nursing notes, and pertinent available records from the EMR.  Listed above are laboratory and imaging tests that I personally ordered, reviewed, and interpreted and then considered in my medical decision making (see below for details).  X-ray to exclude foreign body or fracture, will seal with Dermabond.    X-ray normal, sealed as described above, appropriate for discharge.   Barth Kirks. Sedonia Small, [place] [email or web address]  Final Clinical Impressions(s) / ED Diagnoses     ICD-10-CM   1. Laceration of lesser toe of right foot without foreign body present or damage to nail, initial encounter  S91.114A     ED Discharge Orders    None       Discharge Instructions Discussed with and Provided to Patient:     Discharge Instructions     You were evaluated in the Emergency Department and after careful evaluation, we did not find any emergent condition requiring admission or further testing  in the hospital.  Your exam/testing today was overall reassuring.  We repaired your toe laceration with medical adhesive.  Please return to the Emergency Department if you experience any worsening of your condition.  Thank you for allowing [place] to be a part of your care.        Sabas Sous, MD 08/17/20 [phone]

## 2020-08-17 NOTE — Discharge Instructions (Addendum)
You were evaluated in the Emergency Department and after careful evaluation, we did not find any emergent condition requiring admission or further testing in the hospital.  Your exam/testing today was overall reassuring.  We repaired your toe laceration with medical adhesive.  Please return to the Emergency Department if you experience any worsening of your condition.  Thank you for allowing [place] to be a part of your care.

## 2020-12-25 ENCOUNTER — Emergency Department (HOSPITAL_COMMUNITY): Payer: Self-pay

## 2020-12-25 ENCOUNTER — Emergency Department (HOSPITAL_COMMUNITY)
Admission: EM | Admit: 2020-12-25 | Discharge: 2020-12-25 | Disposition: A | Discharge status: Home / Self Care | Payer: Self-pay | Attending: Emergency Medicine | Admitting: Emergency Medicine

## 2020-12-25 DIAGNOSIS — Z87891 Personal history of nicotine dependence: Secondary | ICD-10-CM | POA: Insufficient documentation

## 2020-12-25 DIAGNOSIS — N189 Chronic kidney disease, unspecified: Secondary | ICD-10-CM | POA: Insufficient documentation

## 2020-12-25 DIAGNOSIS — U071 COVID-19: Secondary | ICD-10-CM | POA: Insufficient documentation

## 2020-12-25 LAB — TROPONIN I (HIGH SENSITIVITY)
Troponin I (High Sensitivity): 2 ng/L (ref <18)
Troponin I (High Sensitivity): <2 ng/L (ref <18)

## 2020-12-25 LAB — BASIC METABOLIC PANEL
Anion gap: 9 (ref 5–15)
BUN: 6 mg/dL (ref 6–20)
CO2: 23 mmol/L (ref 22–32)
Calcium: 9 mg/dL (ref 8.9–10.3)
Chloride: 104 mmol/L (ref 98–111)
Creatinine, Ser: 0.72 mg/dL (ref 0.44–1.00)
GFR, Estimated: >60 mL/min (ref >60)
Glucose, Bld: 98 mg/dL (ref 70–99)
Potassium: 3.6 mmol/L (ref 3.5–5.1)
Sodium: 136 mmol/L (ref 135–145)

## 2020-12-25 LAB — CBC
HCT: 37.4 % (ref 36.0–46.0)
Hemoglobin: 12.2 g/dL (ref 12.0–15.0)
MCH: 28.5 pg (ref 26.0–34.0)
MCHC: 32.6 g/dL (ref 30.0–36.0)
MCV: 87.4 fL (ref 80.0–100.0)
Platelets: 342 10*3/uL (ref 150–400)
RBC: 4.28 MIL/uL (ref 3.87–5.11)
RDW: 14.4 % (ref 11.5–15.5)
WBC: 7.6 10*3/uL (ref 4.0–10.5)
nRBC: 0 % (ref 0.0–0.2)

## 2020-12-25 LAB — I-STAT BETA HCG BLOOD, ED (MC, WL, AP ONLY): I-stat hCG, quantitative: <5 m[IU]/mL (ref <5)

## 2020-12-25 LAB — RESP PANEL BY RT-PCR (FLU A&B, COVID) ARPGX2
Influenza A by PCR: NEGATIVE
Influenza B by PCR: NEGATIVE
SARS Coronavirus 2 by RT PCR: POSITIVE — AB

## 2020-12-25 MED ORDER — ACETAMINOPHEN 500 MG PO TABS
1000.0000 mg | ORAL_TABLET | Freq: Four times a day (QID) | ORAL | Status: DC | PRN
Start: 2020-12-25 — End: 2020-12-25
  Administered 2020-12-25: 1000 mg via ORAL
  Filled 2020-12-25: qty 2

## 2020-12-25 MED ORDER — IBUPROFEN 800 MG PO TABS
800.0000 mg | ORAL_TABLET | Freq: Once | ORAL | Status: AC
  Administered 2020-12-25: 800 mg via ORAL
  Filled 2020-12-25: qty 1

## 2020-12-25 MED ORDER — NIRMATRELVIR/RITONAVIR (PAXLOVID)TABLET: 3.0000 | ORAL_TABLET | Freq: Two times a day (BID) | ORAL | 0 refills | Status: AC

## 2020-12-25 MED ORDER — DEXAMETHASONE SODIUM PHOSPHATE 10 MG/ML IJ SOLN
10.0000 mg | Freq: Once | INTRAMUSCULAR | Status: AC
  Administered 2020-12-25: 10 mg via INTRAMUSCULAR
  Filled 2020-12-25: qty 1

## 2020-12-25 NOTE — ED Provider Notes (Signed)
[facility] EMERGENCY DEPARTMENT Provider Note   CSN: [id number] Arrival date & time: 12/25/20  1010     History Chief Complaint  Patient presents with  . Chest Pain  . Fever    Jasmine Hudson is a 30 y.o. female with a pmh that is noncontributory who presents with flu like symptoms. Yesterday she had a mild cough and this morning awoke " feeling like I was hit by a truck!". She states "my body has never hurt so bad." She has high fever, myalgias, headache, and arthralgias. She has associated chest pain that is worse with cough. It is not worse with exertion and is not pleuritic. She has no hemoptysis and denies sob. She is not vaccinated.  HPI     Past Medical History:  Diagnosis Date  . Chronic kidney disease   . Herpes   . Pyelonephritis affecting pregnancy 2013  . Trichomoniasis 09/18/2013   09/18/13 Phone number disconnected > requested letter to be sent or inform at next visit     Patient Active Problem List   Diagnosis Date Noted  . Lumbar radiculopathy 12/24/2019  . Knee effusion, right 12/22/2019  . Status post repeat low transverse cesarean section 02/08/2018  . Group B Streptococcus carrier, +RV culture, currently pregnant 01/19/2018  . Recurrent UTI 11/11/2017  . Rubella non-immune status, antepartum 11/11/2017  . Pyelonephritis affecting pregnancy in first trimester 06/29/2017  . Supervision of normal intrauterine pregnancy in multigravida in first trimester 06/29/2017  . Previous cesarean section 09/10/2013  . Herpes 11/22/2011    Past Surgical History:  Procedure Laterality Date  . CESAREAN SECTION  2007  . CESAREAN SECTION  02/29/2012   Procedure: CESAREAN SECTION;  Surgeon: Tilda Burrow, MD;  Location: [facility];  Service: Gynecology;  Laterality: N/A;  Repeat   . CESAREAN SECTION N/A 10/23/2013   Procedure: CESAREAN SECTION;  Surgeon: Reva Bores, MD;  Location: [facility];  Service: Obstetrics;  Laterality: N/A;  Small abrasion located  on the left lower lateral of the abdomen.  Dermabond applied to the abrasion by Dr. Reola Calkins.  See OR note.  Marland Kitchen CESAREAN SECTION WITH BILATERAL TUBAL LIGATION Bilateral 02/08/2018   Procedure: REPEAT CESAREAN SECTION WITH BILATERAL TUBAL LIGATION;  Surgeon: Reva Bores, MD;  Location: [facility] BIRTHING SUITES;  Service: Obstetrics;  Laterality: Bilateral;  . INDUCED ABORTION       OB History    Gravida  5   Para  4   Term  4   Preterm  0   AB  1   Living  4     SAB  0   IAB  1   Ectopic      Multiple  0   Live Births  4           Family History  Problem Relation Age of Onset  . Hypertension Mother   . Hypertension Maternal Grandfather     Social History   Tobacco Use  . Smoking status: Former Smoker    Types: Cigars    Quit date: 08/13/2010    Years since quitting: 10.3  . Smokeless tobacco: Never Used  Vaping Use  . Vaping Use: Never used  Substance Use Topics  . Alcohol use: Yes    Comment: ocassionally  . Drug use: No    Home Medications Prior to Admission medications   Medication Sig Start Date End Date Taking? Authorizing Provider  nirmatrelvir/ritonavir EUA (PAXLOVID) TABS Take 3 tablets by mouth 2 (  two) times daily for 5 days. Patient GFR is 0.8. Take nirmatrelvir (150 mg) 2 tablet(s) twice daily for 5 days and ritonavir (100 mg) one tablet twice daily for 5 days. 12/25/20 12/30/20 Yes Yvonnia Tango, PA-C  HYDROcodone-acetaminophen (NORCO/VICODIN) 5-325 MG tablet Take 1 tablet by mouth every 8 (eight) hours as needed. 12/24/19   Myra Rude, MD  ibuprofen (ADVIL) 600 MG tablet Take 1 tablet (600 mg total) by mouth every 6 (six) hours as needed. 12/20/19   Domenick Gong, MD  predniSONE (DELTASONE) 5 MG tablet Take 6 pills for first day, 5 pills second day, 4 pills third day, 3 pills fourth day, 2 pills the fifth day, and 1 pill sixth day. 12/21/19   Myra Rude, MD    Allergies    Apple and Banana  Review of Systems   Review of  Systems Ten systems reviewed and are negative for acute change, except as noted in the HPI.   Physical Exam Updated Vital Signs BP (!) 113/102 (BP Location: Left Arm)   Pulse (!) 108   Temp (!) 101.2 F (38.4 C)   Resp 20   Ht 5\' 8"  (1.727 m)   Wt 127 kg   SpO2 100%   BMI 42.57 kg/m   Physical Exam Vitals and nursing note reviewed.  Constitutional:      General: She is not in acute distress.    Appearance: She is well-developed. She is ill-appearing. She is not toxic-appearing or diaphoretic.  HENT:     Head: Normocephalic and atraumatic.  Eyes:     General: No scleral icterus.    Conjunctiva/sclera: Conjunctivae normal.  Cardiovascular:     Rate and Rhythm: Normal rate and regular rhythm.     Heart sounds: Normal heart sounds. No murmur heard. No friction rub. No gallop.   Pulmonary:     Effort: Pulmonary effort is normal. No respiratory distress.     Breath sounds: Normal breath sounds.  Abdominal:     General: Bowel sounds are normal. There is no distension.     Palpations: Abdomen is soft. There is no mass.     Tenderness: There is no abdominal tenderness. There is no guarding.  Musculoskeletal:     Cervical back: Normal range of motion.  Skin:    General: Skin is warm and dry.  Neurological:     Mental Status: She is alert and oriented to person, place, and time.  Psychiatric:        Behavior: Behavior normal.     ED Results / Procedures / Treatments   Labs (all labs ordered are listed, but only abnormal results are displayed) Labs Reviewed  RESP PANEL BY RT-PCR (FLU A&B, COVID) ARPGX2 - Abnormal; Notable for the following components:      Result Value   SARS Coronavirus 2 by RT PCR POSITIVE (*)    All other components within normal limits  BASIC METABOLIC PANEL  CBC  I-STAT BETA HCG BLOOD, ED ([facility], [facility], [facility] ONLY)  TROPONIN I (HIGH SENSITIVITY)  TROPONIN I (HIGH SENSITIVITY)    EKG EKG Interpretation  Date/Time:  Sunday Dec 25 2020 10:17:27  EDT Ventricular Rate:  113 PR Interval:  178 QRS Duration: 78 QT Interval:  326 QTC Calculation: 447 R Axis:   41 Text Interpretation: Sinus tachycardia Confirmed by 1973 (Cathren Laine) on 12/25/2020 5:15:49 PM   Radiology DG Chest Port 1 View  Result Date: 12/25/2020 CLINICAL DATA:  Fevers. EXAM: PORTABLE CHEST 1 VIEW COMPARISON:  03/30/2012  FINDINGS: The heart size and mediastinal contours are within normal limits. Both lungs are clear. The visualized skeletal structures are unremarkable. IMPRESSION: No active disease. Electronically Signed   By: Signa Kell M.D.   On: 12/25/2020 11:34    Procedures Procedures   Medications Ordered in ED Medications  acetaminophen (TYLENOL) tablet 1,000 mg (1,000 mg Oral Given 12/25/20 1032)  ibuprofen (ADVIL) tablet 800 mg (has no administration in time range)  dexamethasone (DECADRON) injection 10 mg (has no administration in time range)    ED Course  I have reviewed the triage vital signs and the nursing notes.  Pertinent labs & imaging results that were available during my care of the patient were reviewed by me and considered in my medical decision making (see chart for details).  Clinical Course as of 12/25/20 1423  Sun Dec 25, 2020  1422 Current temp 102.0 [AH]    Clinical Course User Index [AH] Arthor Captain, PA-C   MDM Rules/Calculators/A&P                           Patient here with complaint of flulike symptoms.  Triage labs initiated and I have reviewed them.  CBC, BMP troponin and pregnancy test are all negative.  She is positive for SARS-CoV-2 on respiratory panel.  I have reviewed the patient's 1 view chest x-ray which shows no acute abnormalities or evidence of pneumonia.  EKG shows sinus tachycardia in the rate of 113 in the setting of fever. Patient given Tylenol upfront however had elevation in her fever.  I have ordered Decadron and 800 mg of ibuprofen.  Signout given to oncoming resident to reevaluate patient for  defervescent's.  Suspect outpatient discharge.  Jasmine Hudson was evaluated in Emergency Department on 12/28/2020 for the symptoms described in the history of present illness. She was evaluated in the context of the global COVID-19 pandemic, which necessitated consideration that the patient might be at risk for infection with the SARS-CoV-2 virus that causes COVID-19. Institutional protocols and algorithms that pertain to the evaluation of patients at risk for COVID-19 are in a state of rapid change based on information released by regulatory bodies including the CDC and federal and state organizations. These policies and algorithms were followed during the patient's care in the ED.  Final Clinical Impression(s) / ED Diagnoses Final diagnoses:  None    Rx / DC Orders ED Discharge Orders         Ordered    nirmatrelvir/ritonavir EUA (PAXLOVID) TABS  2 times daily        12/25/20 1355           Arthor Captain, PA-C 12/28/20 2331    Benjiman Core, MD 12/29/20 [phone]

## 2020-12-25 NOTE — ED Triage Notes (Signed)
Pt arrives POV with complaints of chest pain, fever and generalized weakness since 5/14. Pt endorses cough and headache.

## 2020-12-25 NOTE — Discharge Instructions (Addendum)
The current CDC recommendations for isolation are 5 days from the onset of your symptoms (yesterday)  AND fever free without the use of fever reducing medications AND no severe- or improving respiratory symptoms.   Please make sure that you isolate at home as well. Make sure that you wear a mask and that your family sprays down all surfaces with lysol.  Take advil or tylenol for fever reduction or if you have flu or cold symptoms you may take over the counter flu medications. (like nyquil or Mucinex) .  Return to the emergency department if you are having difficulty breathing or vomiting and cannot hold down any fluids.

## 2020-12-25 NOTE — ED Provider Notes (Signed)
  Physical Exam  BP (!) 110/59   Pulse 93   Temp 99.7 F (37.6 C) (Oral)   Resp 18   Ht 5\' 8"  (1.727 m)   Wt 127 kg   SpO2 97%   BMI 42.57 kg/m   Physical Exam Vitals and nursing note reviewed.  Constitutional:      General: She is not in acute distress.    Appearance: She is well-developed.  HENT:     Head: Normocephalic and atraumatic.  Eyes:     Extraocular Movements: Extraocular movements intact.     Conjunctiva/sclera: Conjunctivae normal.  Cardiovascular:     Rate and Rhythm: Normal rate and regular rhythm.  Pulmonary:     Effort: Pulmonary effort is normal. No respiratory distress.  Abdominal:     General: There is no distension.     Hernia: No hernia is present.  Musculoskeletal:        General: No deformity or signs of injury.     Cervical back: Neck supple.  Skin:    General: Skin is warm and dry.  Neurological:     General: No focal deficit present.     Mental Status: She is alert.     Motor: No weakness.  Psychiatric:        Thought Content: Thought content normal.        Judgment: Judgment normal.     ED Course/Procedures   Clinical Course as of 12/25/20 1731  Sun Dec 25, 2020  1422 Current temp 102.0 [AH]    Clinical Course User Index [AH] Dec 27, 2020, PA-C    Procedures  MDM  Per offgoing APP, pt presents with chest pain, fever, is COVID positive. VSS, not hypoxic, Tn wnl, CXR read as no active disease. Per APP, plan is to repeat troponin (first one drawn at 1038), assess fever response to tylenol/motrin.   On bedside evaluation, patient's heart rate is less than 100.   I rechecked her temperature and she is persistently febrile.  Given her overall low risk for ACS, lack of edema in her legs and low likelihood of myocarditis or CHF due to viral causes, offered discharge versus repeat troponin.  She would like to get a second troponin test to be safe.  This was sent but then the patient decided she wanted to leave.  I discussed with her  again and she decided ultimately to stay for that second troponin.  We discussed return precautions. Repeat troponin within normal limits.  Given her previous discussion patient was discharged in stable condition without acute event.     Arthor Captain, MD 12/25/20 1731    12/27/20, MD 12/25/20 2050

## 2020-12-25 NOTE — ED Notes (Signed)
Pt continuously standing up, unsteady on feet,  Pt reminded to stay seated for safety.

## 2021-09-07 ENCOUNTER — Other Ambulatory Visit: Payer: Self-pay | Admitting: Nurse Practitioner

## 2021-09-07 DIAGNOSIS — N920 Excessive and frequent menstruation with regular cycle: Secondary | ICD-10-CM

## 2021-09-14 ENCOUNTER — Other Ambulatory Visit: Payer: Self-pay

## 2021-09-21 ENCOUNTER — Other Ambulatory Visit: Payer: Self-pay

## 2021-10-04 IMAGING — US US EXTREM LOW VENOUS*R*
1 series · 13 of 24 positions shown · non-contrast
Comparison: None.

CLINICAL DATA: Right lower extremity pain



[Series 1: us extrem low venous*right* · 13 of 31 slices shown]
[im 1/31]
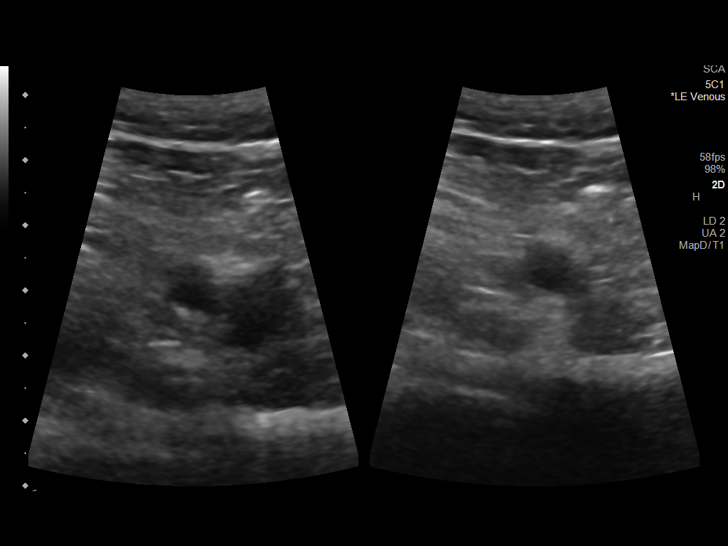
[im 3/31]
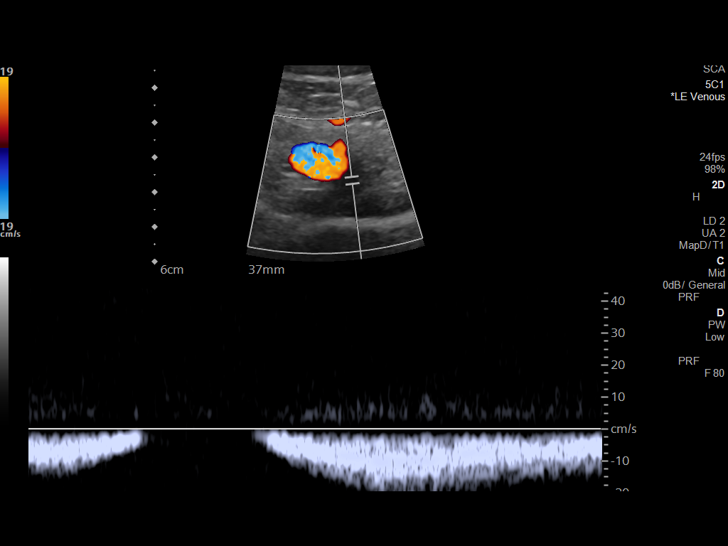
[im 6/31]
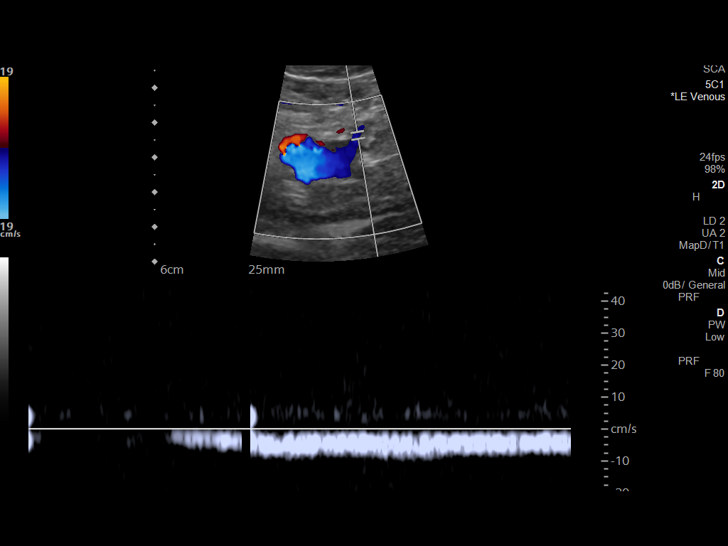
[im 8/31]
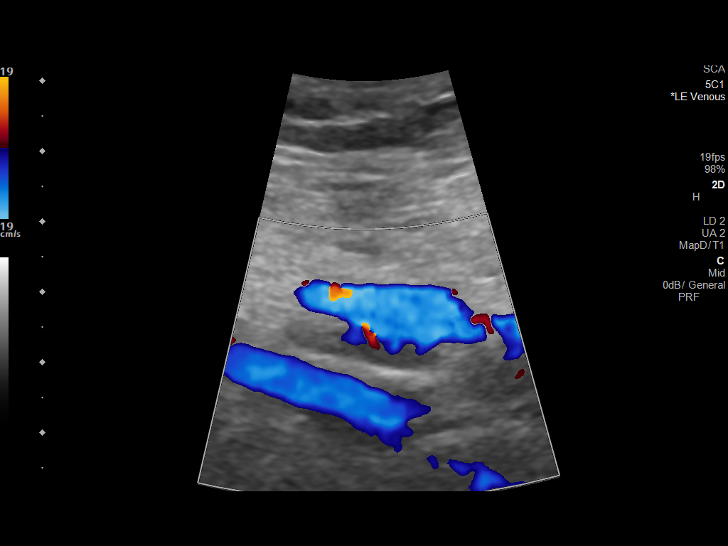
[im 11/31]
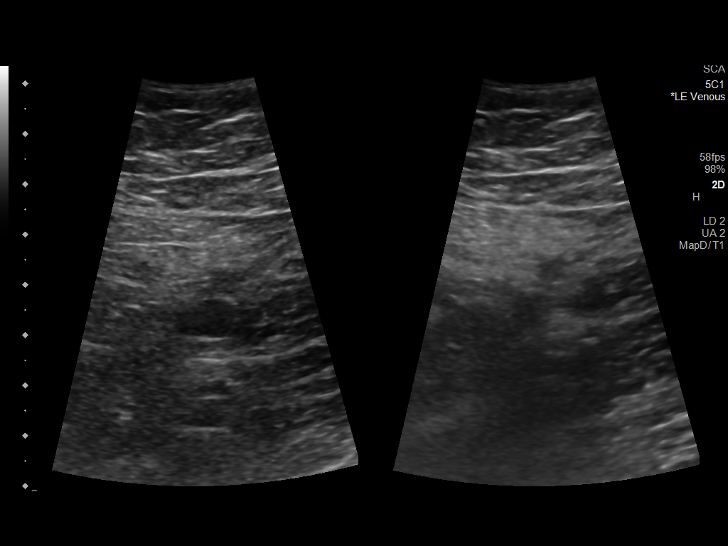
[im 14/31]
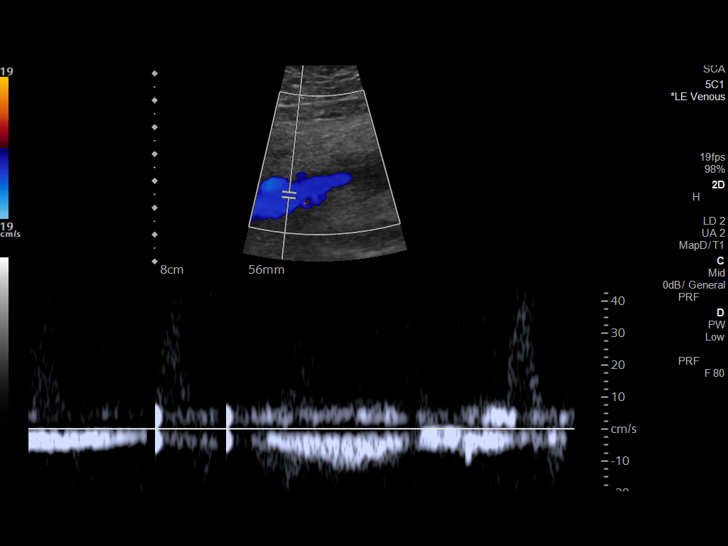
[im 16/31]
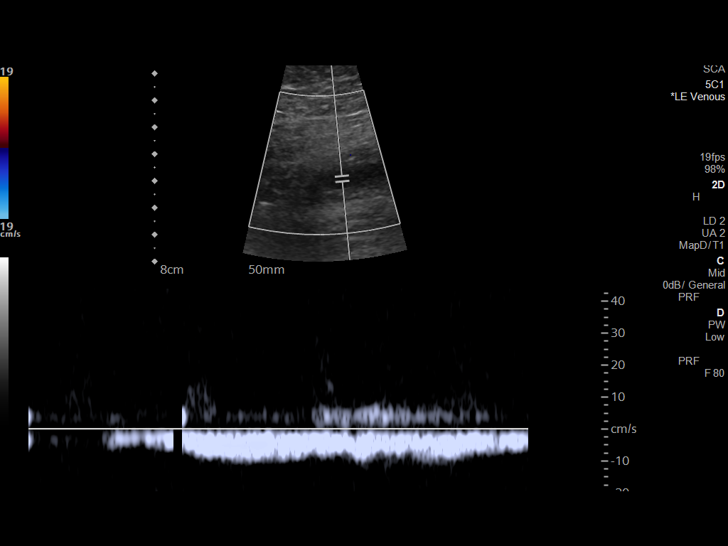
[im 17/31]
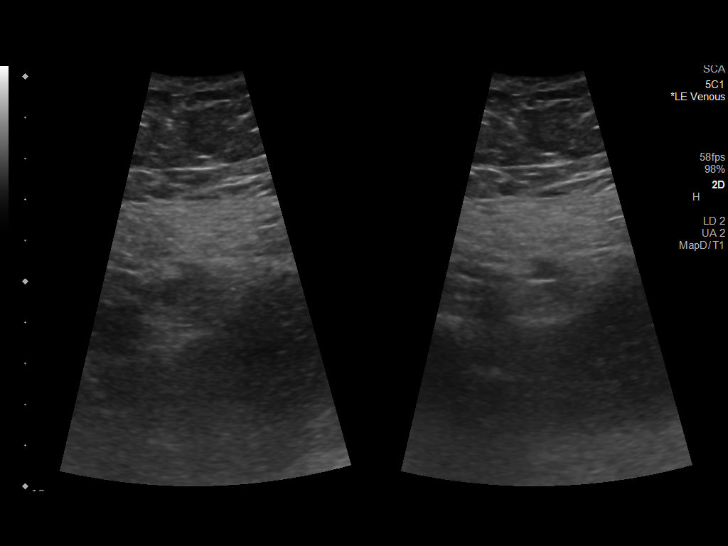
[im 20/31]
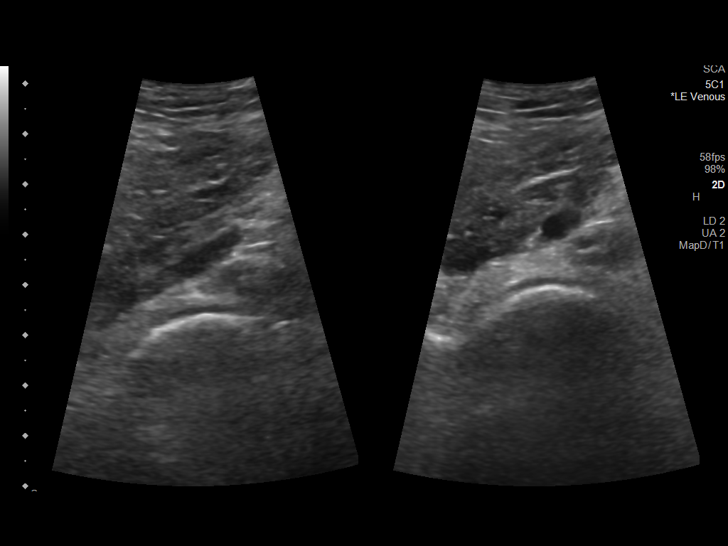
[im 23/31]
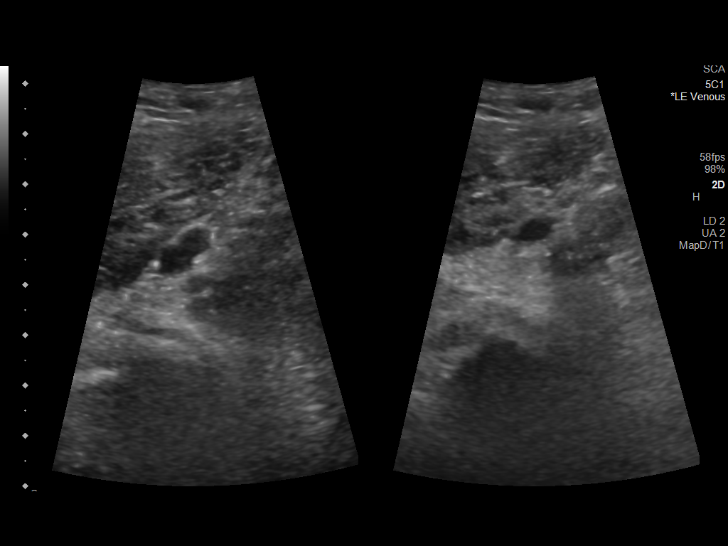
[im 25/31]
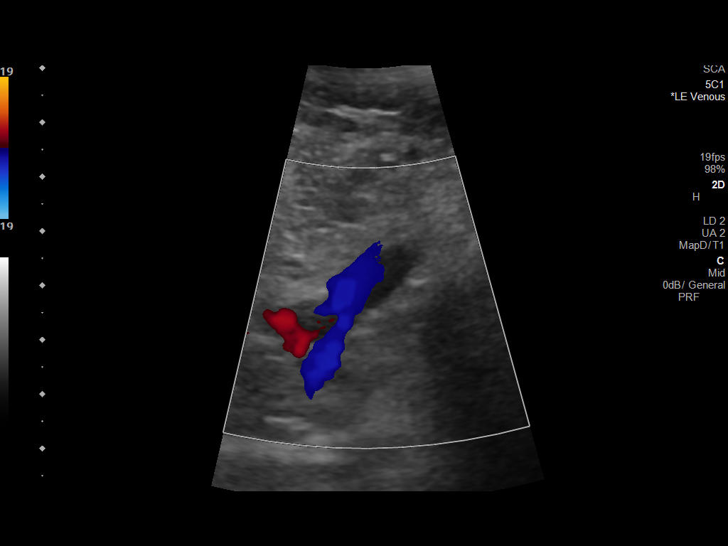
[im 28/31]
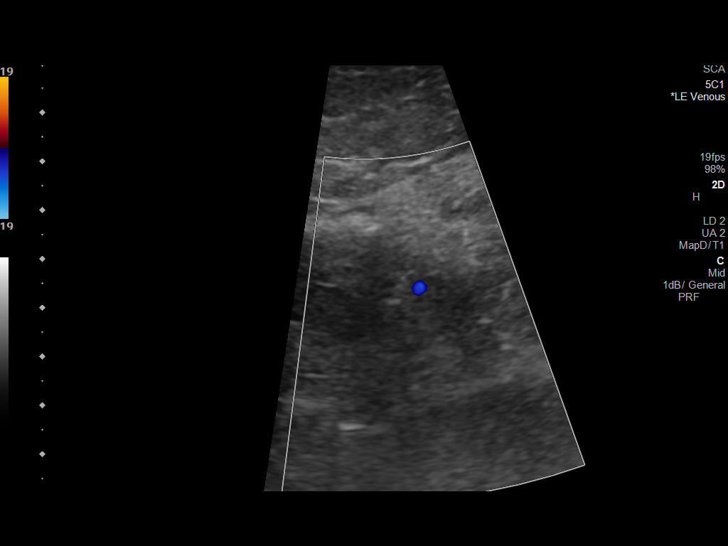
[im 31/31]
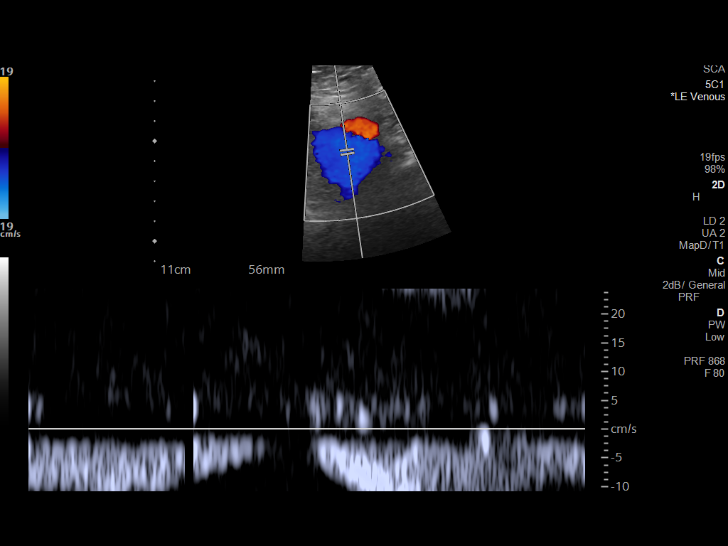

[13 of 24 positions shown; findings below may reference images not displayed]

FINDINGS: Contralateral Common Femoral Vein: Respiratory phasicity is normal
and symmetric with the symptomatic side. No evidence of thrombus.
Normal compressibility.

Common Femoral Vein: No evidence of thrombus. Normal
compressibility, respiratory phasicity and response to augmentation.

Saphenofemoral Junction: No evidence of thrombus. Normal
compressibility and flow on color Doppler imaging.

Profunda Femoral Vein: No evidence of thrombus. Normal
compressibility and flow on color Doppler imaging.

Femoral Vein: No evidence of thrombus. Normal compressibility,
respiratory phasicity and response to augmentation.

Popliteal Vein: No evidence of thrombus. Normal compressibility,
respiratory phasicity and response to augmentation.

Calf Veins: No evidence of thrombus. Normal compressibility and flow
on color Doppler imaging.
IMPRESSION: No evidence of deep venous thrombosis.

## 2021-11-05 ENCOUNTER — Ambulatory Visit (HOSPITAL_COMMUNITY)
Admission: EM | Admit: 2021-11-05 | Discharge: 2021-11-05 | Disposition: A | Discharge status: Home / Self Care | Payer: BLUE CROSS/BLUE SHIELD | Attending: Family Medicine | Admitting: Family Medicine

## 2021-11-05 ENCOUNTER — Encounter (HOSPITAL_COMMUNITY): Payer: Self-pay

## 2021-11-05 ENCOUNTER — Other Ambulatory Visit: Payer: Self-pay

## 2021-11-05 DIAGNOSIS — J069 Acute upper respiratory infection, unspecified: Secondary | ICD-10-CM | POA: Diagnosis present

## 2021-11-05 LAB — POCT RAPID STREP A, ED / UC: Streptococcus, Group A Screen (Direct): NEGATIVE

## 2021-11-05 MED ORDER — BENZONATATE 100 MG PO CAPS: 100.0000 mg | ORAL_CAPSULE | Freq: Three times a day (TID) | ORAL | 0 refills | Status: DC | PRN

## 2021-11-05 MED ORDER — ONDANSETRON 4 MG PO TBDP: 4.0000 mg | ORAL_TABLET | Freq: Three times a day (TID) | ORAL | 0 refills | Status: DC | PRN

## 2021-11-05 NOTE — Discharge Instructions (Addendum)
strep test is negative; ultras sent, and staff will call you if it is positive to send in treatment.-- ? ?Ondansetron dissolved in the mouth every 8 hours as needed for nausea or vomiting. ?Clear liquids and bland things to eat.  ? ?Benzonatate 100 mg--1 capsule 3 times daily as needed for cough ? ? ?

## 2021-11-05 NOTE — ED Triage Notes (Signed)
Pt presents with c/o not being able to get rid of her fever.  ?Pt states she has a sore throat and states she was sneezing a lot last week. Pt states she noticed a tickling feeling in her throat.  ?

## 2021-11-05 NOTE — ED Provider Notes (Signed)
?[facility] ? ? ? ?CSN: [id number] ?Arrival date & time: 11/05/21  1640 ? ? ?  ? ?History   ?Chief Complaint ?Chief Complaint  ?Patient presents with  ? Fever  ? Cough  ? ? ?HPI ?DELENN AHN is a 31 y.o. female.  ? ? ?Fever ?Associated symptoms: cough   ?Cough ?Associated symptoms: fever   ?Here with about a 6-day history of malaise and congestion.  She has had cough and some nasal drainage.  Then beginning March 24 or yesterday March 25, she began having fever to 100.8 and also began having sore throat.  Is very nauseated now but no vomiting or diarrhea so far ? ?Past Medical History:  ?Diagnosis Date  ? Chronic kidney disease   ? Herpes   ? Pyelonephritis affecting pregnancy 2013  ? Trichomoniasis 09/18/2013  ? 09/18/13 Phone number disconnected > requested letter to be sent or inform at next visit   ? ? ?Patient Active Problem List  ? Diagnosis Date Noted  ? Lumbar radiculopathy 12/24/2019  ? Knee effusion, right 12/22/2019  ? Status post repeat low transverse cesarean section 02/08/2018  ? Group B Streptococcus carrier, +RV culture, currently pregnant 01/19/2018  ? 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 Rubella non-immune status, antepartum 11/11/2017  ? Pyelonephritis affecting pregnancy in first trimester 06/29/2017  ? Supervision of normal intrauterine pregnancy in multigravida in first trimester 06/29/2017  ? Previous cesarean section 09/10/2013  ? Herpes 11/22/2011  ? ? ?Past Surgical History:  ?Procedure Laterality Date  ? CESAREAN SECTION  2007  ? CESAREAN SECTION  02/29/2012  ? Procedure: CESAREAN SECTION;  Surgeon: Tilda Burrow, MD;  Location: [facility];  Service: Gynecology;  Laterality: N/A;  Repeat ?  ? CESAREAN SECTION N/A 10/23/2013  ? Procedure: CESAREAN SECTION;  Surgeon: Reva Bores, MD;  Location: [facility];  Service: Obstetrics;  Laterality: N/A;  Small abrasion located on the left lower lateral of the abdomen.  Dermabond applied to the abrasion by Dr. Reola Calkins.  See OR note.  ? CESAREAN SECTION WITH  BILATERAL TUBAL LIGATION Bilateral 02/08/2018  ? Procedure: REPEAT CESAREAN SECTION WITH BILATERAL TUBAL LIGATION;  Surgeon: Reva Bores, MD;  Location: [facility] BIRTHING SUIT[facility];  Service: Obstetrics;  Laterality: Bilateral;  ? INDUCED ABORTION    ? ? ?OB History   ? ? Gravida  ?5  ? Para  ?4  ? Term  ?4  ? Preterm  ?0  ? AB  ?1  ? Living  ?4  ?  ? ? SAB  ?0  ? IAB  ?1  ? Ectopic  ?   ? Multiple  ?0  ? Live Births  ?4  ?   ?  ?  ? ? ? ?Home Medications   ? ?Prior to Admission medications   ?Medication Sig Start Date End Date Taking? Authorizing Provider  ?benzonatate (TESSALON) 100 MG capsule Take 1 capsule (100 mg total) by mouth 3 (three) times daily as needed for cough. 11/05/21  Yes Zenia Resides, MD  ?ondansetron (ZOFRAN-ODT) 4 MG disintegrating tablet Take 1 tablet (4 mg total) by mouth every 8 (eight) hours as needed for nausea or vomiting. 11/05/21  Yes Zenia Resides, MD  ? ? ?Family History ?Family History  ?Problem Relation Age of Onset  ? Hypertension Mother   ? Hypertension Maternal Grandfather   ? ? ?Social History ?Social History  ? ?Tobacco Use  ? Smoking status: Former  ?  Types: Cigars  ?  Quit date:  08/13/2010  ?  Years since quitting: 11.2  ? Smokeless tobacco: Never  ?Vaping Use  ? Vaping Use: Never used  ?Substance Use Topics  ? Alcohol use: Yes  ?  Comment: ocassionally  ? Drug use: No  ? ? ? ?Allergies   ?Apple juice and Banana ? ? ?Review of Systems ?Review of Systems  ?Constitutional:  Positive for fever.  ?Respiratory:  Positive for cough.   ? ? ?Physical Exam ?Triage Vital Signs ?ED Triage Vitals  ?Enc Vitals Group  ?   BP 11/05/21 1739 127/75  ?   Pulse Rate 11/05/21 1739 97  ?   Resp 11/05/21 1739 17  ?   Temp 11/05/21 1739 98.8 ?F (37.1 ?C)  ?   Temp Source 11/05/21 1739 Oral  ?   SpO2 11/05/21 1739 100 %  ?   Weight --   ?   Height --   ?   Head Circumference --   ?   Peak Flow --   ?   Pain Score 11/05/21 1737 6  ?   Pain Loc --   ?   Pain Edu? --   ?   Excl. in GC? --   ? ?No data  found. ? ?Updated Vital Signs ?BP 127/75 (BP Location: Right Arm)   Pulse 97   Temp 98.8 ?F (37.1 ?C) (Oral)   Resp 17   LMP  (LMP Unknown)   SpO2 100%  ? ?Visual Acuity ?Right Eye Distance:   ?Left Eye Distance:   ?Bilateral Distance:   ? ?Right Eye Near:   ?Left Eye Near:    ?Bilateral Near:    ? ?Physical Exam ?Vitals reviewed.  ?Constitutional:   ?   General: She is not in acute distress. ?   Appearance: She is not toxic-appearing.  ?HENT:  ?   Right Ear: Tympanic membrane and ear canal normal.  ?   Left Ear: Tympanic membrane and ear canal normal.  ?   Nose: Nose normal.  ?   Mouth/Throat:  ?   Mouth: Mucous membranes are moist.  ?   Pharynx: No posterior oropharyngeal erythema.  ?   Comments: Some clear mucus is draining in the oropharynx ?Eyes:  ?   Extraocular Movements: Extraocular movements intact.  ?   Conjunctiva/sclera: Conjunctivae normal.  ?   Pupils: Pupils are equal, round, and reactive to light.  ?Cardiovascular:  ?   Rate and Rhythm: Normal rate and regular rhythm.  ?   Heart sounds: No murmur heard. ?Pulmonary:  ?   Effort: Pulmonary effort is normal. No respiratory distress.  ?   Breath sounds: No wheezing, rhonchi or rales.  ?Chest:  ?   Chest wall: No tenderness.  ?Abdominal:  ?   Palpations: Abdomen is soft. There is no mass.  ?   Tenderness: There is abdominal tenderness (mild generalized).  ?Musculoskeletal:  ?   Cervical back: Neck supple.  ?Lymphadenopathy:  ?   Cervical: No cervical adenopathy.  ?Skin: ?   Capillary Refill: Capillary refill takes less than 2 seconds.  ?   Coloration: Skin is not jaundiced or pale.  ?Neurological:  ?   General: No focal deficit present.  ?   Mental Status: She is alert and oriented to person, place, and time.  ?Psychiatric:     ?   Behavior: Behavior normal.  ? ? ? ?UC Treatments / Results  ?Labs ?(all labs ordered are listed, but only abnormal results are displayed) ?Labs Reviewed  ?CULTURE,  GROUP A STREP [facility]  ?POCT RAPID STREP A, ED / UC   ? ? ?EKG ? ? ?Radiology ?No results found. ? ?Procedures ?Procedures (including critical care time) ? ?Medications Ordered in UC ?Medications - No data to display ? ?Initial Impression / Assessment and Plan / UC Course  ?I have reviewed the triage vital signs and the nursing notes. ? ?Pertinent labs & imaging results that were available during my care of the patient were reviewed by me and considered in my medical decision making (see chart for details). ? ?  ? ?Rapid strep is negative; throat culture will be sent.  I suspect viral gastroenteritis on top of URI.  I will not do a COVID swab as she would be past her time of quarantine. ?Final Clinical Impressions(s) / UC Diagnoses  ? ?Final diagnoses:  ?Viral upper respiratory tract infection  ? ? ? ?Discharge Instructions   ? ?  ? strep test is negative; ultras sent, and staff will call you if it is positive to send in treatment.-- ? ?Ondansetron dissolved in the mouth every 8 hours as needed for nausea or vomiting. ?Clear liquids and bland things to eat.  ? ?Benzonatate 100 mg--1 capsule 3 times daily as needed for cough ? ? ? ? ? ? ?ED Prescriptions   ? ? Medication Sig Dispense Auth. Provider  ? benzonatate (TESSALON) 100 MG capsule Take 1 capsule (100 mg total) by mouth 3 (three) times daily as needed for cough. 21 capsule Zenia ResidesBanister, Deniss Wormley K, MD  ? ondansetron (ZOFRAN-ODT) 4 MG disintegrating tablet Take 1 tablet (4 mg total) by mouth every 8 (eight) hours as needed for nausea or vomiting. 10 tablet Zenia ResidesBanister, Genice Kimberlin K, MD  ? ?  ? ?PDMP not reviewed this encounter. ?  ?Zenia ResidesBanister, Colleen Donahoe K, MD ?11/05/21 1846 ? ?

## 2021-11-08 LAB — CULTURE, GROUP A STREP (THRC)

## 2022-07-03 ENCOUNTER — Emergency Department (HOSPITAL_BASED_OUTPATIENT_CLINIC_OR_DEPARTMENT_OTHER)
Admission: EM | Admit: 2022-07-03 | Discharge: 2022-07-03 | Disposition: A | Discharge status: Home / Self Care | Payer: BLUE CROSS/BLUE SHIELD | Attending: Emergency Medicine | Admitting: Emergency Medicine

## 2022-07-03 ENCOUNTER — Encounter (HOSPITAL_BASED_OUTPATIENT_CLINIC_OR_DEPARTMENT_OTHER): Payer: Self-pay | Admitting: Emergency Medicine

## 2022-07-03 ENCOUNTER — Other Ambulatory Visit: Payer: Self-pay

## 2022-07-03 DIAGNOSIS — U071 COVID-19: Secondary | ICD-10-CM | POA: Insufficient documentation

## 2022-07-03 LAB — RESP PANEL BY RT-PCR (FLU A&B, COVID) ARPGX2
Influenza A by PCR: NEGATIVE
Influenza B by PCR: NEGATIVE
SARS Coronavirus 2 by RT PCR: POSITIVE — AB

## 2022-07-03 MED ORDER — IBUPROFEN 400 MG PO TABS
600.0000 mg | ORAL_TABLET | Freq: Once | ORAL | Status: AC
  Administered 2022-07-03: 600 mg via ORAL
  Filled 2022-07-03: qty 1

## 2022-07-03 NOTE — Discharge Instructions (Addendum)
If your fever does not go away, you develop cough with blood, trouble breathing, chest pain, intractable vomiting, or any other new/concerning symptoms and return to the ER for evaluation.

## 2022-07-03 NOTE — ED Triage Notes (Signed)
Pt arrives to ED with c/o body aches and and fever that started yesterday.

## 2022-07-03 NOTE — ED Provider Notes (Signed)
[facility] EMERGENCY DEPT Provider Note   CSN: [id number] Arrival date & time: 07/03/22  2130     History  Chief Complaint  Patient presents with   Fever   Generalized Body Aches    Jasmine Hudson is a 31 y.o. female.  HPI 31 year old female with no chronic medical problems presents with fever and body aches.  Started yesterday.  She has diffuse body aches which includes her chest but she thinks that is more of a part of the body aches than a specific chest pain.  She has had cough, congestion, sore throat, and the body aches.  Has had fever.  She has taken "Mucinex severe".  Home Medications Prior to Admission medications   Medication Sig Start Date End Date Taking? Authorizing Provider  benzonatate (TESSALON) 100 MG capsule Take 1 capsule (100 mg total) by mouth 3 (three) times daily as needed for cough. 11/05/21   Zenia Resides, MD  ondansetron (ZOFRAN-ODT) 4 MG disintegrating tablet Take 1 tablet (4 mg total) by mouth every 8 (eight) hours as needed for nausea or vomiting. 11/05/21   Zenia Resides, MD      Allergies    Apple juice and Banana    Review of Systems   Review of Systems  Constitutional:  Positive for fever.  HENT:  Positive for congestion and sore throat.   Respiratory:  Positive for cough. Negative for shortness of breath.   Musculoskeletal:  Positive for myalgias.    Physical Exam Updated Vital Signs BP 130/71   Pulse (!) 102   Temp (!) 102.2 F (39 C) (Oral)   Resp 16   Ht 5' 7.5" (1.715 m)   Wt 127 kg   SpO2 99%   BMI 43.20 kg/m  Physical Exam Vitals and nursing note reviewed.  Constitutional:      General: She is not in acute distress.    Appearance: She is well-developed. She is obese. She is not ill-appearing or diaphoretic.  HENT:     Head: Normocephalic and atraumatic.     Mouth/Throat:     Pharynx: No oropharyngeal exudate.  Cardiovascular:     Rate and Rhythm: Regular rhythm. Tachycardia present.      Heart sounds: Normal heart sounds.     Comments: HR low 100s Pulmonary:     Effort: Pulmonary effort is normal.     Breath sounds: Normal breath sounds. No wheezing, rhonchi or rales.  Abdominal:     General: There is no distension.  Skin:    General: Skin is warm and dry.  Neurological:     Mental Status: She is alert.     ED Results / Procedures / Treatments   Labs (all labs ordered are listed, but only abnormal results are displayed) Labs Reviewed  RESP PANEL BY RT-PCR (FLU A&B, COVID) ARPGX2 - Abnormal; Notable for the following components:      Result Value   SARS Coronavirus 2 by RT PCR POSITIVE (*)    All other components within normal limits    EKG None  Radiology No results found.  Procedures Procedures    Medications Ordered in ED Medications  ibuprofen (ADVIL) tablet 600 mg (600 mg Oral Given 07/03/22 8657)    ED Course/ Medical Decision Making/ A&P                           Medical Decision Making  Patient presents with about 24 hours of COVID symptoms.  She was given ibuprofen here for fever and body aches.  We discussed results as well as treatment plan.  After discussing potential risk/benefits of Paxlovid, she declines treatment with this.  Recommended ibuprofen, Tylenol, fluids and over-the-counter cough medicine.  Otherwise, no hypoxia and I have low suspicion of pneumonia at this time.  Will discharge home with return precautions.        Final Clinical Impression(s) / ED Diagnoses Final diagnoses:  COVID-19    Rx / DC Orders ED Discharge Orders     None         Sherwood Gambler, MD 07/03/22 [phone]

## 2022-07-03 NOTE — ED Notes (Signed)
Pt given discharge instructions. Opportunities given for questions. Pt verbalizes understanding. Syreeta Figler R, RN 

## 2022-07-03 NOTE — ED Notes (Signed)
Patient provided with gingerale per request.

## 2022-11-28 ENCOUNTER — Encounter: Payer: Self-pay | Admitting: *Deleted

## 2023-03-16 ENCOUNTER — Other Ambulatory Visit: Payer: Self-pay

## 2023-03-16 ENCOUNTER — Encounter (HOSPITAL_BASED_OUTPATIENT_CLINIC_OR_DEPARTMENT_OTHER): Payer: Self-pay

## 2023-03-16 ENCOUNTER — Emergency Department (HOSPITAL_BASED_OUTPATIENT_CLINIC_OR_DEPARTMENT_OTHER): Payer: Medicaid Other

## 2023-03-16 ENCOUNTER — Emergency Department (HOSPITAL_BASED_OUTPATIENT_CLINIC_OR_DEPARTMENT_OTHER)
Admission: EM | Admit: 2023-03-16 | Discharge: 2023-03-16 | Discharge status: Against Medical Advice | Payer: Medicaid Other | Attending: Emergency Medicine | Admitting: Emergency Medicine

## 2023-03-16 DIAGNOSIS — K810 Acute cholecystitis: Secondary | ICD-10-CM | POA: Insufficient documentation

## 2023-03-16 DIAGNOSIS — D75839 Thrombocytosis, unspecified: Secondary | ICD-10-CM | POA: Diagnosis not present

## 2023-03-16 DIAGNOSIS — D649 Anemia, unspecified: Secondary | ICD-10-CM | POA: Insufficient documentation

## 2023-03-16 DIAGNOSIS — Z5329 Procedure and treatment not carried out because of patient's decision for other reasons: Secondary | ICD-10-CM | POA: Diagnosis not present

## 2023-03-16 DIAGNOSIS — R1011 Right upper quadrant pain: Secondary | ICD-10-CM | POA: Diagnosis present

## 2023-03-16 DIAGNOSIS — R112 Nausea with vomiting, unspecified: Secondary | ICD-10-CM

## 2023-03-16 LAB — CBC WITH DIFFERENTIAL/PLATELET
Abs Immature Granulocytes: 0.02 10*3/uL (ref 0.00–0.07)
Basophils Absolute: 0 10*3/uL (ref 0.0–0.1)
Basophils Relative: 0 %
Eosinophils Absolute: 0.2 10*3/uL (ref 0.0–0.5)
Eosinophils Relative: 2 %
HCT: 35.2 % — ABNORMAL LOW (ref 36.0–46.0)
Hemoglobin: 11.5 g/dL — ABNORMAL LOW (ref 12.0–15.0)
Immature Granulocytes: 0 %
Lymphocytes Relative: 30 %
Lymphs Abs: 2.5 10*3/uL (ref 0.7–4.0)
MCH: 27.3 pg (ref 26.0–34.0)
MCHC: 32.7 g/dL (ref 30.0–36.0)
MCV: 83.4 fL (ref 80.0–100.0)
Monocytes Absolute: 0.4 10*3/uL (ref 0.1–1.0)
Monocytes Relative: 4 %
Neutro Abs: 5.3 10*3/uL (ref 1.7–7.7)
Neutrophils Relative %: 64 %
Platelets: 437 10*3/uL — ABNORMAL HIGH (ref 150–400)
RBC: 4.22 MIL/uL (ref 3.87–5.11)
RDW: 15.6 % — ABNORMAL HIGH (ref 11.5–15.5)
WBC: 8.4 10*3/uL (ref 4.0–10.5)
nRBC: 0 % (ref 0.0–0.2)

## 2023-03-16 LAB — COMPREHENSIVE METABOLIC PANEL
ALT: 15 U/L (ref 0–44)
AST: 19 U/L (ref 15–41)
Albumin: 4.1 g/dL (ref 3.5–5.0)
Alkaline Phosphatase: 59 U/L (ref 38–126)
Anion gap: 8 (ref 5–15)
BUN: 6 mg/dL (ref 6–20)
CO2: 26 mmol/L (ref 22–32)
Calcium: 9.4 mg/dL (ref 8.9–10.3)
Chloride: 106 mmol/L (ref 98–111)
Creatinine, Ser: 0.69 mg/dL (ref 0.44–1.00)
GFR, Estimated: >60 mL/min (ref >60)
Glucose, Bld: 95 mg/dL (ref 70–99)
Potassium: 4.1 mmol/L (ref 3.5–5.1)
Sodium: 140 mmol/L (ref 135–145)
Total Bilirubin: 0.2 mg/dL — ABNORMAL LOW (ref 0.3–1.2)
Total Protein: 7.2 g/dL (ref 6.5–8.1)

## 2023-03-16 LAB — LIPASE, BLOOD: Lipase: 37 U/L (ref 11–51)

## 2023-03-16 LAB — HCG, SERUM, QUALITATIVE: Preg, Serum: NEGATIVE

## 2023-03-16 MED ORDER — PIPERACILLIN-TAZOBACTAM 3.375 G IVPB 30 MIN
3.3750 g | Freq: Once | INTRAVENOUS | Status: AC
  Administered 2023-03-16: 3.375 g via INTRAVENOUS
  Filled 2023-03-16: qty 50

## 2023-03-16 MED ORDER — DIPHENHYDRAMINE HCL 50 MG/ML IJ SOLN: 50.0000 mg | Freq: Once | INTRAMUSCULAR | Status: AC

## 2023-03-16 MED ORDER — DIPHENHYDRAMINE HCL 50 MG/ML IJ SOLN
INTRAMUSCULAR | Status: AC
  Administered 2023-03-16: 50 mg via INTRAVENOUS
  Filled 2023-03-16: qty 1

## 2023-03-16 MED ORDER — ONDANSETRON HCL 4 MG PO TABS: 4.0000 mg | ORAL_TABLET | Freq: Three times a day (TID) | ORAL | 0 refills | Status: DC | PRN

## 2023-03-16 MED ORDER — HYOSCYAMINE SULFATE 0.125 MG SL SUBL: 0.1250 mg | SUBLINGUAL_TABLET | SUBLINGUAL | 0 refills | Status: DC | PRN

## 2023-03-16 MED ORDER — ONDANSETRON HCL 4 MG/2ML IJ SOLN
4.0000 mg | Freq: Once | INTRAMUSCULAR | Status: AC
  Administered 2023-03-16: 4 mg via INTRAVENOUS
  Filled 2023-03-16: qty 2

## 2023-03-16 MED ORDER — PIPERACILLIN-TAZOBACTAM 3.375 G IVPB: 3.3750 g | Freq: Three times a day (TID) | INTRAVENOUS | Status: DC

## 2023-03-16 MED ORDER — DICYCLOMINE HCL 20 MG PO TABS: 20.0000 mg | ORAL_TABLET | Freq: Two times a day (BID) | ORAL | 0 refills | Status: DC

## 2023-03-16 MED ORDER — SODIUM CHLORIDE 0.9 % IV BOLUS
1000.0000 mL | Freq: Once | INTRAVENOUS | Status: AC
  Administered 2023-03-16: 1000 mL via INTRAVENOUS

## 2023-03-16 MED ORDER — MORPHINE SULFATE (PF) 4 MG/ML IV SOLN
4.0000 mg | Freq: Once | INTRAVENOUS | Status: AC
  Administered 2023-03-16: 4 mg via INTRAVENOUS
  Filled 2023-03-16: qty 1

## 2023-03-16 MED ORDER — METRONIDAZOLE 500 MG PO TABS: 500.0000 mg | ORAL_TABLET | Freq: Three times a day (TID) | ORAL | 0 refills | Status: AC

## 2023-03-16 MED ORDER — CIPROFLOXACIN HCL 500 MG PO TABS: 500.0000 mg | ORAL_TABLET | Freq: Two times a day (BID) | ORAL | 0 refills | Status: AC

## 2023-03-16 NOTE — ED Provider Notes (Signed)
32 yo female presenting with RUQ abdominal pain after eating last night Labs unremarkable Pending biliary ultrasound this morning  Physical Exam  BP (!) 101/57   Pulse 69   Temp 98.2 F (36.8 C) (Oral)   Resp 15   Ht 5\' 7"  (1.702 m)   Wt 130.2 kg   LMP 03/08/2023   SpO2 96%   BMI 44.95 kg/m   Physical Exam  Procedures  Procedures  ED Course / MDM    Medical Decision Making Amount and/or Complexity of Data Reviewed Labs: ordered. Radiology: ordered.  Risk Prescription drug management. Decision regarding hospitalization.   Patient is workup reviewed.  Ultrasound findings concerning for acute cholecystitis with gallstone noted in the neck of the gallbladder.  Overall the patient's pain has improved but she is still having discomfort and nausea.  No evidence of sepsis.  I will consult the general surgeon and anticipating hospitalization.  Patient and her family at the bedside were updated and in agreement  1010 am - accepted by Dr Violeta Gelinas for med/surg bed.  Pt can have clear liquids today, NPO at midnight.      Terald Sleeper, MD 03/16/23 1010

## 2023-03-16 NOTE — ED Provider Notes (Signed)
At the time of transfer to the hospital the patient chose to leave AGAINST MEDICAL ADVICE.  She understands that her condition is unlikely to improve with outpatient management.  She understands my clear recommendation would be to stay in the hospital for surgical treatment and evaluation.  She understands the risk of bleeding could be worsening pain, gallbladder rupture, sepsis, infection and death.  I prescribed her antibiotics orally and gave written instructions.   Terald Sleeper, MD 03/16/23 1536

## 2023-03-16 NOTE — ED Notes (Signed)
Kiana with cl called for transport 

## 2023-03-16 NOTE — ED Triage Notes (Signed)
Patient c/o right sided abdominal pain radiating to upper abdomen after eating Chipotle last night around 8 or 9 pm. Last episode of emesis about an hour PTA. Denies diarrhea. States took magnesium citrate because she thought she was constipated.

## 2023-03-16 NOTE — ED Notes (Signed)
Pt expressed desire to find alternative solution to cholecystectomy for her signs/symptoms. RN, EDP, and EDPA all communicated the risks of refusing transport/surgery including but not limited to the risk of death. Pt was adamant that she wanted to seek a second opinion with her primary care provider. Pt was provided AVS with instructions on signs/symptoms that require emergency evaluation and she agreed to understanding same. AMA and witness signatures obtained.

## 2023-03-16 NOTE — Discharge Instructions (Addendum)
You decided to leave the emergency department AGAINST MEDICAL ADVICE today.  We told you that you have signs of an inflamed gallbladder, which may become a surgical or infectious emergency.  Inflamed gallbladders cam rupture and lead to serious infections,  including sepsis.  This is unlikely to improve on its own.  We recommended that you stay in the hospital to be seen by a surgeon, but you chose to leave.  You understand the risk of leaving to include worsening pain, persistent vomiting, serious infection, stroke and death.  I prescribed antibiotics and open encourage you to pick up and begin taking at home.  Please do not drink alcohol while taking these antibiotics.  I prescribed a medicine called Bentyl which can help with abdominal cramping pain.  Please be aware that oral medications are not a substitute for surgery or treatment in the hospital.  If your symptoms worsen, or you have persistent vomiting, fevers, or any other emergency concerns, I encourage you to return to the hospital soon as possible.

## 2023-03-16 NOTE — ED Provider Notes (Signed)
[place] EMERGENCY DEPARTMENT AT [facility] Provider Note   CSN: [id number] Arrival date & time: 03/16/23  7846     History  Chief Complaint  Patient presents with   Abdominal Pain    Jasmine Hudson is a 32 y.o. female.  The history is provided by the patient.  Abdominal Pain She had onset last night of epigastric and right upper quadrant pain with some radiation to the back.  There is associated nausea and vomiting.  Symptoms started after eating Chipotle.  She has had chills but no fever or sweats.  She does not recall ever having episodes like this before.   Home Medications Prior to Admission medications   Medication Sig Start Date End Date Taking? Authorizing Provider  benzonatate (TESSALON) 100 MG capsule Take 1 capsule (100 mg total) by mouth 3 (three) times daily as needed for cough. 11/05/21   Zenia Resides, MD  ondansetron (ZOFRAN-ODT) 4 MG disintegrating tablet Take 1 tablet (4 mg total) by mouth every 8 (eight) hours as needed for nausea or vomiting. 11/05/21   Zenia Resides, MD      Allergies    Apple juice and Banana    Review of Systems   Review of Systems  Gastrointestinal:  Positive for abdominal pain.  All other systems reviewed and are negative.   Physical Exam Updated Vital Signs BP 134/76   Pulse 83   Temp 98.1 F (36.7 C)   Resp 18   Ht 5\' 7"  (1.702 m)   Wt 130.2 kg   LMP 03/08/2023   SpO2 100%   BMI 44.95 kg/m  Physical Exam Vitals and nursing note reviewed.   32 year old female, resting comfortably and in no acute distress. Vital signs are normal. Oxygen saturation is 100%, which is normal. Head is normocephalic and atraumatic. PERRLA, EOMI. Oropharynx is clear. Neck is nontender and supple. Back is nontender and there is no CVA tenderness. Lungs are clear without rales, wheezes, or rhonchi. Chest is nontender. Heart has regular rate and rhythm without murmur. Abdomen is soft, flat, with moderate epigastric and  right upper quadrant tenderness with positive Murphy sign. Extremities have no cyanosis or edema, full range of motion is present. Skin is warm and dry without rash. Neurologic: Mental status is normal, cranial nerves are intact, moves all extremities equally.  ED Results / Procedures / Treatments   Labs (all labs ordered are listed, but only abnormal results are displayed) Labs Reviewed - No data to display  EKG None  Radiology No results found.  Procedures Procedures    Medications Ordered in ED Medications - No data to display  ED Course/ Medical Decision Making/ A&P                                 Medical Decision Making Amount and/or Complexity of Data Reviewed Labs: ordered. Radiology: ordered.  Risk Prescription drug management.   Right upper quadrant pain and tenderness strongly suggestive of biliary colic.  Differential diagnosis includes, but is not limited to, pancreatitis, diverticulitis, peptic ulcer disease, GERD.  The sick differential which includes conditions with significant risk of morbidity and complication is.  I have ordered IV fluids, morphine for pain, ondansetron for nausea.  I have ordered laboratory workup of CBC, comprehensive metabolic panel, lipase.  I have ordered a right upper quadrant ultrasound.  Following above-noted treatment, patient is resting comfortably.  I have reviewed and  interpreted her laboratory tests, and my interpretation is negative pregnancy test, normal comprehensive metabolic panel, normal lipase, mild anemia and mild thrombocytosis which is probably reactive.  Case is signed out to Dr. Renaye Rakers.  Final Clinical Impression(s) / ED Diagnoses Final diagnoses:  RUQ pain  Nausea and vomiting, unspecified vomiting type  Normochromic normocytic anemia  Thrombocytosis    Rx / DC Orders ED Discharge Orders     None         Dione Booze, MD 03/16/23 [phone]

## 2023-03-16 NOTE — ED Provider Notes (Signed)
Patient developed hives around her chest and lower face within 30 minutes and completing her IV Zosyn infusion.  I confirmed with her that she had no known prior antibiotic allergies, including penicillins.  However this reaction is highly consistent with an allergic reaction.  She was given Benadryl.  Hives are improving.  No evidence of anaphylaxis, airway involvement, or indication for epinephrine.  Zosyn was added to her allergy list.  She is not requiring further antibiotics at this time as she did complete the course.   Terald Sleeper, MD 03/16/23 [phone]

## 2023-05-03 ENCOUNTER — Ambulatory Visit: Payer: Self-pay | Admitting: General Surgery

## 2023-05-03 NOTE — Progress Notes (Signed)
SDW call. Spoke with patient, she is calling Dr. Laurell Josephs Thompson's office to re-schedule surgery due to work constraints.

## 2023-05-06 ENCOUNTER — Encounter (HOSPITAL_COMMUNITY): Admission: RE | Payer: Self-pay | Source: Home / Self Care

## 2023-05-06 ENCOUNTER — Ambulatory Visit (HOSPITAL_COMMUNITY): Admission: RE | Admit: 2023-05-06 | Payer: Medicaid Other | Source: Home / Self Care | Admitting: General Surgery

## 2023-05-06 SURGERY — LAPAROSCOPIC CHOLECYSTECTOMY
Anesthesia: General

## 2023-05-29 NOTE — Pre-Procedure Instructions (Signed)
Surgical Instructions   Your procedure is scheduled on June 05, 2023. Report to [facility] Main Entrance "A" at 7:30 A.M., then check in with the Admitting office. Any questions or running late day of surgery: call [phone]  Questions prior to your surgery date: call [phone], Monday-Friday, 8am-4pm. If you experience any cold or flu symptoms such as cough, fever, chills, shortness of breath, etc. between now and your scheduled surgery, please notify us at the above number.     Remember:  Do not eat after midnight the night before your surgery   You may drink clear liquids until 5:30 AM the morning of your surgery.   Clear liquids allowed are: Water, Non-Citrus Juices (without pulp), Carbonated Beverages, Clear Tea, Black Coffee Only (NO MILK, CREAM OR POWDERED CREAMER of any kind), and Gatorade.    Take these medicines the morning of surgery with A SIP OF WATER: hyoscyamine (LEVSIN/SL) - may take if needed   One week prior to surgery, STOP taking any Aspirin (unless otherwise instructed by your surgeon) Aleve, Naproxen, Ibuprofen, Motrin, Advil, Goody's, BC's, all herbal medications, fish oil, and non-prescription vitamins.                     Do NOT Smoke (Tobacco/Vaping) for 24 hours prior to your procedure.  If you use a CPAP at night, you may bring your mask/headgear for your overnight stay.   You will be asked to remove any contacts, glasses, piercing's, hearing aid's, dentures/partials prior to surgery. Please bring cases for these items if needed.    Patients discharged the day of surgery will not be allowed to drive home, and someone needs to stay with them for 24 hours.  SURGICAL WAITING ROOM VISITATION Patients may have no more than 2 support people in the waiting area - these visitors may rotate.   Pre-op nurse will coordinate an appropriate time for 1 ADULT support person, who may not rotate, to accompany patient in pre-op.  Children under the age of 77  must have an adult with them who is not the patient and must remain in the main waiting area with an adult.  If the patient needs to stay at the hospital during part of their recovery, the visitor guidelines for inpatient rooms apply.  Please refer to the [facility] website for the visitor guidelines for any additional information.   If you received a COVID test during your pre-op visit  it is requested that you wear a mask when out in public, stay away from anyone that may not be feeling well and notify your surgeon if you develop symptoms. If you have been in contact with anyone that has tested positive in the last 10 days please notify you surgeon.      Pre-operative CHG Bathing Instructions   You can play a key role in reducing the risk of infection after surgery. Your skin needs to be as free of germs as possible. You can reduce the number of germs on your skin by washing with CHG (chlorhexidine gluconate) soap before surgery. CHG is an antiseptic soap that kills germs and continues to kill germs even after washing.   DO NOT use if you have an allergy to chlorhexidine/CHG or antibacterial soaps. If your skin becomes reddened or irritated, stop using the CHG and notify one of our RNs at [phone].              TAKE A SHOWER THE NIGHT BEFORE SURGERY AND THE DAY  OF SURGERY    Please keep in mind the following:  DO NOT shave, including legs and underarms, 48 hours prior to surgery.   You may shave your face before/day of surgery.  Place clean sheets on your bed the night before surgery Use a clean washcloth (not used since being washed) for each shower. DO NOT sleep with pet's night before surgery.  CHG Shower Instructions:  Wash your face and private area with normal soap. If you choose to wash your hair, wash first with your normal shampoo.  After you use shampoo/soap, rinse your hair and body thoroughly to remove shampoo/soap residue.  Turn the water OFF and apply half the  bottle of CHG soap to a CLEAN washcloth.  Apply CHG soap ONLY FROM YOUR NECK DOWN TO YOUR TOES (washing for 3-5 minutes)  DO NOT use CHG soap on face, private areas, open wounds, or sores.  Pay special attention to the area where your surgery is being performed.  If you are having back surgery, having someone wash your back for you may be helpful. Wait 2 minutes after CHG soap is applied, then you may rinse off the CHG soap.  Pat dry with a clean towel  Put on clean pajamas    Additional instructions for the day of surgery: DO NOT APPLY any lotions, deodorants, cologne, or perfumes.   Do not wear jewelry or makeup Do not wear nail polish, gel polish, artificial nails, or any other type of covering on natural nails (fingers and toes) Do not bring valuables to the hospital. [facility] is not responsible for valuables/personal belongings. Put on clean/comfortable clothes.  Please brush your teeth.  Ask your nurse before applying any prescription medications to the skin.

## 2023-05-30 ENCOUNTER — Inpatient Hospital Stay (HOSPITAL_COMMUNITY)
Admission: RE | Admit: 2023-05-30 | Discharge: 2023-05-30 | Disposition: A | Discharge status: Home / Self Care | Payer: Medicaid Other | Source: Ambulatory Visit

## 2023-05-31 NOTE — Progress Notes (Signed)
Pt did not show up for PAT appt.

## 2023-06-04 ENCOUNTER — Encounter (HOSPITAL_COMMUNITY): Payer: Self-pay | Admitting: Physician Assistant

## 2023-06-04 ENCOUNTER — Encounter (HOSPITAL_COMMUNITY): Payer: Self-pay | Admitting: General Surgery

## 2023-06-04 ENCOUNTER — Other Ambulatory Visit: Payer: Self-pay

## 2023-06-04 NOTE — Progress Notes (Signed)
PCP - Norva Riffle, PA Cardiologist - denies  PPM/ICD - denies   Chest x-ray - 12/25/20 EKG - n/a Stress Test - denies ECHO - denies Cardiac Cath - denies  CPAP - denies  DM- denies  ASA/Blood Thinner Instructions: n/a  ERAS Protcol - clears until 0430  COVID TEST- n/a  Anesthesia review: no  Patient verbally denies any shortness of breath, fever, cough and chest pain during phone call   -------------  SDW INSTRUCTIONS given:  Your procedure is scheduled on 10/23.  Report to [facility] Main Entrance "A" at 0530 A.M., and check in at the Admitting office.  Call this number if you have problems the morning of surgery:  [phone]   Remember:  Do not eat after midnight the night before your surgery  You may drink clear liquids until 0430 the morning of your surgery.   Clear liquids allowed are: Water, Non-Citrus Juices (without pulp), Carbonated Beverages, Clear Tea, Black Coffee Only, and Gatorade    Take these medicines the morning of surgery with A SIP OF WATER  Hyoscyamine (PRN)  As of today, STOP taking any Aspirin (unless otherwise instructed by your surgeon) Aleve, Naproxen, Ibuprofen, Motrin, Advil, Goody's, BC's, all herbal medications, fish oil, and all vitamins.                      Do not wear jewelry, make up, or nail polish            Do not wear lotions, powders, perfumes/colognes, or deodorant.            Do not shave 48 hours prior to surgery.  Men may shave face and neck.            Do not bring valuables to the hospital.            [facility] is not responsible for any belongings or valuables.  Do NOT Smoke (Tobacco/Vaping) 24 hours prior to your procedure If you use a CPAP at night, you may bring all equipment for your overnight stay.   Contacts, glasses, dentures or bridgework may not be worn into surgery.      For patients admitted to the hospital, discharge time will be determined by your treatment team.   Patients discharged the day  of surgery will not be allowed to drive home, and someone needs to stay with them for 24 hours.    Special instructions:   [place]- Preparing For Surgery  Before surgery, you can play an important role. Because skin is not sterile, your skin needs to be as free of germs as possible. You can reduce the number of germs on your skin by washing with CHG (chlorahexidine gluconate) Soap before surgery.  CHG is an antiseptic cleaner which kills germs and bonds with the skin to continue killing germs even after washing.    Oral Hygiene is also important to reduce your risk of infection.  Remember - BRUSH YOUR TEETH THE MORNING OF SURGERY WITH YOUR REGULAR TOOTHPASTE  Please do not use if you have an allergy to CHG or antibacterial soaps. If your skin becomes reddened/irritated stop using the CHG.  Do not shave (including legs and underarms) for at least 48 hours prior to first CHG shower. It is OK to shave your face.  Please follow these instructions carefully.   Shower the NIGHT BEFORE SURGERY and the MORNING OF SURGERY with DIAL Soap.   Pat yourself dry with a CLEAN TOWEL.  Wear CLEAN PAJAMAS to bed the night before surgery  Place CLEAN SHEETS on your bed the night of your first shower and DO NOT SLEEP WITH PETS.   Day of Surgery: Please shower morning of surgery  Wear Clean/Comfortable clothing the morning of surgery Do not apply any deodorants/lotions.   Remember to brush your teeth WITH YOUR REGULAR TOOTHPASTE.   Questions were answered. Patient verbalized understanding of instructions.

## 2023-06-04 NOTE — Progress Notes (Signed)
Pt is having acute URI symptoms. Dr. Carollee Massed office was notified that surgery will have to be postponed until 2 weeks after symptoms resolve. Pt aware. Surgeon's office aware.

## 2023-06-05 ENCOUNTER — Ambulatory Visit (HOSPITAL_COMMUNITY): Admission: RE | Admit: 2023-06-05 | Payer: Medicaid Other | Source: Home / Self Care | Admitting: General Surgery

## 2023-06-05 SURGERY — LAPAROSCOPIC CHOLECYSTECTOMY
Anesthesia: General

## 2023-08-09 ENCOUNTER — Ambulatory Visit: Payer: Self-pay | Admitting: General Surgery

## 2023-08-09 NOTE — Progress Notes (Signed)
Surgical Instructions   Your procedure is scheduled on Monday, January 6th, 2025. Report to [facility] Main Entrance "A" at 7:30 A.M., then check in with the Admitting office. Any questions or running late day of surgery: call [phone]  Questions prior to your surgery date: call [phone], Monday-Friday, 8am-4pm. If you experience any cold or flu symptoms such as cough, fever, chills, shortness of breath, etc. between now and your scheduled surgery, please notify us at the above number.     Remember:  Do not eat after midnight the night before your surgery   You may drink clear liquids until 6:30 the morning of your surgery.    Clear liquids allowed are: Water, Non-Citrus Juices (without pulp), Carbonated Beverages, Clear Tea (no milk, honey, etc.), Black Coffee Only (NO MILK, CREAM OR POWDERED CREAMER of any kind), and Gatorade.    Take these medicines the morning of surgery with A SIP OF WATER:  None   May take these medicines IF NEEDED: Acetaminophen (Tylenol)    One week prior to surgery, STOP taking any Aspirin (unless otherwise instructed by your surgeon) Aleve, Naproxen, Ibuprofen, Motrin, Advil, Goody's, BC's, all herbal medications, fish oil, and non-prescription vitamins.                     Do NOT Smoke (Tobacco/Vaping) for 24 hours prior to your procedure.  If you use a CPAP at night, you may bring your mask/headgear for your overnight stay.   You will be asked to remove any contacts, glasses, piercing's, hearing aid's, dentures/partials prior to surgery. Please bring cases for these items if needed.    Patients discharged the day of surgery will not be allowed to drive home, and someone needs to stay with them for 24 hours.  SURGICAL WAITING ROOM VISITATION Patients may have no more than 2 support people in the waiting area - these visitors may rotate.   Pre-op nurse will coordinate an appropriate time for 1 ADULT support person, who may not rotate, to  accompany patient in pre-op.  Children under the age of 33 must have an adult with them who is not the patient and must remain in the main waiting area with an adult.  If the patient needs to stay at the hospital during part of their recovery, the visitor guidelines for inpatient rooms apply.  Please refer to the [facility] website for the visitor guidelines for any additional information.   If you received a COVID test during your pre-op visit  it is requested that you wear a mask when out in public, stay away from anyone that may not be feeling well and notify your surgeon if you develop symptoms. If you have been in contact with anyone that has tested positive in the last 10 days please notify you surgeon.      Pre-operative CHG Bathing Instructions   You can play a key role in reducing the risk of infection after surgery. Your skin needs to be as free of germs as possible. You can reduce the number of germs on your skin by washing with CHG (chlorhexidine gluconate) soap before surgery. CHG is an antiseptic soap that kills germs and continues to kill germs even after washing.   DO NOT use if you have an allergy to chlorhexidine/CHG or antibacterial soaps. If your skin becomes reddened or irritated, stop using the CHG and notify one of our RNs at [phone].  TAKE A SHOWER THE NIGHT BEFORE SURGERY AND THE DAY OF SURGERY    Please keep in mind the following:  DO NOT shave, including legs and underarms, 48 hours prior to surgery.   You may shave your face before/day of surgery.  Place clean sheets on your bed the night before surgery Use a clean washcloth (not used since being washed) for each shower. DO NOT sleep with pet's night before surgery.  CHG Shower Instructions:  Wash your face and private area with normal soap. If you choose to wash your hair, wash first with your normal shampoo.  After you use shampoo/soap, rinse your hair and body thoroughly to remove  shampoo/soap residue.  Turn the water OFF and apply half the bottle of CHG soap to a CLEAN washcloth.  Apply CHG soap ONLY FROM YOUR NECK DOWN TO YOUR TOES (washing for 3-5 minutes)  DO NOT use CHG soap on face, private areas, open wounds, or sores.  Pay special attention to the area where your surgery is being performed.  If you are having back surgery, having someone wash your back for you may be helpful. Wait 2 minutes after CHG soap is applied, then you may rinse off the CHG soap.  Pat dry with a clean towel  Put on clean pajamas    Additional instructions for the day of surgery: DO NOT APPLY any lotions, deodorants, cologne, or perfumes.   Do not wear jewelry or makeup Do not wear nail polish, gel polish, artificial nails, or any other type of covering on natural nails (fingers and toes) Do not bring valuables to the hospital. [facility] is not responsible for valuables/personal belongings. Put on clean/comfortable clothes.  Please brush your teeth.  Ask your nurse before applying any prescription medications to the skin.

## 2023-08-12 ENCOUNTER — Other Ambulatory Visit: Payer: Self-pay

## 2023-08-12 ENCOUNTER — Encounter (HOSPITAL_COMMUNITY): Payer: Self-pay

## 2023-08-12 ENCOUNTER — Inpatient Hospital Stay (HOSPITAL_COMMUNITY)
Admission: RE | Admit: 2023-08-12 | Discharge: 2023-08-12 | Disposition: A | Discharge status: Home / Self Care | Payer: Medicaid Other | Source: Ambulatory Visit | Attending: General Surgery

## 2023-08-12 VITALS — BP 131/91 | HR 90 | Temp 98.5°F | Resp 17 | Ht 67.0 in | Wt 289.0 lb

## 2023-08-12 DIAGNOSIS — Z01818 Encounter for other preprocedural examination: Secondary | ICD-10-CM

## 2023-08-12 DIAGNOSIS — Z01812 Encounter for preprocedural laboratory examination: Secondary | ICD-10-CM | POA: Insufficient documentation

## 2023-08-12 DIAGNOSIS — N390 Urinary tract infection, site not specified: Secondary | ICD-10-CM | POA: Diagnosis not present

## 2023-08-12 LAB — BASIC METABOLIC PANEL
Anion gap: 6 (ref 5–15)
BUN: 7 mg/dL (ref 6–20)
CO2: 23 mmol/L (ref 22–32)
Calcium: 8.9 mg/dL (ref 8.9–10.3)
Chloride: 108 mmol/L (ref 98–111)
Creatinine, Ser: 0.59 mg/dL (ref 0.44–1.00)
GFR, Estimated: >60 mL/min (ref >60)
Glucose, Bld: 97 mg/dL (ref 70–99)
Potassium: 3.7 mmol/L (ref 3.5–5.1)
Sodium: 137 mmol/L (ref 135–145)

## 2023-08-12 LAB — CBC
HCT: 33.2 % — ABNORMAL LOW (ref 36.0–46.0)
Hemoglobin: 10.4 g/dL — ABNORMAL LOW (ref 12.0–15.0)
MCH: 26.3 pg (ref 26.0–34.0)
MCHC: 31.3 g/dL (ref 30.0–36.0)
MCV: 83.8 fL (ref 80.0–100.0)
Platelets: 372 10*3/uL (ref 150–400)
RBC: 3.96 MIL/uL (ref 3.87–5.11)
RDW: 15.4 % (ref 11.5–15.5)
WBC: 7.4 10*3/uL (ref 4.0–10.5)
nRBC: 0 % (ref 0.0–0.2)

## 2023-08-12 NOTE — Progress Notes (Signed)
PCP - Norva Riffle, PA Cardiologist - denies  PPM/ICD - denies   Chest x-ray - 12/25/20 EKG - 12/25/20 (n/a) Stress Test - denies ECHO - denies Cardiac Cath - denies  Sleep Study - denies   DM- denies  Last dose of GLP1 agonist-  n/a   ASA/Blood Thinner Instructions: n/a   ERAS Protcol - yes PRE-SURGERY Ensure given at PAT  COVID TEST- n/a   Anesthesia review: no  Patient denies shortness of breath, fever, cough and chest pain at PAT appointment   All instructions explained to the patient, with a verbal understanding of the material. Patient agrees to go over the instructions while at home for a better understanding. The opportunity to ask questions was provided.

## 2023-08-12 NOTE — Progress Notes (Signed)
Surgical Instructions   Your procedure is scheduled on Monday, January 6th, 2025. Report to [facility] Main Entrance "A" at 7:30 A.M., then check in with the Admitting office. Any questions or running late day of surgery: call [phone]  Questions prior to your surgery date: call [phone], Monday-Friday, 8am-4pm. If you experience any cold or flu symptoms such as cough, fever, chills, shortness of breath, etc. between now and your scheduled surgery, please notify us at the above number.     Remember:  Do not eat after midnight the night before your surgery   You may drink clear liquids until 6:30 the morning of your surgery.    Clear liquids allowed are: Water, Non-Citrus Juices (without pulp), Carbonated Beverages, Clear Tea (no milk, honey, etc.), Black Coffee Only (NO MILK, CREAM OR POWDERED CREAMER of any kind), and Gatorade.  Patient Instructions  The night before surgery:  No food after midnight. ONLY clear liquids after midnight  The day of surgery (if you do NOT have diabetes):  Drink ONE (1) Pre-Surgery Clear Ensure by 06:30 AM the morning of surgery. Drink in one sitting. Do not sip.  This drink was given to you during your hospital  pre-op appointment visit.  Nothing else to drink after completing the  Pre-Surgery Clear Ensure.          If you have questions, please contact your surgeon's office.    Take these medicines the morning of surgery with A SIP OF WATER:  None   May take these medicines IF NEEDED: Acetaminophen (Tylenol)    One week prior to surgery, STOP taking any Aspirin (unless otherwise instructed by your surgeon) Aleve, Naproxen, Ibuprofen, Motrin, Advil, Goody's, BC's, all herbal medications, fish oil, and non-prescription vitamins.                     Do NOT Smoke (Tobacco/Vaping) for 24 hours prior to your procedure.  If you use a CPAP at night, you may bring your mask/headgear for your overnight stay.   You will be asked to remove  any contacts, glasses, piercing's, hearing aid's, dentures/partials prior to surgery. Please bring cases for these items if needed.    Patients discharged the day of surgery will not be allowed to drive home, and someone needs to stay with them for 24 hours.  SURGICAL WAITING ROOM VISITATION Patients may have no more than 2 support people in the waiting area - these visitors may rotate.   Pre-op nurse will coordinate an appropriate time for 1 ADULT support person, who may not rotate, to accompany patient in pre-op.  Children under the age of 15 must have an adult with them who is not the patient and must remain in the main waiting area with an adult.  If the patient needs to stay at the hospital during part of their recovery, the visitor guidelines for inpatient rooms apply.  Please refer to the [facility] website for the visitor guidelines for any additional information.   If you received a COVID test during your pre-op visit  it is requested that you wear a mask when out in public, stay away from anyone that may not be feeling well and notify your surgeon if you develop symptoms. If you have been in contact with anyone that has tested positive in the last 10 days please notify you surgeon.      Pre-operative CHG Bathing Instructions   You can play a key role in reducing the risk of infection  after surgery. Your skin needs to be as free of germs as possible. You can reduce the number of germs on your skin by washing with CHG (chlorhexidine gluconate) soap before surgery. CHG is an antiseptic soap that kills germs and continues to kill germs even after washing.   DO NOT use if you have an allergy to chlorhexidine/CHG or antibacterial soaps. If your skin becomes reddened or irritated, stop using the CHG and notify one of our RNs at [phone].              TAKE A SHOWER THE NIGHT BEFORE SURGERY AND THE DAY OF SURGERY    Please keep in mind the following:  DO NOT shave, including legs  and underarms, 48 hours prior to surgery.   You may shave your face before/day of surgery.  Place clean sheets on your bed the night before surgery Use a clean washcloth (not used since being washed) for each shower. DO NOT sleep with pet's night before surgery.  CHG Shower Instructions:  Wash your face and private area with normal soap. If you choose to wash your hair, wash first with your normal shampoo.  After you use shampoo/soap, rinse your hair and body thoroughly to remove shampoo/soap residue.  Turn the water OFF and apply half the bottle of CHG soap to a CLEAN washcloth.  Apply CHG soap ONLY FROM YOUR NECK DOWN TO YOUR TOES (washing for 3-5 minutes)  DO NOT use CHG soap on face, private areas, open wounds, or sores.  Pay special attention to the area where your surgery is being performed.  If you are having back surgery, having someone wash your back for you may be helpful. Wait 2 minutes after CHG soap is applied, then you may rinse off the CHG soap.  Pat dry with a clean towel  Put on clean pajamas    Additional instructions for the day of surgery: DO NOT APPLY any lotions, deodorants, cologne, or perfumes.   Do not wear jewelry or makeup Do not wear nail polish, gel polish, artificial nails, or any other type of covering on natural nails (fingers and toes) Do not bring valuables to the hospital. [facility] is not responsible for valuables/personal belongings. Put on clean/comfortable clothes.  Please brush your teeth.  Ask your nurse before applying any prescription medications to the skin.

## 2023-08-18 NOTE — Anesthesia Preprocedure Evaluation (Addendum)
 Anesthesia Evaluation  Patient identified by MRN, date of birth, ID band Patient awake    Reviewed: Allergy & Precautions, NPO status , Patient's Chart, lab work & pertinent test results  Airway Mallampati: I  TM Distance: >3 FB Neck ROM: Full    Dental  (+) Dental Advisory Given, Chipped,    Pulmonary former smoker   Pulmonary exam normal breath sounds clear to auscultation       Cardiovascular negative cardio ROS Normal cardiovascular exam Rhythm:Regular Rate:Normal     Neuro/Psych negative neurological ROS  negative psych ROS   GI/Hepatic negative GI ROS, Neg liver ROS,,,  Endo/Other    Class 3 obesity (BMI 45)  Renal/GU negative Renal ROS  negative genitourinary   Musculoskeletal negative musculoskeletal ROS (+)    Abdominal   Peds  Hematology negative hematology ROS (+)   Anesthesia Other Findings   Reproductive/Obstetrics                             Anesthesia Physical Anesthesia Plan  ASA: 2  Anesthesia Plan: General   Post-op Pain Management: Tylenol  PO (pre-op)*   Induction: Intravenous  PONV Risk Score and Plan: 3 and Midazolam , Dexamethasone  and Ondansetron   Airway Management Planned: Oral ETT  Additional Equipment:   Intra-op Plan:   Post-operative Plan: Extubation in OR  Informed Consent: I have reviewed the patients History and Physical, chart, labs and discussed the procedure including the risks, benefits and alternatives for the proposed anesthesia with the patient or authorized representative who has indicated his/her understanding and acceptance.     Dental advisory given  Plan Discussed with: CRNA  Anesthesia Plan Comments:        Anesthesia Quick Evaluation

## 2023-08-19 ENCOUNTER — Other Ambulatory Visit: Payer: Self-pay

## 2023-08-19 ENCOUNTER — Other Ambulatory Visit (HOSPITAL_COMMUNITY): Payer: Self-pay

## 2023-08-19 ENCOUNTER — Ambulatory Visit (HOSPITAL_COMMUNITY): Payer: Self-pay | Admitting: Anesthesiology

## 2023-08-19 ENCOUNTER — Encounter (HOSPITAL_COMMUNITY)
Admission: RE | Disposition: A | Discharge status: Home / Self Care | Payer: Self-pay | Source: Home / Self Care | Attending: General Surgery

## 2023-08-19 ENCOUNTER — Ambulatory Visit (HOSPITAL_COMMUNITY)
Admission: RE | Admit: 2023-08-19 | Discharge: 2023-08-19 | Disposition: A | Discharge status: Home / Self Care | Payer: Medicaid Other | Attending: General Surgery | Admitting: General Surgery

## 2023-08-19 ENCOUNTER — Encounter (HOSPITAL_COMMUNITY): Payer: Self-pay | Admitting: General Surgery

## 2023-08-19 ENCOUNTER — Ambulatory Visit (HOSPITAL_BASED_OUTPATIENT_CLINIC_OR_DEPARTMENT_OTHER): Payer: Medicaid Other | Admitting: Anesthesiology

## 2023-08-19 DIAGNOSIS — K8020 Calculus of gallbladder without cholecystitis without obstruction: Secondary | ICD-10-CM | POA: Diagnosis present

## 2023-08-19 DIAGNOSIS — Z87891 Personal history of nicotine dependence: Secondary | ICD-10-CM | POA: Insufficient documentation

## 2023-08-19 DIAGNOSIS — K8010 Calculus of gallbladder with chronic cholecystitis without obstruction: Secondary | ICD-10-CM | POA: Diagnosis not present

## 2023-08-19 DIAGNOSIS — Z01818 Encounter for other preprocedural examination: Secondary | ICD-10-CM

## 2023-08-19 HISTORY — PX: CHOLECYSTECTOMY: SHX55

## 2023-08-19 LAB — POCT PREGNANCY, URINE: Preg Test, Ur: NEGATIVE

## 2023-08-19 SURGERY — LAPAROSCOPIC CHOLECYSTECTOMY
Anesthesia: General

## 2023-08-19 MED ORDER — LIDOCAINE 2% (20 MG/ML) 5 ML SYRINGE
INTRAMUSCULAR | Status: DC | PRN
  Administered 2023-08-19: 100 mg via INTRAVENOUS

## 2023-08-19 MED ORDER — BUPIVACAINE-EPINEPHRINE 0.25% -1:200000 IJ SOLN
INTRAMUSCULAR | Status: DC | PRN
  Administered 2023-08-19: 22 mL

## 2023-08-19 MED ORDER — OXYCODONE HCL 5 MG PO TABS
ORAL_TABLET | ORAL | Status: AC
  Filled 2023-08-19: qty 1

## 2023-08-19 MED ORDER — ACETAMINOPHEN 500 MG PO TABS: 1000.0000 mg | ORAL_TABLET | Freq: Once | ORAL | Status: DC

## 2023-08-19 MED ORDER — PROPOFOL 10 MG/ML IV BOLUS
INTRAVENOUS | Status: AC
  Filled 2023-08-19: qty 20

## 2023-08-19 MED ORDER — SUGAMMADEX SODIUM 200 MG/2ML IV SOLN
INTRAVENOUS | Status: DC | PRN
  Administered 2023-08-19 (×2): 200 mg via INTRAVENOUS

## 2023-08-19 MED ORDER — ONDANSETRON HCL 4 MG/2ML IJ SOLN
INTRAMUSCULAR | Status: AC
  Filled 2023-08-19: qty 2

## 2023-08-19 MED ORDER — OXYCODONE HCL 5 MG PO TABS
5.0000 mg | ORAL_TABLET | Freq: Once | ORAL | Status: AC
  Administered 2023-08-19: 5 mg via ORAL

## 2023-08-19 MED ORDER — CHLORHEXIDINE GLUCONATE 0.12 % MT SOLN
15.0000 mL | Freq: Once | OROMUCOSAL | Status: AC
  Administered 2023-08-19: 15 mL via OROMUCOSAL
  Filled 2023-08-19: qty 15

## 2023-08-19 MED ORDER — FENTANYL CITRATE (PF) 100 MCG/2ML IJ SOLN
INTRAMUSCULAR | Status: AC
  Filled 2023-08-19: qty 2

## 2023-08-19 MED ORDER — CELECOXIB 200 MG PO CAPS
400.0000 mg | ORAL_CAPSULE | ORAL | Status: AC
  Administered 2023-08-19: 400 mg via ORAL
  Filled 2023-08-19: qty 2

## 2023-08-19 MED ORDER — CHLORHEXIDINE GLUCONATE CLOTH 2 % EX PADS: 6.0000 | MEDICATED_PAD | Freq: Once | CUTANEOUS | Status: DC

## 2023-08-19 MED ORDER — DEXMEDETOMIDINE HCL IN NACL 80 MCG/20ML IV SOLN
INTRAVENOUS | Status: DC | PRN
Start: 2023-08-19 — End: 2023-08-19
  Administered 2023-08-19: 8 ug via INTRAVENOUS

## 2023-08-19 MED ORDER — PHENYLEPHRINE 80 MCG/ML (10ML) SYRINGE FOR IV PUSH (FOR BLOOD PRESSURE SUPPORT)
PREFILLED_SYRINGE | INTRAVENOUS | Status: DC | PRN
  Administered 2023-08-19: 80 ug via INTRAVENOUS

## 2023-08-19 MED ORDER — CIPROFLOXACIN IN D5W 400 MG/200ML IV SOLN
400.0000 mg | INTRAVENOUS | Status: AC
  Administered 2023-08-19: 400 mg via INTRAVENOUS
  Filled 2023-08-19: qty 200

## 2023-08-19 MED ORDER — OXYCODONE HCL 5 MG PO TABS
5.0000 mg | ORAL_TABLET | Freq: Four times a day (QID) | ORAL | 0 refills | Status: AC | PRN
  Filled 2023-08-19: qty 20, 5d supply, fill #0

## 2023-08-19 MED ORDER — ONDANSETRON HCL 4 MG/2ML IJ SOLN
INTRAMUSCULAR | Status: DC | PRN
  Administered 2023-08-19: 4 mg via INTRAVENOUS

## 2023-08-19 MED ORDER — LACTATED RINGERS IV SOLN: INTRAVENOUS | Status: DC | PRN

## 2023-08-19 MED ORDER — ENSURE PRE-SURGERY PO LIQD: 296.0000 mL | Freq: Once | ORAL | Status: DC

## 2023-08-19 MED ORDER — LACTATED RINGERS IV SOLN: INTRAVENOUS | Status: DC

## 2023-08-19 MED ORDER — BUPIVACAINE-EPINEPHRINE (PF) 0.25% -1:200000 IJ SOLN
INTRAMUSCULAR | Status: AC
  Filled 2023-08-19: qty 30

## 2023-08-19 MED ORDER — GABAPENTIN 300 MG PO CAPS
300.0000 mg | ORAL_CAPSULE | ORAL | Status: AC
  Administered 2023-08-19: 300 mg via ORAL
  Filled 2023-08-19: qty 1

## 2023-08-19 MED ORDER — ORAL CARE MOUTH RINSE: 15.0000 mL | Freq: Once | OROMUCOSAL | Status: AC

## 2023-08-19 MED ORDER — 0.9 % SODIUM CHLORIDE (POUR BTL) OPTIME
TOPICAL | Status: DC | PRN
  Administered 2023-08-19: 1000 mL

## 2023-08-19 MED ORDER — PROPOFOL 10 MG/ML IV BOLUS
INTRAVENOUS | Status: DC | PRN
  Administered 2023-08-19: 200 mg via INTRAVENOUS

## 2023-08-19 MED ORDER — SODIUM CHLORIDE 0.9 % IR SOLN
Status: DC | PRN
  Administered 2023-08-19: 1000 mL

## 2023-08-19 MED ORDER — DEXAMETHASONE SODIUM PHOSPHATE 10 MG/ML IJ SOLN
INTRAMUSCULAR | Status: DC | PRN
  Administered 2023-08-19: 10 mg via INTRAVENOUS

## 2023-08-19 MED ORDER — SCOPOLAMINE 1 MG/3DAYS TD PT72
1.0000 | MEDICATED_PATCH | TRANSDERMAL | Status: DC
  Administered 2023-08-19: 1.5 mg via TRANSDERMAL
  Filled 2023-08-19: qty 1

## 2023-08-19 MED ORDER — ROCURONIUM BROMIDE 10 MG/ML (PF) SYRINGE
PREFILLED_SYRINGE | INTRAVENOUS | Status: DC | PRN
  Administered 2023-08-19: 50 mg via INTRAVENOUS
  Administered 2023-08-19: 20 mg via INTRAVENOUS

## 2023-08-19 MED ORDER — ROCURONIUM BROMIDE 10 MG/ML (PF) SYRINGE
PREFILLED_SYRINGE | INTRAVENOUS | Status: AC
  Filled 2023-08-19: qty 10

## 2023-08-19 MED ORDER — FENTANYL CITRATE (PF) 250 MCG/5ML IJ SOLN
INTRAMUSCULAR | Status: AC
  Filled 2023-08-19: qty 5

## 2023-08-19 MED ORDER — DEXAMETHASONE SODIUM PHOSPHATE 10 MG/ML IJ SOLN
INTRAMUSCULAR | Status: AC
  Filled 2023-08-19: qty 1

## 2023-08-19 MED ORDER — ACETAMINOPHEN 500 MG PO TABS
1000.0000 mg | ORAL_TABLET | ORAL | Status: AC
  Administered 2023-08-19: 1000 mg via ORAL
  Filled 2023-08-19: qty 2

## 2023-08-19 MED ORDER — FENTANYL CITRATE (PF) 100 MCG/2ML IJ SOLN
25.0000 ug | INTRAMUSCULAR | Status: DC | PRN
  Administered 2023-08-19 (×3): 50 ug via INTRAVENOUS

## 2023-08-19 MED ORDER — LIDOCAINE 2% (20 MG/ML) 5 ML SYRINGE
INTRAMUSCULAR | Status: AC
  Filled 2023-08-19: qty 5

## 2023-08-19 MED ORDER — FENTANYL CITRATE (PF) 250 MCG/5ML IJ SOLN
INTRAMUSCULAR | Status: DC | PRN
  Administered 2023-08-19: 50 ug via INTRAVENOUS
  Administered 2023-08-19: 100 ug via INTRAVENOUS
  Administered 2023-08-19: 50 ug via INTRAVENOUS

## 2023-08-19 SURGICAL SUPPLY — 38 items
APPLIER CLIP 5 13 M/L LIGAMAX5 (MISCELLANEOUS) ×1
BAG COUNTER SPONGE SURGICOUNT (BAG) ×1 IMPLANT
BLADE CLIPPER SURG (BLADE) IMPLANT
CANISTER SUCT 3000ML PPV (MISCELLANEOUS) ×1 IMPLANT
CHLORAPREP W/TINT 26 (MISCELLANEOUS) ×1 IMPLANT
CLIP APPLIE 5 13 M/L LIGAMAX5 (MISCELLANEOUS) ×1 IMPLANT
COVER SURGICAL LIGHT HANDLE (MISCELLANEOUS) ×1 IMPLANT
DERMABOND ADVANCED .7 DNX12 (GAUZE/BANDAGES/DRESSINGS) ×1 IMPLANT
ELECT REM PT RETURN 9FT ADLT (ELECTROSURGICAL) ×1
ELECTRODE REM PT RTRN 9FT ADLT (ELECTROSURGICAL) ×1 IMPLANT
GLOVE BIO SURGEON STRL SZ8 (GLOVE) ×1 IMPLANT
GLOVE BIOGEL PI IND STRL 8 (GLOVE) ×1 IMPLANT
GOWN STRL REUS W/ TWL LRG LVL3 (GOWN DISPOSABLE) ×2 IMPLANT
GOWN STRL REUS W/ TWL XL LVL3 (GOWN DISPOSABLE) ×1 IMPLANT
IRRIG SUCT STRYKERFLOW 2 WTIP (MISCELLANEOUS) ×1
IRRIGATION SUCT STRKRFLW 2 WTP (MISCELLANEOUS) ×1 IMPLANT
KIT BASIN OR (CUSTOM PROCEDURE TRAY) ×1 IMPLANT
KIT TURNOVER KIT B (KITS) ×1 IMPLANT
L-HOOK LAP DISP 36CM (ELECTROSURGICAL) ×1
LHOOK LAP DISP 36CM (ELECTROSURGICAL) ×1 IMPLANT
NDL 22X1.5 STRL (OR ONLY) (MISCELLANEOUS) ×1 IMPLANT
NEEDLE 22X1.5 STRL (OR ONLY) (MISCELLANEOUS) ×1
NS IRRIG 1000ML POUR BTL (IV SOLUTION) ×1 IMPLANT
PAD ARMBOARD 7.5X6 YLW CONV (MISCELLANEOUS) ×1 IMPLANT
PENCIL BUTTON HOLSTER BLD 10FT (ELECTRODE) ×1 IMPLANT
POUCH RETRIEVAL ECOSAC 10 (ENDOMECHANICALS) ×1 IMPLANT
SCISSORS LAP 5X35 DISP (ENDOMECHANICALS) ×1 IMPLANT
SET TUBE SMOKE EVAC HIGH FLOW (TUBING) ×1 IMPLANT
SLEEVE Z-THREAD 5X100MM (TROCAR) ×2 IMPLANT
SPECIMEN JAR SMALL (MISCELLANEOUS) ×1 IMPLANT
SUT VIC AB 4-0 PS2 27 (SUTURE) ×1 IMPLANT
TOWEL GREEN STERILE (TOWEL DISPOSABLE) ×1 IMPLANT
TOWEL GREEN STERILE FF (TOWEL DISPOSABLE) ×1 IMPLANT
TRAY LAPAROSCOPIC MC (CUSTOM PROCEDURE TRAY) ×1 IMPLANT
TROCAR BALLN 12MMX100 BLUNT (TROCAR) ×1 IMPLANT
TROCAR Z-THREAD OPTICAL 5X100M (TROCAR) ×1 IMPLANT
WARMER LAPAROSCOPE (MISCELLANEOUS) ×1 IMPLANT
WATER STERILE IRR 1000ML POUR (IV SOLUTION) ×1 IMPLANT

## 2023-08-19 NOTE — H&P (Signed)
 Jasmine Hudson is an 33 y.o. female.   Chief Complaint: RUQ pain HPI: Presents for laparoscopic cholecystectomy.  She has had some additional attacks of biliary colic since I saw her in the office.  She is otherwise feeling well.  Past Medical History:  Diagnosis Date   Herpes    Trichomoniasis 09/18/2013   09/18/13 Phone number disconnected > requested letter to be sent or inform at next visit     Past Surgical History:  Procedure Laterality Date   CESAREAN SECTION  2007   CESAREAN SECTION  02/29/2012   Procedure: CESAREAN SECTION;  Surgeon: Norleen LULLA Server, MD;  Location: [facility] OR[facility];  Service: Gynecology;  Laterality: N/A;  Repeat    CESAREAN SECTION N/A 10/23/2013   Procedure: CESAREAN SECTION;  Surgeon: Glenys GORMAN Birk, MD;  Location: [facility] OR[facility];  Service: Obstetrics;  Laterality: N/A;  Small abrasion located on the left lower lateral of the abdomen.  Dermabond applied to the abrasion by Dr. Almarie.  See OR note.   CESAREAN SECTION WITH BILATERAL TUBAL LIGATION Bilateral 02/08/2018   Procedure: REPEAT CESAREAN SECTION WITH BILATERAL TUBAL LIGATION;  Surgeon: Birk Glenys GORMAN, MD;  Location: [facility];  Service: Obstetrics;  Laterality: Bilateral;   INDUCED ABORTION      Family History  Problem Relation Age of Onset   Hypertension Mother    Hypertension Maternal Grandfather    Social History:  reports that she quit smoking about 13 years ago. Her smoking use included cigars. She has never used smokeless tobacco. She reports that she does not currently use alcohol. She reports that she does not use drugs.  Allergies:  Allergies  Allergen Reactions   Zosyn  [Piperacillin -Tazobactam In Dex] Hives    Hives within 30 minutes of receiving IV zosyn  infusion   Apple Juice Itching and Swelling    Red Apples   Banana Itching and Swelling    Medications Prior to Admission  Medication Sig Dispense Refill   acetaminophen  (TYLENOL ) 500 MG tablet Take 1,000 mg by mouth every 6 (six) hours as  needed (pain.).     Aspirin-Salicylamide-Caffeine (BC FAST PAIN RELIEF) 650-195-33.3 MG PACK Take 1 packet by mouth daily as needed (pain.).     ibuprofen  (ADVIL ) 200 MG tablet Take 200 mg by mouth daily as needed (pain.).     naproxen sodium (ALEVE) 220 MG tablet Take 440 mg by mouth 2 (two) times daily as needed.      No results found for this or any previous visit (from the past 48 hours). No results found.  Review of Systems  Blood pressure 130/69, pulse 78, temperature 98.9 F (37.2 C), temperature source Oral, resp. rate 18, height 5' 7 (1.702 m), weight 131.1 kg, last menstrual period 07/22/2023, SpO2 99%, unknown if currently breastfeeding. Physical Exam HENT:     Head: Normocephalic.  Eyes:     Pupils: Pupils are equal, round, and reactive to light.  Cardiovascular:     Rate and Rhythm: Normal rate and regular rhythm.     Pulses: Normal pulses.  Pulmonary:     Effort: Pulmonary effort is normal.     Breath sounds: Normal breath sounds.  Abdominal:     General: Abdomen is flat.     Palpations: Abdomen is soft.     Tenderness: There is no abdominal tenderness.  Musculoskeletal:        General: Normal range of motion.  Skin:    General: Skin is warm.  Neurological:     Mental  Status: She is alert and oriented to person, place, and time.  Psychiatric:        Mood and Affect: Mood normal.      Assessment/Plan Symptomatic cholelithiasis -for laparoscopic cholecystectomy.  Procedure, risks, and benefits were again discussed with her.  I also discussed the expected postoperative course.  She is agreeable.  Dann FORBES Hummer, MD 08/19/2023, 8:38 AM

## 2023-08-19 NOTE — Transfer of Care (Signed)
 Immediate Anesthesia Transfer of Care Note  Patient: Jasmine Hudson  Procedure(s) Performed: LAPAROSCOPIC CHOLECYSTECTOMY  Patient Location: PACU  Anesthesia Type:General  Level of Consciousness: awake, alert , and oriented  Airway & Oxygen Therapy: Patient Spontanous Breathing and Patient connected to face mask oxygen  Post-op Assessment: Report given to RN, Post -op Vital signs reviewed and stable, Patient moving all extremities, and Patient able to stick tongue midline  Post vital signs: Reviewed and stable  Last Vitals:  Vitals Value Taken Time  BP 131/71 08/19/23 1030  Temp 36.5 C 08/19/23 1025  Pulse 80 08/19/23 1034  Resp 16 08/19/23 1034  SpO2 95 % 08/19/23 1034  Vitals shown include unfiled device data.  Last Pain:  Vitals:   08/19/23 1025  TempSrc:   PainSc: Asleep      Patients Stated Pain Goal: 0 (08/19/23 0830)  Complications: There were no known notable events for this encounter.

## 2023-08-19 NOTE — Op Note (Signed)
  08/19/2023  10:06 AM  PATIENT:  Jasmine Hudson  33 y.o. female  PRE-OPERATIVE DIAGNOSIS:  SYMPTOMATIC CHOLELITHIASIS  POST-OPERATIVE DIAGNOSIS:  SYMPTOMATIC CHOLELITHIASIS  PROCEDURE:  Procedure(s): LAPAROSCOPIC CHOLECYSTECTOMY  SURGEON:  Surgeon(s): Sebastian Moles, MD  ASSISTANTS: Powell Seats, RNFA   ANESTHESIA:   local and general  EBL:  Total I/O In: 450 [I.V.:250; IV Piggyback:200] Out: -   BLOOD ADMINISTERED:none  DRAINS: none   SPECIMEN:  Excision  DISPOSITION OF SPECIMEN:  PATHOLOGY  COUNTS:  YES  DICTATION: .Dragon Dictation Procedure in detail: Informed consent was obtained.  She received intravenous antibiotics.  She was brought to the operating room and general endotracheal anesthesia was administered by the anesthesia staff.  Her abdomen was prepped and draped in a sterile fashion.  We did a timeout procedure.The infraumbilical region was infiltrated with local. Infraumbilical incision was made. Subcutaneous tissues were dissected down revealing the anterior fascia. This was divided sharply along the midline. Peritoneal cavity was entered under direct vision without complication. A 0 Vicryl pursestring was placed around the fascial opening. Hassan trocar was inserted into the abdomen. The abdomen was insufflated with carbon dioxide in standard fashion. Under direct vision a 5 mm epigastric and 5 mm right abdominal port x 2 were placed.  Local was used at each port site.  Laparoscopic exploration revealed a distended gallbladder with multiple stones.  The dome was retracted superior and medially.  The infundibulum was retracted inferior and laterally.  Dissection began laterally and progressed medially.  We divided the cystic duct.  It was circumferentially dissected until we had a critical view of safety.  The common bile duct was visible and protected.  3 clips were placed proximally on the cystic duct and 1 was placed distally and it was divided.  Further  dissection revealed the cystic artery.  This was clipped twice proximally and once distally.  It was divided.  The gallbladder was taken off the liver bed using cautery.  It was placed in a bag and removed from the abdomen.  It was sent to pathology.  The liver bed was cauterized to get excellent hemostasis.  All clips remained in good position.  There was no bleeding.  The area was copiously irrigated and the irrigation fluid was evacuated.  The liver bed was dry.  Ports were removed under direct vision.  Pneumoperitoneum was released.  Infraumbilical fascia was closed by tying the pursestring.  All 4 wounds were irrigated and the skin of each was closed with 4-0 Vicryl followed by Dermabond.  All counts were correct.  She tolerated the procedure well without apparent complication and was taken recovery in stable condition.  PATIENT DISPOSITION:  PACU - hemodynamically stable.   Delay start of Pharmacological VTE agent (>24hrs) due to surgical blood loss or risk of bleeding:  no  Moles Sebastian, MD, MPH, FACS Pager: [phone]  1/6/202510:06 AM

## 2023-08-19 NOTE — Anesthesia Procedure Notes (Signed)
 Procedure Name: Intubation Date/Time: 08/19/2023 9:28 AM  Performed by: Loreda Rodriguez, CRNAPre-anesthesia Checklist: Patient identified, Emergency Drugs available, Suction available and Patient being monitored Patient Re-evaluated:Patient Re-evaluated prior to induction Oxygen Delivery Method: Circle System Utilized Preoxygenation: Pre-oxygenation with 100% oxygen Induction Type: IV induction Ventilation: Mask ventilation without difficulty Laryngoscope Size: Glidescope and 3 Grade View: Grade I Tube type: Oral Tube size: 7.0 mm Number of attempts: 1 Airway Equipment and Method: Stylet and Oral airway Placement Confirmation: ETT inserted through vocal cords under direct vision, positive ETCO2 and breath sounds checked- equal and bilateral Secured at: 22 cm Tube secured with: Tape Dental Injury: Teeth and Oropharynx as per pre-operative assessment

## 2023-08-20 ENCOUNTER — Encounter (HOSPITAL_COMMUNITY): Payer: Self-pay | Admitting: General Surgery

## 2023-08-20 LAB — SURGICAL PATHOLOGY

## 2023-08-20 NOTE — Anesthesia Postprocedure Evaluation (Signed)
 Anesthesia Post Note  Patient: Jasmine Hudson  Procedure(s) Performed: LAPAROSCOPIC CHOLECYSTECTOMY     Patient location during evaluation: PACU Anesthesia Type: General Level of consciousness: awake and alert Pain management: pain level controlled Vital Signs Assessment: post-procedure vital signs reviewed and stable Respiratory status: spontaneous breathing, nonlabored ventilation, respiratory function stable and patient connected to nasal cannula oxygen Cardiovascular status: blood pressure returned to baseline and stable Postop Assessment: no apparent nausea or vomiting Anesthetic complications: no  There were no known notable events for this encounter.  Last Vitals:  Vitals:   08/19/23 1100 08/19/23 1115  BP: 131/82 125/77  Pulse: 72 67  Resp: 13 15  Temp:    SpO2: 93% 97%    Last Pain:  Vitals:   08/19/23 1115  TempSrc:   PainSc: Asleep                 Arisbeth Purrington L Dorinda Stehr

## 2023-09-13 ENCOUNTER — Encounter (HOSPITAL_BASED_OUTPATIENT_CLINIC_OR_DEPARTMENT_OTHER): Payer: Self-pay

## 2023-09-13 ENCOUNTER — Other Ambulatory Visit: Payer: Self-pay

## 2023-09-13 DIAGNOSIS — Z20822 Contact with and (suspected) exposure to covid-19: Secondary | ICD-10-CM | POA: Insufficient documentation

## 2023-09-13 DIAGNOSIS — J09X2 Influenza due to identified novel influenza A virus with other respiratory manifestations: Secondary | ICD-10-CM | POA: Insufficient documentation

## 2023-09-13 DIAGNOSIS — R509 Fever, unspecified: Secondary | ICD-10-CM | POA: Diagnosis present

## 2023-09-13 LAB — GROUP A STREP BY PCR: Group A Strep by PCR: NOT DETECTED

## 2023-09-13 MED ORDER — IBUPROFEN 800 MG PO TABS
800.0000 mg | ORAL_TABLET | Freq: Once | ORAL | Status: AC
  Administered 2023-09-13: 800 mg via ORAL
  Filled 2023-09-13: qty 1

## 2023-09-13 MED ORDER — ONDANSETRON 4 MG PO TBDP
4.0000 mg | ORAL_TABLET | Freq: Once | ORAL | Status: AC
  Administered 2023-09-13: 4 mg via ORAL
  Filled 2023-09-13: qty 1

## 2023-09-13 NOTE — ED Triage Notes (Signed)
Pt POV from home d/t flu like s/s - fever, cough, sore throat, body aches for 2 days.  Pt states everyone in house has same s/s.

## 2023-09-14 ENCOUNTER — Emergency Department (HOSPITAL_BASED_OUTPATIENT_CLINIC_OR_DEPARTMENT_OTHER)
Admission: EM | Admit: 2023-09-14 | Discharge: 2023-09-14 | Disposition: A | Discharge status: Home / Self Care | Payer: Medicaid Other | Attending: Emergency Medicine | Admitting: Emergency Medicine

## 2023-09-14 DIAGNOSIS — J101 Influenza due to other identified influenza virus with other respiratory manifestations: Secondary | ICD-10-CM

## 2023-09-14 LAB — RESP PANEL BY RT-PCR (RSV, FLU A&B, COVID)  RVPGX2
Influenza A by PCR: POSITIVE — AB
Influenza B by PCR: NEGATIVE
Resp Syncytial Virus by PCR: NEGATIVE
SARS Coronavirus 2 by RT PCR: NEGATIVE

## 2023-09-14 MED ORDER — ACETAMINOPHEN 500 MG PO TABS
1000.0000 mg | ORAL_TABLET | Freq: Once | ORAL | Status: AC
  Administered 2023-09-14: 1000 mg via ORAL
  Filled 2023-09-14: qty 2

## 2023-09-14 MED ORDER — IBUPROFEN 800 MG PO TABS: 800.0000 mg | ORAL_TABLET | Freq: Once | ORAL | Status: DC

## 2023-09-14 NOTE — ED Provider Notes (Signed)
[place] EMERGENCY DEPARTMENT AT [facility] Provider Note   CSN: [id number] Arrival date & time: 09/13/23  2257     History  Chief Complaint  Patient presents with   Generalized Body Aches    Jasmine Hudson is a 33 y.o. female.  The history is provided by the patient.  Influenza Presenting symptoms: cough, fever and myalgias   Presenting symptoms: no diarrhea, no nausea and no vomiting   Presenting symptoms comment:  Congestion  Severity:  Moderate Onset quality:  Gradual Duration:  5 days Progression:  Unchanged Chronicity:  New Relieved by:  Nothing Worsened by:  Nothing Ineffective treatments: nyquil. Associated symptoms: nasal congestion   Risk factors: not elderly, no diabetes problem and no heart disease        Home Medications Prior to Admission medications   Medication Sig Start Date End Date Taking? Authorizing Provider  acetaminophen (TYLENOL) 500 MG tablet Take 1,000 mg by mouth every 6 (six) hours as needed (pain.).    [provider]  Aspirin-Salicylamide-Caffeine (BC FAST PAIN RELIEF) 650-195-33.3 MG PACK Take 1 packet by mouth daily as needed (pain.).    [provider]  ibuprofen (ADVIL) 200 MG tablet Take 200 mg by mouth daily as needed (pain.).    [provider]  naproxen sodium (ALEVE) 220 MG tablet Take 440 mg by mouth 2 (two) times daily as needed.    [provider]      Allergies    Zosyn [piperacillin-tazobactam in dex], Apple juice, and Banana    Review of Systems   Review of Systems  Constitutional:  Positive for fever.  HENT:  Positive for congestion.   Eyes:  Negative for redness.  Respiratory:  Positive for cough.   Gastrointestinal:  Negative for diarrhea, nausea and vomiting.  Musculoskeletal:  Positive for myalgias.  All other systems reviewed and are negative.   Physical Exam Updated Vital Signs BP (!) 120/90 (BP Location: Right Arm)   Pulse (!) 112   Temp (!) 100.6 F  (38.1 C)   Resp 18   Ht 5\' 7"  (1.702 m)   Wt 131.1 kg   LMP 08/18/2023   SpO2 98%   BMI 45.26 kg/m  Physical Exam Vitals and nursing note reviewed.  Constitutional:      General: She is not in acute distress.    Appearance: She is well-developed.  HENT:     Head: Normocephalic and atraumatic.     Nose: Congestion present.  Eyes:     Pupils: Pupils are equal, round, and reactive to light.  Cardiovascular:     Rate and Rhythm: Normal rate and regular rhythm.     Pulses: Normal pulses.     Heart sounds: Normal heart sounds.  Pulmonary:     Effort: Pulmonary effort is normal. No respiratory distress.     Breath sounds: Normal breath sounds.  Abdominal:     General: Bowel sounds are normal. There is no distension.     Palpations: Abdomen is soft.     Tenderness: There is no abdominal tenderness. There is no guarding or rebound.  Musculoskeletal:        General: Normal range of motion.     Cervical back: Neck supple.  Skin:    General: Skin is warm and dry.     Capillary Refill: Capillary refill takes less than 2 seconds.     Findings: No erythema or rash.  Neurological:     General: No focal deficit present.  Deep Tendon Reflexes: Reflexes normal.  Psychiatric:        Mood and Affect: Mood normal.     ED Results / Procedures / Treatments   Labs (all labs ordered are listed, but only abnormal results are displayed) Labs Reviewed  RESP PANEL BY RT-PCR (RSV, FLU A&B, COVID)  RVPGX2 - Abnormal; Notable for the following components:      Result Value   Influenza A by PCR POSITIVE (*)    All other components within normal limits  GROUP A STREP BY PCR    EKG None  Radiology No results found.  Procedures Procedures    Medications Ordered in ED Medications  acetaminophen (TYLENOL) tablet 1,000 mg (has no administration in time range)  ondansetron (ZOFRAN-ODT) disintegrating tablet 4 mg (4 mg Oral Given 09/13/23 2328)  ibuprofen (ADVIL) tablet 800 mg (800 mg  Oral Given 09/13/23 2328)    ED Course/ Medical Decision Making/ A&P                                 Medical Decision Making Flu like illness since Monday   Amount and/or Complexity of Data Reviewed External Data Reviewed: notes.    Details: Previous notes reviewed  Labs: ordered.    Details: Positive flu negative covid   Risk OTC drugs. Prescription drug management. Risk Details: Patient is well appearing.  Exam is benign and reassuring.  Stable for discharge.  Alternate tylenol and ibuprofen.      Final Clinical Impression(s) / ED Diagnoses Final diagnoses:  Influenza A   Return for intractable cough, coughing up blood, fevers > 100.4 unrelieved by medication, shortness of breath, intractable vomiting, chest pain, shortness of breath, weakness, numbness, changes in speech, facial asymmetry, abdominal pain, passing out, Inability to tolerate liquids or food, cough, altered mental status or any concerns. No signs of systemic illness or infection. The patient is nontoxic-appearing on exam and vital signs are within normal limits.  I have reviewed the triage vital signs and the nursing notes. Pertinent labs & imaging results that were available during my care of the patient were reviewed by me and considered in my medical decision making (see chart for details). After history, exam, and medical workup I feel the patient has been appropriately medically screened and is safe for discharge home. Pertinent diagnoses were discussed with the patient. Patient was given return precautions.  Rx / DC Orders ED Discharge Orders     None         Briawna Carver, MD 09/14/23 5621

## 2023-12-18 ENCOUNTER — Other Ambulatory Visit: Payer: Self-pay

## 2023-12-18 DIAGNOSIS — I872 Venous insufficiency (chronic) (peripheral): Secondary | ICD-10-CM

## 2023-12-27 ENCOUNTER — Encounter: Admitting: Vascular Surgery

## 2023-12-27 ENCOUNTER — Encounter (HOSPITAL_COMMUNITY)

## 2024-02-25 ENCOUNTER — Other Ambulatory Visit: Payer: Self-pay

## 2024-02-25 DIAGNOSIS — I872 Venous insufficiency (chronic) (peripheral): Secondary | ICD-10-CM

## 2024-03-04 NOTE — Progress Notes (Deleted)
 Office Note     CC:  *** Requesting Provider:  Rosalea Rosina SAILOR, PA  HPI: Jasmine Hudson is a 33 y.o. (October 29, 1990) female who presents at the request of Rosalea Rosina SAILOR, [place] for evaluation of ***.   Venous symptoms include: positive if (X) [  ] aching [  ] heavy [  ] tired  [  ] throbbing [  ] burning  [  ] itching [  ]swelling [  ] bleeding [  ] ulcer  Onset/duration:  ***  Occupation:  *** Aggravating factors: (sitting, standing) Alleviating factors: (elevation) Compression:  *** Helps:  *** Pain medications:  *** Previous vein procedures:  *** History of DVT:  ***   The pt *** on a statin for cholesterol management.  The pt *** on a daily aspirin.   Other AC:  *** The pt *** on *** for hypertension.   The pt *** diabetic.  *** Tobacco hx:  ***  Past Medical History:  Diagnosis Date   Herpes    Trichomoniasis 09/18/2013   09/18/13 Phone number disconnected > requested letter to be sent or inform at next visit     Past Surgical History:  Procedure Laterality Date   CESAREAN SECTION  2007   CESAREAN SECTION  02/29/2012   Procedure: CESAREAN SECTION;  Surgeon: Norleen LULLA Server, MD;  Location: [facility];  Service: Gynecology;  Laterality: N/A;  Repeat    CESAREAN SECTION N/A 10/23/2013   Procedure: CESAREAN SECTION;  Surgeon: Glenys GORMAN Birk, MD;  Location: [facility];  Service: Obstetrics;  Laterality: N/A;  Small abrasion located on the left lower lateral of the abdomen.  Dermabond applied to the abrasion by Dr. Almarie.  See OR note.   CESAREAN SECTION WITH BILATERAL TUBAL LIGATION Bilateral 02/08/2018   Procedure: REPEAT CESAREAN SECTION WITH BILATERAL TUBAL LIGATION;  Surgeon: Birk Glenys GORMAN, MD;  Location: [facility];  Service: Obstetrics;  Laterality: Bilateral;   CHOLECYSTECTOMY N/A 08/19/2023   Procedure: LAPAROSCOPIC CHOLECYSTECTOMY;  Surgeon: Sebastian Moles, MD;  Location: [facility] OR;  Service: General;  Laterality: N/A;   INDUCED ABORTION      Social  History   Socioeconomic History   Marital status: Single    Spouse name: Not on file   Number of children: 4   Years of education: Not on file   Highest education level: Not on file  Occupational History   Not on file  Tobacco Use   Smoking status: Former    Types: Cigars    Quit date: 08/13/2010    Years since quitting: 13.5   Smokeless tobacco: Never  Vaping Use   Vaping status: Never Used  Substance and Sexual Activity   Alcohol use: Not Currently   Drug use: No   Sexual activity: Not on file  Other Topics Concern   Not on file  Social History Narrative   Not on file   Social Drivers of Health   Financial Resource Strain: Not on file  Food Insecurity: Not on file  Transportation Needs: Not on file  Physical Activity: Not on file  Stress: Not on file  Social Connections: Not on file  Intimate Partner Violence: Not on file   *** Family History  Problem Relation Age of Onset   Hypertension Mother    Hypertension Maternal Grandfather     Current Outpatient Medications  Medication Sig Dispense Refill   acetaminophen  (TYLENOL ) 500 MG tablet Take 1,000 mg by mouth every 6 (six) hours as needed (pain.).  Aspirin-Salicylamide-Caffeine (BC FAST PAIN RELIEF) 650-195-33.3 MG PACK Take 1 packet by mouth daily as needed (pain.).     ibuprofen  (ADVIL ) 200 MG tablet Take 200 mg by mouth daily as needed (pain.).     naproxen sodium (ALEVE) 220 MG tablet Take 440 mg by mouth 2 (two) times daily as needed.     No current facility-administered medications for this visit.    Allergies  Allergen Reactions   Zosyn  [Piperacillin -Tazobactam In Dex] Hives    Hives within 30 minutes of receiving IV zosyn  infusion   Apple Juice Itching and Swelling    Red Apples   Banana Itching and Swelling     REVIEW OF SYSTEMS:  *** [X]  denotes positive finding, [ ]  denotes negative finding Cardiac  Comments:  Chest pain or chest pressure:    Shortness of breath upon exertion:     Short of breath when lying flat:    Irregular heart rhythm:        Vascular    Pain in calf, thigh, or hip brought on by ambulation:    Pain in feet at night that wakes you up from your sleep:     Blood clot in your veins:    Leg swelling:         Pulmonary    Oxygen at home:    Productive cough:     Wheezing:         Neurologic    Sudden weakness in arms or legs:     Sudden numbness in arms or legs:     Sudden onset of difficulty speaking or slurred speech:    Temporary loss of vision in one eye:     Problems with dizziness:         Gastrointestinal    Blood in stool:     Vomited blood:         Genitourinary    Burning when urinating:     Blood in urine:        Psychiatric    Major depression:         Hematologic    Bleeding problems:    Problems with blood clotting too easily:        Skin    Rashes or ulcers:        Constitutional    Fever or chills:      PHYSICAL EXAMINATION:  There were no vitals filed for this visit.  General:  WDWN in NAD; vital signs documented above Gait: Not observed HENT: WNL, normocephalic Pulmonary: normal non-labored breathing , without Rales, rhonchi,  wheezing Cardiac: {Desc; regular/irreg:14544} HR, without  Murmurs {With/Without:20273} carotid bruit*** Abdomen: soft, NT, no masses Skin: {With/Without:20273} rashes Vascular Exam/Pulses:  Right Left  Radial {Exam; arterial pulse strength 0-4:30167} {Exam; arterial pulse strength 0-4:30167}  Ulnar {Exam; arterial pulse strength 0-4:30167} {Exam; arterial pulse strength 0-4:30167}  Femoral {Exam; arterial pulse strength 0-4:30167} {Exam; arterial pulse strength 0-4:30167}  Popliteal {Exam; arterial pulse strength 0-4:30167} {Exam; arterial pulse strength 0-4:30167}  DP {Exam; arterial pulse strength 0-4:30167} {Exam; arterial pulse strength 0-4:30167}  PT {Exam; arterial pulse strength 0-4:30167} {Exam; arterial pulse strength 0-4:30167}   Extremities: {With/Without:20273}  ischemic changes, {With/Without:20273} Gangrene , {With/Without:20273} cellulitis; {With/Without:20273} open wounds;  Musculoskeletal: no muscle wasting or atrophy  Neurologic: A&O X 3;  No focal weakness or paresthesias are detected Psychiatric:  The pt has {Desc; normal/abnormal:11317::Normal} affect.   Non-Invasive Vascular Imaging:   ***    ASSESSMENT/PLAN:: 33 y.o. female presenting with ***   ***  Fonda FORBES Rim, MD [facility] [phone]

## 2024-03-05 ENCOUNTER — Ambulatory Visit: Attending: Vascular Surgery | Admitting: Vascular Surgery

## 2024-03-05 ENCOUNTER — Ambulatory Visit (HOSPITAL_COMMUNITY): Admission: RE | Admit: 2024-03-05 | Source: Ambulatory Visit

## 2024-04-15 ENCOUNTER — Ambulatory Visit: Admitting: Physician Assistant

## 2024-05-11 ENCOUNTER — Ambulatory Visit: Admitting: Physician Assistant

## 2024-10-07 ENCOUNTER — Institutional Professional Consult (permissible substitution): Admitting: Plastic Surgery
